# Patient Record
Sex: Female | Born: 1960 | Race: White | Hispanic: No | Marital: Single | Smoking: Former smoker
Health system: Southern US, Community
[De-identification: ages and names within clinical notes are randomized; demographics above are authoritative.]

## PROBLEM LIST (undated history)

## (undated) DIAGNOSIS — R51 Headache: Secondary | ICD-10-CM

## (undated) DIAGNOSIS — Z973 Presence of spectacles and contact lenses: Secondary | ICD-10-CM

## (undated) DIAGNOSIS — R519 Headache, unspecified: Secondary | ICD-10-CM

## (undated) DIAGNOSIS — K449 Diaphragmatic hernia without obstruction or gangrene: Secondary | ICD-10-CM

## (undated) DIAGNOSIS — F419 Anxiety disorder, unspecified: Secondary | ICD-10-CM

## (undated) DIAGNOSIS — C801 Malignant (primary) neoplasm, unspecified: Secondary | ICD-10-CM

## (undated) DIAGNOSIS — K219 Gastro-esophageal reflux disease without esophagitis: Secondary | ICD-10-CM

## (undated) DIAGNOSIS — D649 Anemia, unspecified: Secondary | ICD-10-CM

## (undated) DIAGNOSIS — K5790 Diverticulosis of intestine, part unspecified, without perforation or abscess without bleeding: Secondary | ICD-10-CM

## (undated) DIAGNOSIS — IMO0001 Reserved for inherently not codable concepts without codable children: Secondary | ICD-10-CM

## (undated) HISTORY — PX: OTHER SURGICAL HISTORY: SHX169

## (undated) HISTORY — PX: WISDOM TOOTH EXTRACTION: SHX21

## (undated) HISTORY — DX: Diverticulosis of intestine, part unspecified, without perforation or abscess without bleeding: K57.90

---

## 1999-01-11 ENCOUNTER — Encounter: Admission: RE | Admit: 1999-01-11 | Discharge: 1999-04-11 | Payer: Self-pay | Admitting: Anesthesiology

## 2002-10-13 ENCOUNTER — Other Ambulatory Visit: Admission: RE | Admit: 2002-10-13 | Discharge: 2002-10-13 | Payer: Self-pay | Admitting: Obstetrics and Gynecology

## 2002-10-27 ENCOUNTER — Ambulatory Visit (HOSPITAL_COMMUNITY): Admission: RE | Admit: 2002-10-27 | Discharge: 2002-10-27 | Payer: Self-pay | Admitting: Obstetrics and Gynecology

## 2002-10-27 ENCOUNTER — Encounter: Payer: Self-pay | Admitting: Obstetrics and Gynecology

## 2002-10-28 ENCOUNTER — Encounter: Payer: Self-pay | Admitting: Obstetrics and Gynecology

## 2002-10-28 ENCOUNTER — Encounter: Admission: RE | Admit: 2002-10-28 | Discharge: 2002-10-28 | Payer: Self-pay | Admitting: Obstetrics and Gynecology

## 2003-06-10 HISTORY — PX: VAGINAL HYSTERECTOMY: SUR661

## 2003-08-22 ENCOUNTER — Encounter (INDEPENDENT_AMBULATORY_CARE_PROVIDER_SITE_OTHER): Payer: Self-pay | Admitting: Specialist

## 2003-08-22 ENCOUNTER — Observation Stay (HOSPITAL_COMMUNITY): Admission: RE | Admit: 2003-08-22 | Discharge: 2003-08-23 | Payer: Self-pay | Admitting: Obstetrics and Gynecology

## 2003-09-08 ENCOUNTER — Inpatient Hospital Stay (HOSPITAL_COMMUNITY): Admission: EM | Admit: 2003-09-08 | Discharge: 2003-09-12 | Payer: Self-pay | Admitting: Obstetrics and Gynecology

## 2003-12-22 ENCOUNTER — Ambulatory Visit (HOSPITAL_COMMUNITY): Admission: RE | Admit: 2003-12-22 | Discharge: 2003-12-22 | Payer: Self-pay | Admitting: Family Medicine

## 2005-06-09 HISTORY — PX: CHOLECYSTECTOMY: SHX55

## 2006-10-23 ENCOUNTER — Ambulatory Visit (HOSPITAL_COMMUNITY): Admission: RE | Admit: 2006-10-23 | Discharge: 2006-10-23 | Payer: Self-pay | Admitting: Surgery

## 2006-10-23 ENCOUNTER — Encounter (INDEPENDENT_AMBULATORY_CARE_PROVIDER_SITE_OTHER): Payer: Self-pay | Admitting: Surgery

## 2010-10-22 NOTE — Op Note (Signed)
Brianna Vasquez, Brianna Vasquez                ACCOUNT NO.:  [id number]   MEDICAL RECORD NO.:  [id number]          PATIENT TYPE:  AMB   LOCATION:  DAY                          FACILITY:  [facility]   PHYSICIAN:  Ardeth Sportsman, MD     DATE OF BIRTH:  [birth year]   DATE OF PROCEDURE:  10/23/2006  DATE OF DISCHARGE:                               OPERATIVE REPORT   Primary care physician is Tammy R. Collins Scotland, M.D.   SURGEON:  Ardeth Sportsman, MD   ASSISTANT:  None.   PREOPERATIVE DIAGNOSIS:  Symptomatic cholecystolithiasis.   POSTOPERATIVE DIAGNOSES:  1. Symptomatic cholecystolithiasis.  2. Possible chronic cholecystitis.   PROCEDURE PERFORMED:  Laparoscopic cholecystectomy with intraoperative  cholangiogram.   ANESTHESIA:  1. General anesthesia.  2. Local anesthetic in a field block around all port sites.   SPECIMENS:  Gallbladder.   DRAINS:  None.   ESTIMATED BLOOD LOSS:  Less than 10 mL.   COMPLICATIONS:  There might have been a subtle serosal burn on the  duodenum.  This was controlled with a figure-of-eight 3-0 Vicryl  imbricating stitch.   INDICATIONS:  [name] is a 50 year old female who has had classic  story for biliary colic and known gallstones.  The anatomy and  physiology of hepatobiliary and pancreatic function was discussed.  The  pathophysiology of symptomatic cholecystolithiasis with its risks of  cholecystitis, gallstone pancreatitis, choledocholithiasis and other  elements were discussed.  Options were discussed and the recommendation  was made for laparoscopic cholecystectomy with intraoperative  cholangiogram.   Risks such as stroke, heart attack, deep venous thrombosis, pulmonary  embolism and death, were discussed.  Risks such as bleeding, need for  transfusion, wound infection, abscess, injury to other organs,  incisional hernia, were discussed.  Risk of bile duct injury with the  need of operative reconstruction, need for internal/external drainage  and other  risks were discussed.  Questions answered and she agreed to  proceed.   OPERATIVE FINDINGS:  She had some moderate omental adhesions and her  duodenum was closely involved with the gallbladder.  Her cholangiogram  was completely normal.  Her gallbladder wall was not massively  thickened.   DESCRIPTION OF PROCEDURE:  Informed consent was confirmed.  The patient  voided just prior to going to the operating room.  She had sequential  compression devices active during the entire case.  She underwent  general anesthesia without difficulty.  She was positioned supine with  both arms tucked.  Her abdomen was prepped and draped in a sterile  fashion.   The patient was positioned in steep reverse Trendelenburg, right side  up.  Abdominal entry was gained through optical entry using a 5 mm 30  degree scope.  Capnoperitoneum to 15 mmHg provided good abdominal  insufflation.  Camera inspection revealed no intra-abdominal injury.  Under direct visualization 5 mm ports were placed in the right flank and  through the umbilicus.  A 10.0 port was placed in the subxiphoid region,  tunneled through the falciform ligament.   The gallbladder fundus was identified, grabbed and laid cephalad toward  the  right shoulder.  Omental attachments were freed primarily with blunt  dissection but there were some dense adhesions that needed a little bit  of cautery as well.  At one point during cautery, the tissues shrunk and  the duodenum was brought up close to it.  There might have been a slight  blanching of the serosa wall.  It was subsequently difficult to tell if  there was definitely a burn injury but just to be sure, I went ahead and  did using laparoscopic intracorporeal suturing and knot-tying, did a 3-0  Vicryl figure-of-eight stitch to help patch the area and protect it by  imbricating the duodenum.  This seemed to help cover up the area well.  There was no leak of blood or bile.   The anteromedial and  posterolateral peritoneal coverage between the  gallbladder and liver were freed off.  A circumferential dissection was  done until only two structures were going from the gallbladder down to  the porta hepatis.  One had a wide branching on it and pulsatile  consistent with the cystic artery.  The other one tapered from the  infundibulum and went straight down into the common bile duct.  This was  the cystic duct.  One clip on the gallbladder side and two clips on the  proximal were made on the cystic artery, and this was transected.  Further skeletonization was done until only the cystic duct remained.  One clip was made on the infundibulum.  A partial cyst ductotomy was  performed.   A 5 French cholangiogram catheter was placed through a right subcostal  stab incision, flushed, and passed into the cystic duct more proximally  toward the common bile duct.  It flushed well.  A cholangiogram was done  using continuous fluoroscopy.  This was done using diluted radiopaque  contrast.  Contrast flowed easily in through a side branch into the  common bile duct up into the right and left intrahepatic chains and down  past the ampulla into the duodenum.  There was no filling defect or  other abnormalities.  This was a normal cholangiogram.  The catheter was  removed.  Three clips were planned on the cystic duct just slightly  proximal to the cyst ductotomy and the cyst ductotomy transection was  completed.   The gallbladder was freed from its many attachments on the liver bed and  removed out the subxiphoid port.  A careful inspection was done and a  nice hemostasis was ensured.  Clips were inspected and nice and intact.  The duodenum was reinspected and again the tissue looked all nice and  viable with no evidence of any significant ash or burn or leak.  Irrigation was done until there was a nice clear return.  The upper three abdominal ports were removed.  The subxiphoid fascial defect was   too small to allow my pinkie to pass, plus it was tunneled within the  falciform ligament, so I felt that it did not require a more aggressive  closure.  Capnoperitoneum was evacuated and the ports were removed.  The  skin was closed using 4-0 Monocryl stitch.   The patient was extubated and sent to the recovery room in stable  condition.  I explained the operative findings to the patient's husband  and family.  Questions were answered and they expressed understanding  and appreciation.  I will have her follow up with me in a couple of  weeks.      Viviann Spare  Dierdre Forth, MD  Electronically Signed     SCG/MEDQ  D:  10/23/2006  T:  10/23/2006  Job:  [id number]   cc:   Tammy R. Collins Scotland, M.D.  Fax: [phone]

## 2010-10-25 NOTE — Discharge Summary (Signed)
NAMECARLOTA, Brianna Vasquez                          ACCOUNT NO.:  [id number]   MEDICAL RECORD NO.:  [id number]                   PATIENT TYPE:  INP   LOCATION:  0481                                 FACILITY:  [facility]   PHYSICIAN:  Laqueta Linden, M.D.                 DATE OF BIRTH:  1960/09/18   DATE OF ADMISSION:  09/08/2003  DATE OF DISCHARGE:  09/12/2003                                 DISCHARGE SUMMARY   CONDITION ON DISCHARGE:  Improved.   DISCHARGE DIAGNOSIS:  Pelvic abscess, clinically responding to treatment.   SECONDARY DIAGNOSES:  1. Status post vaginal hysterectomy on August 22, 2003.  2. Known lumbar spine disk disease.  3. Hiatal hernia with reflux.   PROCEDURES:  1. Pelvic CT scan x2.  2. Barium swallow.   TRANSFUSIONS:  Two units of packed red blood cells.   CONSULTANTS:  1. Pharmacy re-anticoagulation for suspected pelvic thrombophlebitis.  2. Telephone consult with Dr. Charna Elizabeth, gastroenterology.   HISTORY OF PRESENT ILLNESS:  Brianna Vasquez is a 50 year old, gravida 2, para  2, who underwent an uncomplicated vaginal hysterectomy on August 22, 2003 as  an outpatient at [facility] for menorrhagia.  She had an  uncomplicated postoperative course until August 28, 2003, when she presented  with fever and lower abdominal discomfort.  She was noted to have a 3.5 cm  cuff cystic mass, consistent with a small abscess, and was placed on p.o.  Cipro.  She had marked improvement of her symptoms with normalization of her  temperatures and decrease in pain.  She subsequently had a flare of her  symptoms, and spiked fevers to 102 and 103 after stopping the Cipro after 10  days.  She was seen and evaluated by Dr. Kyra Manges, and was admitted for  further inpatient management.   HOSPITAL COURSE:  She was admitted and underwent a pelvic CT scan, which  confirmed the presence of an abscess that did not appear to be appreciably  larger than what it was on the prior  ultrasound.  She did have some edema  around the rectosigmoid, consistent with the location of the abscess.  There  were no other abnormalities identified.  She was put on IV Unasyn.  She was  also started on Lovenox for the possibility of septic pelvic  thrombophlebitis.  Her temperatures gradually defervesced over the next 48  hours.  On hospital day #3, she was feeling much better, and maximum  temperature elevations were 100.8.  Her belly was soft and nontender,  although pelvic examination was not repeated.  She did have a follow up CT  scan which showed an actual decrease in the size of the abscess, although  the inflammatory changes of the surrounding soft tissues had increased  somewhat.  Her CBC was monitored, and her white count, which had been 22 on  admission, normalized to a white count of  10.0 on the day of discharge.  She  maintained a thrombocytosis, which was felt to be related to acute  infection.  Her hemoglobin remained stable, around 8; however, she reported  that she felt very weak and dizzy when menstrual period to ambulate, and her  pulse remained in the 100 to 110 range.  After extensive discussion with the  patient about risks and benefits, she elected to proceed with transfusion,  and received 2 units of packed red blood cells without incident.  Follow up  hemoglobin revealed a hemoglobin of 10.0, hematocrit of 30.7.  Platelets  remained elevated at 893, and this was anticipated to revert to normal upon  further diminution in her pelvic inflammation.  On postoperative day #4, she  was actually doing very well, had a temperature less than 99.7 for greater  than 24 hours, her pulse remained less than 100, and she was able to  ambulate.  However, she was quite concerned about difficulty swallowing and  feeling like there was something stuck in her throat.  After a phone  conversation with Dr. Charna Elizabeth, the patient underwent a barium swallow  which showed no  evidence of obstruction, edema, spasm, or inflammation, but  did show a significant hiatal hernia with reflux.  Dr. Loreta Ave recommended that  the patient be started on Protonix with follow up as an outpatient.  In the  meantime, the patient has been switched to liquid Augmentin, as well as  liquid Lortab elixir for her pain, and was tolerating those, as well as  ambulating and tolerating a liquid and a soft diet.  She was therefore sent  home the afternoon of September 12, 2003 to continue a 10-day course of  Augmentin, to continue her Protonix, and with GI follow up to be arranged as  an outpatient.   CONDITION ON DISCHARGE:  She was discharged home in improved condition.   DISCHARGE INSTRUCTIONS:  She was given routine verbal and written discharge  instructions.   FOLLOW UP:  She is to follow up in the office in 3 days time.  She is to  follow up in the office sooner for worsening pain, fever, bleeding, or  inability to tolerate her oral liquid medications or any other concerns.   MEDICATIONS:  Prescriptions were called to her pharmacy including -  1. Augmentin suspension 900 mg q.12h. for 10 days.  2. Protonix 40 mg daily.  3. Lortab elixir, dispense 200 cc, 1-2 teaspoons q.4-6h. p.r.n. pain with 1     refill.                                               Laqueta Linden, M.D.    LKS/MEDQ  D:  09/27/2003  T:  09/27/2003  Job:  [id number]

## 2010-10-25 NOTE — Op Note (Signed)
NAME:  Brianna, Sadiyah Vasquez                          ACCOUNT NO.:  [id number]   MEDICAL RECORD NO.:  [id number]                   PATIENT TYPE:  OBV   LOCATION:  9308                                 FACILITY:  [facility]   PHYSICIAN:  Laqueta Linden, M.D.                 DATE OF BIRTH:  1960-07-31   DATE OF PROCEDURE:  08/22/2003  DATE OF DISCHARGE:                                 OPERATIVE REPORT   PREOPERATIVE DIAGNOSES:  1. Submucosal fibroid/endometrial polyp.  2. Menorrhagia.  3. Anemia.   POSTOPERATIVE DIAGNOSES:  1. Submucosal fibroid/endometrial polyp.  2. Menorrhagia.  3. Anemia.   PROCEDURE:  Transvaginal hysterectomy with coring, morcellation, myomectomy  (300 g).   SURGEON:  Laqueta Linden, M.D.   ASSISTANT:  Andres Ege, M.D.   ANESTHESIA:  General endotracheal.   ESTIMATED BLOOD LOSS:  Less than 100 mL.   FLUIDS:  1 liter of crystalloid.   URINE OUTPUT:  25 mL in-and-out catheterization at the conclusion of the  procedure.   COUNTS:  Correct x 2.   COMPLICATIONS:  None.   INDICATIONS:  Brianna Vasquez is a 50 year old, gravida 3, para 2, female with  profuse menorrhagia with anemia, noted to have a 4 cm endometrial polyp and  a large 6 cm fibroid, felt to be the source of her bleeding.  Attempted  medical management with combination pills, progesterone only pills, and high  dose Megace was unsuccessful.  The patient presents now for definitive  surgical management.  She has been extensively counseled and seen the  informed consent film regarding risks, benefits, alternatives, and  complications of the procedure including but not limited to anesthesia risk;  infection; bleeding possibly requiring transfusion; injury to bowel,  bladder, ureters, vessels, nerves; the possibility of fistula formation;  other postoperative complications including DVT; PE; pneumonia; other  unknown risks as well as permanent and irreversible sterilization; the  possibility for  removal of one or both ovaries depending on intraoperative  findings; and possible need for a laparotomy to complete the procedure due  to the large fibroid as well as postoperative expectations regarding return  to work, sexual functioning, and natural menopause if her ovaries are left  in place.  All questions were answered.  Full consent was given, and she  received Ancef 1 g IV 30 minutes preoperatively.   DESCRIPTION OF PROCEDURE:  The patient was taken to the operating room and  after proper identification and consents were ascertained, she was placed on  the operating room table in the supine position.  Prior to induction of  general endotracheal anesthesia, her legs were positioned in the Union  stirrups and were elevated for the surgery.  The patient remained  comfortable throughout.  This was done in this fashion, as the patient has a  history of herniated disk at L4-5.  Once the patient was positioned  appropriately and comfortably, she  was then placed under general  endotracheal anesthesia.  The perineum and vagina were prepped and draped in  a routine sterile fashion, and the bladder was emptied with a red rubber  catheter.  A weighted speculum was placed in the posterior vagina and the  cervix grasped with a single-tooth tenaculum.  The portio was then injected  with 16 mL of 1:100,000 epinephrine.  The cervix was then circumscribed with  a scalpel, and the vaginal mucosa advanced off of the cervix using a finger  to Ray-Tec sponge.  The posterior vaginal mucosa was then tented down and  the cul-de-sac entered sharply with curved Mayo scissors.  The uterosacral  ligaments were then clamped, cut, Heaney ligated and tagged with 0 Vicryl  suture.  Cardinal ligaments were similarly clamped, cut, and suture ligated  with 0 Vicryl.  The anterior peritoneal reflection was then identified and  entered sharply without obvious injury or entry into the bladder.  Uterine  vessels were  then clamped bilaterally, incorporating both the anterior and  posterior peritoneum with pedicles cut and suture ligated.  At this point,  it was felt that the large fundal fibroid was obstructions further trine  descent as the vessels had been already ligated.  A coring procedure was  then performed.  The edges of the lower uterine segment were grasped with  Kocher clamps as the cervix was cored out of the uterine body.  At this  point, a large fibroid protruded into the uterine fundus, virtually filling  the fundus.  Morcellation and myomectomy was then performed with marked  debulking of the uterine fundus.  At the conclusion of the evacuation of the  fibroid, curved Heaney clamps were then placed across the proximal uterine  ovarian ligament, fallopian tube, and round ligament with excision of the  specimen.  The specimen was immediately weighed in the operating room and  weighed 300 g.  It was sent to pathology for final sectioning.  The tubes  and ovaries appeared normal bilaterally.  The adnexal pedicles were doubly  ligated with a free tie and then a stitch of 0 Vicryl.  Hemostasis was noted  to be excellent.  There was some oozing from the posterior vaginal cuff.  The posterior vaginal cuff was then roofed to the posterior peritoneum in a  running lock fashion using a 0 Vicryl suture with an excellent hemostasis  noted.  A McCall suture was then placed for the uterosacral ligaments with  plication of the posterior cul-de-sac peritoneum and out through the  opposite uterosacral ligament.  This was tied at the very end of the case to  prevent enterocele formation.  The parietal peritoneum was then closed in a  pursestring fashion using 2-0 Vicryl suture.  The adnexal pedicles were  visualized and hemostatic, and the pedicles were then cut and released into  the peritoneal cavity.  There was a slight amount of oozing from both lateral aspects of the cuff after closure of the peritoneal  cavity.  Counts  were correct prior to closure of the peritoneum.  The vaginal cuff was then  closed from side-to-side using interrupted figure-of-eight sutures of 0  Vicryl.  The slight amount of bleeding noted stopped with placement of  appropriate sutures.  McCall suture was then placed.  Hemostasis was noted  to be excellent.  No packing was left in place. Bladder was then emptied of  25 mL of clear urine that had accumulated since the original catheterization  45 minutes previously.  The patient received 1 liter of crystalloid  throughout the procedure.  Estimated blood loss less than 100 mL.  Complications none.  Counts correct x 2.  She was stable and extubated on  transfer to the PACU.  She will be observed and transferred to the floor for  23 hour observation.  She received Toradol 30 mg IV, 30 mg IM prior to  awakening.                                               Laqueta Linden, M.D.    LKS/MEDQ  D:  08/22/2003  T:  08/22/2003  Job:  [id number]

## 2010-10-25 NOTE — H&P (Signed)
NAME:  Brianna Vasquez, Brianna Vasquez                          ACCOUNT NO.:  [id number]   MEDICAL RECORD NO.:  [id number]                   PATIENT TYPE:  INP   LOCATION:  0481                                 [facility]ACILITY[facility]   PHYSICIAN:  Katherine Roan, M.D.               DATE OF BIRTH:  07-21-1960   DATE OF ADMISSION:  09/08/2003  DATE OF DISCHARGE:                                HISTORY & PHYSICAL   CHIEF COMPLAINT:  Abdominal pain.   HISTORY OF PRESENT ILLNESS:  Brianna Vasquez is a 50 year old, gravida 2, para 2  female who underwent a vaginal hysterectomy at [facility] on March 15.  Surgery was done for menorrhagia persistent despite hormonal manipulation.  At the time of surgery, there was a large uterine fibroid and the uterus had  to be morcellated to be removed. This was done without incident.  She was  doing well until on August 28, 2003 and was seen in the office with fever and  lower abdominal discomfort, was placed on Cipro and did well until last  night when she started having chills and fever and low back pain that just  did not feel good.  She describes an aching in her lower pelvis. She denies  nausea, vomiting or diarrhea. She states her bowel movements are  uncomfortable since her surgery.  She does not complain of dysuria and her  appetites are not good.   PAST MEDICAL HISTORY:  She had two uncomplicated births, Pap smears have  been normal.   REVIEW OF SYMPTOMS:  HEENT:  No dizziness, headaches, no decreased vision or  auditory acuity.  PULMONARY:  No chronic cough, no asthma. CARDIOVASCULAR:  No hypertension, no chest pain, no history of mitral valve prolapse. GU:  She denies stress incontinence.  GI:  No bowel habit change, no melena.   FAMILY HISTORY:  Not remarkable for diabetes or malignant disease. Her  mother is living and well. Her mother was a Producer, television/film/video.   PHYSICAL EXAMINATION:  VITAL SIGNS:  Reveals a temperature os 100.6.  Pulse  is 120, blood pressure is  130/80.  GENERAL:  Pleasant, well-developed, well-nourished female who is alert and  oriented to time, place and recent vents.  HEENT:  Unremarkable.  Oropharynx is not injected.  NECK:  Supple. Carotid pulses are equal without bruits.  LUNGS:  Clear to auscultation and percussion.  HEART:  Normal sinus rhythm, no murmurs.  BREASTS:  No masses or tenderness.  ABDOMEN:  Soft.  Liver, spleen and kidneys are not palpated. There is no  tenderness or guarding, no rebound. Bowel sounds are appropriate.  EXTREMITIES:  Show good range of motion, equal pulses and reflexes.  PELVIC:  Reveals induration in the pelvis and tenderness. I cannot palpate  an adnexal mass.   IMPRESSION:  Probable pelvic cellulitis versus abscess.   PLAN:  IV antibiotics and CT of abdomen.  Katherine Roan, M.D.    SDM/MEDQ  D:  09/08/2003  T:  09/09/2003  Job:  [id number]   cc:   Laqueta Linden, M.D.  [address]  [place]  [place] [address]  Fax: [phone]

## 2011-01-17 ENCOUNTER — Emergency Department (HOSPITAL_COMMUNITY): Payer: Self-pay

## 2011-01-17 ENCOUNTER — Emergency Department (HOSPITAL_COMMUNITY)
Admission: EM | Admit: 2011-01-17 | Discharge: 2011-01-17 | Disposition: A | Discharge status: Home / Self Care | Payer: Self-pay | Attending: Emergency Medicine | Admitting: Emergency Medicine

## 2011-01-17 DIAGNOSIS — K219 Gastro-esophageal reflux disease without esophagitis: Secondary | ICD-10-CM | POA: Insufficient documentation

## 2011-01-17 DIAGNOSIS — R110 Nausea: Secondary | ICD-10-CM | POA: Insufficient documentation

## 2011-01-17 DIAGNOSIS — R0789 Other chest pain: Secondary | ICD-10-CM | POA: Insufficient documentation

## 2011-01-17 DIAGNOSIS — R0602 Shortness of breath: Secondary | ICD-10-CM | POA: Insufficient documentation

## 2011-01-17 LAB — COMPREHENSIVE METABOLIC PANEL
AST: 17 U/L (ref 0–37)
Albumin: 3.9 g/dL (ref 3.5–5.2)
Alkaline Phosphatase: 71 U/L (ref 39–117)
BUN: 10 mg/dL (ref 6–23)
Creatinine, Ser: 0.61 mg/dL (ref 0.50–1.10)
GFR calc non Af Amer: >60 mL/min (ref >60)
Total Bilirubin: 0.5 mg/dL (ref 0.3–1.2)

## 2011-01-17 LAB — CBC
Hemoglobin: 13.7 g/dL (ref 12.0–15.0)
MCH: 30.4 pg (ref 26.0–34.0)
MCHC: 33.9 g/dL (ref 30.0–36.0)
MCV: 89.6 fL (ref 78.0–100.0)
Platelets: 403 10*3/uL — ABNORMAL HIGH (ref 150–400)
RBC: 4.51 MIL/uL (ref 3.87–5.11)

## 2013-09-05 ENCOUNTER — Other Ambulatory Visit: Payer: Self-pay

## 2013-09-05 DIAGNOSIS — Z1231 Encounter for screening mammogram for malignant neoplasm of breast: Secondary | ICD-10-CM

## 2013-09-13 ENCOUNTER — Ambulatory Visit: Payer: Self-pay

## 2013-09-13 ENCOUNTER — Other Ambulatory Visit: Payer: Self-pay | Admitting: Family Medicine

## 2013-09-13 ENCOUNTER — Ambulatory Visit
Admission: RE | Admit: 2013-09-13 | Discharge: 2013-09-13 | Disposition: A | Discharge status: Home / Self Care | Payer: BC Managed Care – PPO | Source: Ambulatory Visit

## 2013-09-13 DIAGNOSIS — N6310 Unspecified lump in the right breast, unspecified quadrant: Secondary | ICD-10-CM

## 2013-09-13 DIAGNOSIS — Z1231 Encounter for screening mammogram for malignant neoplasm of breast: Secondary | ICD-10-CM

## 2013-09-20 ENCOUNTER — Other Ambulatory Visit: Payer: Self-pay | Admitting: Radiology

## 2013-10-07 ENCOUNTER — Ambulatory Visit (INDEPENDENT_AMBULATORY_CARE_PROVIDER_SITE_OTHER): Payer: BC Managed Care – PPO | Admitting: General Surgery

## 2013-10-07 ENCOUNTER — Encounter (INDEPENDENT_AMBULATORY_CARE_PROVIDER_SITE_OTHER): Payer: Self-pay | Admitting: General Surgery

## 2013-10-07 VITALS — BP 154/94 | HR 84 | Temp 97.8°F | Resp 12 | Ht 65.0 in | Wt 200.8 lb

## 2013-10-07 DIAGNOSIS — N63 Unspecified lump in unspecified breast: Secondary | ICD-10-CM

## 2013-10-07 HISTORY — PX: BREAST LUMPECTOMY: SHX2

## 2013-10-07 NOTE — Progress Notes (Signed)
Patient ID: Brianna Vasquez, female   DOB: [birth year], 53 y.o.   MRN: [id number]  Chief Complaint  Patient presents with  . New Evaluation    eval Rt Br papilloma    HPI Brianna Vasquez is a 53 y.o. female.  Referred by Dr Florina Ou HPI 75 yof who underwent screening mm that showed a 2 cm irregular mass with a macrolobulated margin in the right breast at 5 oclock. Breast density category B.  US shows a 2.1 cm lobulated partially solid mass.  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  She has family history of a grandmother, aunt and niece who all have had early breast cancer and the niece she believes was brca 1 positive. She comes in today without any complaints for evaluation.  History reviewed. No pertinent past medical history.  Past Surgical History  Procedure Laterality Date  . Abdominal hysterectomy  2004  . Cholecystectomy  2007    Family History  Problem Relation Age of Onset  . Cancer Father 75    pancreatic  . Cancer Paternal Aunt 51    breast   . Cancer Paternal Grandmother 35    breast/mastectomy    Social History History  Substance Use Topics  . Smoking status: Former Smoker    Quit date: 10/07/1985  . Smokeless tobacco: Not on file  . Alcohol Use: Yes     Comment: occ    No Known Allergies  Current Outpatient Prescriptions  Medication Sig Dispense Refill  . b complex vitamins tablet Take 1 tablet by mouth daily.       No current facility-administered medications for this visit.    Review of Systems Review of Systems  Constitutional: Negative for fever, chills and unexpected weight change.  HENT: Negative for congestion, hearing loss, sore throat, trouble swallowing and voice change.   Eyes: Negative for visual disturbance.  Respiratory: Negative for cough and wheezing.   Cardiovascular: Negative for chest pain, palpitations and leg swelling.  Gastrointestinal: Negative for nausea, vomiting, abdominal pain, diarrhea, constipation, blood in  stool, abdominal distention and anal bleeding.  Genitourinary: Negative for hematuria, vaginal bleeding and difficulty urinating.  Musculoskeletal: Negative for arthralgias.  Skin: Negative for rash and wound.  Neurological: Negative for seizures, syncope and headaches.  Hematological: Negative for adenopathy. Does not bruise/bleed easily.  Psychiatric/Behavioral: Negative for confusion.    Blood pressure 154/94, pulse 84, temperature 97.8 F (36.6 C), resp. rate 12, height _0  (1.651 m), weight 200 lb 12.8 oz (91.082 kg).  Physical Exam Physical Exam  Vitals reviewed. Constitutional: She appears well-developed and well-nourished.  Neck: Neck supple.  Cardiovascular: Normal rate, regular rhythm and normal heart sounds.   Pulmonary/Chest: Effort normal and breath sounds normal. She has no wheezes. She has no rales. Right breast exhibits no inverted nipple, no nipple discharge, no skin change and no tenderness. Left breast exhibits no inverted nipple, no mass, no nipple discharge, no skin change and no tenderness.    Lymphadenopathy:    She has no cervical adenopathy.    Data Reviewed [employer] us/mm and path reviewed  Assessment    Ductal papilloma and mass Family history with brca positivity     Plan    Right breast mass seed guided excisional biopsy  Due to her family history and the presence of this 2 cm right breast mass we discussed an excisional biopsy using radioactive seed guidance. We discussed the indications to rule out a carcinoma. She understands this. We discussed the  risks and benefits of this today.  We will also pending results of surgery plan to refer to discuss with a genetics counselor       Rolm Bookbinder 10/07/2013, 11:43 AM

## 2013-10-19 ENCOUNTER — Encounter (HOSPITAL_BASED_OUTPATIENT_CLINIC_OR_DEPARTMENT_OTHER): Payer: Self-pay | Admitting: *Deleted

## 2013-10-19 NOTE — Progress Notes (Signed)
To come in for [facility] labs  

## 2013-10-24 ENCOUNTER — Encounter (INDEPENDENT_AMBULATORY_CARE_PROVIDER_SITE_OTHER): Payer: Self-pay

## 2013-10-24 ENCOUNTER — Encounter (HOSPITAL_BASED_OUTPATIENT_CLINIC_OR_DEPARTMENT_OTHER)
Admission: RE | Admit: 2013-10-24 | Discharge: 2013-10-24 | Disposition: A | Discharge status: Home / Self Care | Payer: BC Managed Care – PPO | Source: Ambulatory Visit | Attending: General Surgery | Admitting: General Surgery

## 2013-10-24 LAB — CBC WITH DIFFERENTIAL/PLATELET
BASOS ABS: 0.1 10*3/uL (ref 0.0–0.1)
BASOS PCT: 1 % (ref 0–1)
Eosinophils Absolute: 0.2 10*3/uL (ref 0.0–0.7)
Eosinophils Relative: 3 % (ref 0–5)
HCT: 38.5 % (ref 36.0–46.0)
Hemoglobin: 12.9 g/dL (ref 12.0–15.0)
Lymphocytes Relative: 32 % (ref 12–46)
Lymphs Abs: 2.4 10*3/uL (ref 0.7–4.0)
MCH: 30.3 pg (ref 26.0–34.0)
MCHC: 33.5 g/dL (ref 30.0–36.0)
MCV: 90.4 fL (ref 78.0–100.0)
Monocytes Absolute: 0.6 10*3/uL (ref 0.1–1.0)
Monocytes Relative: 8 % (ref 3–12)
NEUTROS PCT: 56 % (ref 43–77)
Neutro Abs: 4.2 10*3/uL (ref 1.7–7.7)
PLATELETS: 336 10*3/uL (ref 150–400)
RBC: 4.26 MIL/uL (ref 3.87–5.11)
RDW: 13.7 % (ref 11.5–15.5)
WBC: 7.5 10*3/uL (ref 4.0–10.5)

## 2013-10-24 LAB — BASIC METABOLIC PANEL
BUN: 9 mg/dL (ref 6–23)
CALCIUM: 9.2 mg/dL (ref 8.4–10.5)
CO2: 25 mEq/L (ref 19–32)
CREATININE: 0.64 mg/dL (ref 0.50–1.10)
Chloride: 103 mEq/L (ref 96–112)
GFR calc non Af Amer: >90 mL/min (ref >90)
Glucose, Bld: 103 mg/dL — ABNORMAL HIGH (ref 70–99)
POTASSIUM: 3.8 meq/L (ref 3.7–5.3)
Sodium: 141 mEq/L (ref 137–147)

## 2013-10-25 ENCOUNTER — Encounter (HOSPITAL_BASED_OUTPATIENT_CLINIC_OR_DEPARTMENT_OTHER)
Admission: RE | Disposition: A | Discharge status: Home / Self Care | Payer: Self-pay | Source: Ambulatory Visit | Attending: General Surgery

## 2013-10-25 ENCOUNTER — Ambulatory Visit (HOSPITAL_BASED_OUTPATIENT_CLINIC_OR_DEPARTMENT_OTHER)
Admission: RE | Admit: 2013-10-25 | Discharge: 2013-10-25 | Disposition: A | Discharge status: Home / Self Care | Payer: BC Managed Care – PPO | Source: Ambulatory Visit | Attending: General Surgery | Admitting: General Surgery

## 2013-10-25 ENCOUNTER — Encounter (HOSPITAL_BASED_OUTPATIENT_CLINIC_OR_DEPARTMENT_OTHER): Payer: BC Managed Care – PPO | Admitting: Anesthesiology

## 2013-10-25 ENCOUNTER — Encounter (HOSPITAL_BASED_OUTPATIENT_CLINIC_OR_DEPARTMENT_OTHER): Payer: Self-pay

## 2013-10-25 ENCOUNTER — Ambulatory Visit (HOSPITAL_BASED_OUTPATIENT_CLINIC_OR_DEPARTMENT_OTHER): Payer: BC Managed Care – PPO | Admitting: Anesthesiology

## 2013-10-25 DIAGNOSIS — D249 Benign neoplasm of unspecified breast: Secondary | ICD-10-CM

## 2013-10-25 DIAGNOSIS — Z803 Family history of malignant neoplasm of breast: Secondary | ICD-10-CM | POA: Insufficient documentation

## 2013-10-25 DIAGNOSIS — N6089 Other benign mammary dysplasias of unspecified breast: Secondary | ICD-10-CM

## 2013-10-25 DIAGNOSIS — Z6833 Body mass index (BMI) 33.0-33.9, adult: Secondary | ICD-10-CM | POA: Insufficient documentation

## 2013-10-25 DIAGNOSIS — K219 Gastro-esophageal reflux disease without esophagitis: Secondary | ICD-10-CM | POA: Insufficient documentation

## 2013-10-25 DIAGNOSIS — Z87891 Personal history of nicotine dependence: Secondary | ICD-10-CM | POA: Insufficient documentation

## 2013-10-25 HISTORY — DX: Presence of spectacles and contact lenses: Z97.3

## 2013-10-25 SURGERY — RADIOACTIVE SEED GUIDED BREAST BIOPSY
Anesthesia: General | Site: Breast | Laterality: Right

## 2013-10-25 MED ORDER — PROMETHAZINE HCL 25 MG/ML IJ SOLN
INTRAMUSCULAR | Status: AC
  Filled 2013-10-25: qty 1

## 2013-10-25 MED ORDER — DEXAMETHASONE SODIUM PHOSPHATE 4 MG/ML IJ SOLN
INTRAMUSCULAR | Status: DC | PRN
  Administered 2013-10-25: 10 mg via INTRAVENOUS

## 2013-10-25 MED ORDER — FENTANYL CITRATE 0.05 MG/ML IJ SOLN
INTRAMUSCULAR | Status: AC
  Filled 2013-10-25: qty 6

## 2013-10-25 MED ORDER — MEPERIDINE HCL 25 MG/ML IJ SOLN: 6.2500 mg | INTRAMUSCULAR | Status: DC | PRN

## 2013-10-25 MED ORDER — BUPIVACAINE HCL (PF) 0.25 % IJ SOLN
INTRAMUSCULAR | Status: DC | PRN
  Administered 2013-10-25: 10 mL

## 2013-10-25 MED ORDER — MIDAZOLAM HCL 2 MG/2ML IJ SOLN
INTRAMUSCULAR | Status: AC
  Filled 2013-10-25: qty 2

## 2013-10-25 MED ORDER — FENTANYL CITRATE 0.05 MG/ML IJ SOLN: 50.0000 ug | INTRAMUSCULAR | Status: DC | PRN

## 2013-10-25 MED ORDER — PROPOFOL 10 MG/ML IV BOLUS
INTRAVENOUS | Status: DC | PRN
  Administered 2013-10-25: 200 mg via INTRAVENOUS

## 2013-10-25 MED ORDER — HYDROCODONE-ACETAMINOPHEN 10-325 MG PO TABS: 1.0000 | ORAL_TABLET | Freq: Four times a day (QID) | ORAL | Status: AC | PRN

## 2013-10-25 MED ORDER — MIDAZOLAM HCL 5 MG/5ML IJ SOLN
INTRAMUSCULAR | Status: DC | PRN
  Administered 2013-10-25: 2 mg via INTRAVENOUS

## 2013-10-25 MED ORDER — HYDROCODONE-ACETAMINOPHEN 5-325 MG PO TABS
1.0000 | ORAL_TABLET | Freq: Four times a day (QID) | ORAL | Status: DC | PRN
  Administered 2013-10-25: 1 via ORAL

## 2013-10-25 MED ORDER — PROMETHAZINE HCL 25 MG/ML IJ SOLN
6.2500 mg | INTRAMUSCULAR | Status: DC | PRN
  Administered 2013-10-25: 12.5 mg via INTRAVENOUS

## 2013-10-25 MED ORDER — HYDROMORPHONE HCL PF 1 MG/ML IJ SOLN
0.2500 mg | INTRAMUSCULAR | Status: DC | PRN
  Administered 2013-10-25 (×2): 0.25 mg via INTRAVENOUS

## 2013-10-25 MED ORDER — MIDAZOLAM HCL 2 MG/2ML IJ SOLN: 0.5000 mg | Freq: Once | INTRAMUSCULAR | Status: DC | PRN

## 2013-10-25 MED ORDER — MIDAZOLAM HCL 2 MG/2ML IJ SOLN: 1.0000 mg | INTRAMUSCULAR | Status: DC | PRN

## 2013-10-25 MED ORDER — CEFAZOLIN SODIUM-DEXTROSE 2-3 GM-% IV SOLR
2.0000 g | INTRAVENOUS | Status: AC
  Administered 2013-10-25: 2 g via INTRAVENOUS

## 2013-10-25 MED ORDER — CEFAZOLIN SODIUM-DEXTROSE 2-3 GM-% IV SOLR
INTRAVENOUS | Status: AC
  Filled 2013-10-25: qty 50

## 2013-10-25 MED ORDER — HYDROCODONE-ACETAMINOPHEN 5-325 MG PO TABS
1.0000 | ORAL_TABLET | Freq: Once | ORAL | Status: DC
Start: 2013-10-25 — End: 2013-10-25

## 2013-10-25 MED ORDER — HYDROCODONE-ACETAMINOPHEN 5-325 MG PO TABS
ORAL_TABLET | ORAL | Status: AC
  Filled 2013-10-25: qty 1

## 2013-10-25 MED ORDER — ONDANSETRON HCL 4 MG/2ML IJ SOLN
INTRAMUSCULAR | Status: DC | PRN
  Administered 2013-10-25: 4 mg via INTRAVENOUS

## 2013-10-25 MED ORDER — FENTANYL CITRATE 0.05 MG/ML IJ SOLN
INTRAMUSCULAR | Status: DC | PRN
  Administered 2013-10-25: 100 ug via INTRAVENOUS
  Administered 2013-10-25 (×2): 50 ug via INTRAVENOUS

## 2013-10-25 MED ORDER — HYDROMORPHONE HCL PF 1 MG/ML IJ SOLN
INTRAMUSCULAR | Status: AC
  Filled 2013-10-25: qty 1

## 2013-10-25 MED ORDER — LIDOCAINE HCL (CARDIAC) 20 MG/ML IV SOLN
INTRAVENOUS | Status: DC | PRN
  Administered 2013-10-25: 50 mg via INTRAVENOUS

## 2013-10-25 MED ORDER — LACTATED RINGERS IV SOLN
INTRAVENOUS | Status: DC
  Administered 2013-10-25: 10 mL/h via INTRAVENOUS
  Administered 2013-10-25: 14:00:00 via INTRAVENOUS

## 2013-10-25 SURGICAL SUPPLY — 60 items
APPLIER CLIP 9.375 MED OPEN (MISCELLANEOUS)
BENZOIN TINCTURE PRP APPL 2/3 (GAUZE/BANDAGES/DRESSINGS) IMPLANT
BINDER BREAST LRG (GAUZE/BANDAGES/DRESSINGS) IMPLANT
BINDER BREAST MEDIUM (GAUZE/BANDAGES/DRESSINGS) IMPLANT
BINDER BREAST XLRG (GAUZE/BANDAGES/DRESSINGS) ×3 IMPLANT
BINDER BREAST XXLRG (GAUZE/BANDAGES/DRESSINGS) IMPLANT
BLADE 15 SAFETY STRL DISP (BLADE) IMPLANT
BLADE SURG 15 STRL LF DISP TIS (BLADE) ×1 IMPLANT
BLADE SURG 15 STRL SS (BLADE) ×2
CANISTER SUC SOCK COL 7IN (MISCELLANEOUS) IMPLANT
CANISTER SUCT 1200ML W/VALVE (MISCELLANEOUS) IMPLANT
CHLORAPREP W/TINT 26ML (MISCELLANEOUS) ×3 IMPLANT
CLIP APPLIE 9.375 MED OPEN (MISCELLANEOUS) IMPLANT
CLOSURE WOUND 1/2 X4 (GAUZE/BANDAGES/DRESSINGS) ×1
COVER MAYO STAND STRL (DRAPES) ×3 IMPLANT
COVER PROBE W GEL 5X96 (DRAPES) ×3 IMPLANT
COVER TABLE BACK 60X90 (DRAPES) ×3 IMPLANT
DECANTER SPIKE VIAL GLASS SM (MISCELLANEOUS) IMPLANT
DERMABOND ADVANCED (GAUZE/BANDAGES/DRESSINGS) ×2
DERMABOND ADVANCED .7 DNX12 (GAUZE/BANDAGES/DRESSINGS) ×1 IMPLANT
DEVICE DUBIN W/COMP PLATE 8390 (MISCELLANEOUS) ×3 IMPLANT
DRAPE PED LAPAROTOMY (DRAPES) ×3 IMPLANT
DRSG TEGADERM 4X4.75 (GAUZE/BANDAGES/DRESSINGS) IMPLANT
ELECT COATED BLADE 2.86 ST (ELECTRODE) ×3 IMPLANT
ELECT REM PT RETURN 9FT ADLT (ELECTROSURGICAL) ×3
ELECTRODE REM PT RTRN 9FT ADLT (ELECTROSURGICAL) ×1 IMPLANT
GLOVE BIO SURGEON STRL SZ 6.5 (GLOVE) ×2 IMPLANT
GLOVE BIO SURGEON STRL SZ7 (GLOVE) ×6 IMPLANT
GLOVE BIO SURGEONS STRL SZ 6.5 (GLOVE) ×1
GLOVE BIOGEL PI IND STRL 7.0 (GLOVE) ×1 IMPLANT
GLOVE BIOGEL PI IND STRL 7.5 (GLOVE) ×1 IMPLANT
GLOVE BIOGEL PI INDICATOR 7.0 (GLOVE) ×2
GLOVE BIOGEL PI INDICATOR 7.5 (GLOVE) ×2
GOWN STRL REUS W/ TWL LRG LVL3 (GOWN DISPOSABLE) ×2 IMPLANT
GOWN STRL REUS W/TWL LRG LVL3 (GOWN DISPOSABLE) ×4
KIT MARKER MARGIN INK (KITS) ×3 IMPLANT
NEEDLE HYPO 25X1 1.5 SAFETY (NEEDLE) ×3 IMPLANT
NS IRRIG 1000ML POUR BTL (IV SOLUTION) IMPLANT
PACK BASIN DAY SURGERY FS (CUSTOM PROCEDURE TRAY) ×3 IMPLANT
PENCIL BUTTON HOLSTER BLD 10FT (ELECTRODE) ×3 IMPLANT
SHEET MEDIUM DRAPE 40X70 STRL (DRAPES) IMPLANT
SLEEVE SCD COMPRESS KNEE MED (MISCELLANEOUS) ×3 IMPLANT
SPONGE GAUZE 4X4 12PLY STER LF (GAUZE/BANDAGES/DRESSINGS) IMPLANT
SPONGE LAP 4X18 X RAY DECT (DISPOSABLE) ×3 IMPLANT
STAPLER VISISTAT 35W (STAPLE) ×3 IMPLANT
STRIP CLOSURE SKIN 1/2X4 (GAUZE/BANDAGES/DRESSINGS) ×2 IMPLANT
SUT MNCRL AB 4-0 PS2 18 (SUTURE) IMPLANT
SUT MON AB 5-0 PS2 18 (SUTURE) ×3 IMPLANT
SUT SILK 2 0 SH (SUTURE) IMPLANT
SUT VIC AB 2-0 SH 27 (SUTURE) ×2
SUT VIC AB 2-0 SH 27XBRD (SUTURE) ×1 IMPLANT
SUT VIC AB 3-0 SH 27 (SUTURE) ×2
SUT VIC AB 3-0 SH 27X BRD (SUTURE) ×1 IMPLANT
SUT VIC AB 5-0 PS2 18 (SUTURE) IMPLANT
SYR CONTROL 10ML LL (SYRINGE) ×3 IMPLANT
TOWEL OR 17X24 6PK STRL BLUE (TOWEL DISPOSABLE) ×3 IMPLANT
TOWEL OR NON WOVEN STRL DISP B (DISPOSABLE) IMPLANT
TUBE CONNECTING 20'X1/4 (TUBING)
TUBE CONNECTING 20X1/4 (TUBING) IMPLANT
YANKAUER SUCT BULB TIP NO VENT (SUCTIONS) IMPLANT

## 2013-10-25 NOTE — Discharge Instructions (Signed)
[facility] °Office Phone Number [phone] ° °BREAST BIOPSY/ PARTIAL MASTECTOMY: POST OP INSTRUCTIONS ° °Always review your discharge instruction sheet given to you by the facility where your surgery was performed. ° °IF YOU HAVE DISABILITY OR FAMILY LEAVE FORMS, YOU MUST BRING THEM TO THE OFFICE FOR PROCESSING.  DO NOT GIVE THEM TO YOUR DOCTOR. ° °1. A prescription for pain medication may be given to you upon discharge.  Take your pain medication as prescribed, if needed.  If narcotic pain medicine is not needed, then you may take acetaminophen (Tylenol), naprosyn (Alleve) or ibuprofen (Advil) as needed. °2. Take your usually prescribed medications unless otherwise directed °3. If you need a refill on your pain medication, please contact your pharmacy.  They will contact our office to request authorization.  Prescriptions will not be filled after 5pm or on week-ends. °4. You should eat very light the first 24 hours after surgery, such as soup, crackers, pudding, etc.  Resume your normal diet the day after surgery. °5. Most patients will experience some swelling and bruising in the breast.  Ice packs and a good support bra will help.  Wear the breast binder provided or a sports bra for 72 hours day and night.  After that wear a sports bra during the day until you return to the office. Swelling and bruising can take several days to resolve.  °6. It is common to experience some constipation if taking pain medication after surgery.  Increasing fluid intake and taking a stool softener will usually help or prevent this problem from occurring.  A mild laxative (Milk of Magnesia or Miralax) should be taken according to package directions if there are no bowel movements after 48 hours. °7. Unless discharge instructions indicate otherwise, you may remove your bandages 48 hours after surgery and you may shower at that time.  You may have steri-strips (small skin tapes) in place directly over the incision.   These strips should be left on the skin for 7-10 days and will come off on their own.  If your surgeon used skin glue on the incision, you may shower in 24 hours.  The glue will flake off over the next 2-3 weeks.  Any sutures or staples will be removed at the office during your follow-up visit. °8. ACTIVITIES:  You may resume regular daily activities (gradually increasing) beginning the next day.  Wearing a good support bra or sports bra minimizes pain and swelling.  You may have sexual intercourse when it is comfortable. °a. You may drive when you no longer are taking prescription pain medication, you can comfortably wear a seatbelt, and you can safely maneuver your car and apply brakes. °b. RETURN TO WORK:  ______________________________________________________________________________________ °9. You should see your doctor in the office for a follow-up appointment approximately two weeks after your surgery.  Your doctor’s nurse will typically make your follow-up appointment when she calls you with your pathology report.  Expect your pathology report 3-4 business days after your surgery.  You may call to check if you do not hear from us after three days. °10. OTHER INSTRUCTIONS: _______________________________________________________________________________________________ _____________________________________________________________________________________________________________________________________ °_____________________________________________________________________________________________________________________________________ °_____________________________________________________________________________________________________________________________________ ° °WHEN TO CALL DR WAKEFIELD: °1. Fever over 101.0 °2. Nausea and/or vomiting. °3. Extreme swelling or bruising. °4. Continued bleeding from incision. °5. Increased pain, redness, or drainage from the incision. ° °The clinic staff is available to  answer your questions during regular business hours.  Please don’t hesitate to call and ask to speak to one of the nurses for   clinical concerns.  If you have a medical emergency, go to the nearest emergency room or call 911.  A surgeon from [facility] is always on call at the hospital. ° °For further questions, please visit [email or web address] ° °Post Anesthesia Home Care Instructions ° °Activity: °Get plenty of rest for the remainder of the day. A responsible adult should stay with you for 24 hours following the procedure.  °For the next 24 hours, DO NOT: °-Drive a car °-Operate machinery °-Drink alcoholic beverages °-Take any medication unless instructed by your physician °-Make any legal decisions or sign important papers. ° °Meals: °Start with liquid foods such as gelatin or soup. Progress to regular foods as tolerated. Avoid greasy, spicy, heavy foods. If nausea and/or vomiting occur, drink only clear liquids until the nausea and/or vomiting subsides. Call your physician if vomiting continues. ° °Special Instructions/Symptoms: °Your throat may feel dry or sore from the anesthesia or the breathing tube placed in your throat during surgery. If this causes discomfort, gargle with warm salt water. The discomfort should disappear within 24 hours. ° °

## 2013-10-25 NOTE — Interval H&P Note (Signed)
History and Physical Interval Note:  10/25/2013 2:31 PM  Brianna Vasquez  has presented today for surgery, with the diagnosis of right breast mass  The various methods of treatment have been discussed with the patient and family. After consideration of risks, benefits and other options for treatment, the patient has consented to  Procedure(s): RADIOACTIVE SEED GUIDED EXCISIONAL BREAST BIOPSY (Right) as a surgical intervention .  The patient's history has been reviewed, patient examined, no change in status, stable for surgery.  I have reviewed the patient's chart and labs.  Questions were answered to the patient's satisfaction.     Rolm Bookbinder

## 2013-10-25 NOTE — H&P (View-Only) (Signed)
Patient ID: Brianna Vasquez, female   DOB: 11/07/1960, 53 y.o.   MRN: [id number]  Chief Complaint  Patient presents with  . New Evaluation    eval Rt Br papilloma    HPI Brianna Vasquez is a 53 y.o. female.  Referred by Dr Tammy Spear HPI 53 yof who underwent screening mm that showed a 2 cm irregular mass with a macrolobulated margin in the right breast at 5 oclock. Breast density category B.  US shows a 2.1 cm lobulated partially solid mass.  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  She has family history of a grandmother, aunt and niece who all have had early breast cancer and the niece she believes was brca 1 positive. She comes in today without any complaints for evaluation.  History reviewed. No pertinent past medical history.  Past Surgical History  Procedure Laterality Date  . Abdominal hysterectomy  2004  . Cholecystectomy  2007    Family History  Problem Relation Age of Onset  . Cancer Father 45    pancreatic  . Cancer Paternal Aunt 48    breast   . Cancer Paternal Grandmother 55    breast/mastectomy    Social History History  Substance Use Topics  . Smoking status: Former Smoker    Quit date: 10/07/1985  . Smokeless tobacco: Not on file  . Alcohol Use: Yes     Comment: occ    No Known Allergies  Current Outpatient Prescriptions  Medication Sig Dispense Refill  . b complex vitamins tablet Take 1 tablet by mouth daily.       No current facility-administered medications for this visit.    Review of Systems Review of Systems  Constitutional: Negative for fever, chills and unexpected weight change.  HENT: Negative for congestion, hearing loss, sore throat, trouble swallowing and voice change.   Eyes: Negative for visual disturbance.  Respiratory: Negative for cough and wheezing.   Cardiovascular: Negative for chest pain, palpitations and leg swelling.  Gastrointestinal: Negative for nausea, vomiting, abdominal pain, diarrhea, constipation, blood in  stool, abdominal distention and anal bleeding.  Genitourinary: Negative for hematuria, vaginal bleeding and difficulty urinating.  Musculoskeletal: Negative for arthralgias.  Skin: Negative for rash and wound.  Neurological: Negative for seizures, syncope and headaches.  Hematological: Negative for adenopathy. Does not bruise/bleed easily.  Psychiatric/Behavioral: Negative for confusion.    Blood pressure 154/94, pulse 84, temperature 97.8 F (36.6 C), resp. rate 12, height 5' 5" (1.651 m), weight 200 lb 12.8 oz (91.082 kg).  Physical Exam Physical Exam  Vitals reviewed. Constitutional: She appears well-developed and well-nourished.  Neck: Neck supple.  Cardiovascular: Normal rate, regular rhythm and normal heart sounds.   Pulmonary/Chest: Effort normal and breath sounds normal. She has no wheezes. She has no rales. Right breast exhibits no inverted nipple, no nipple discharge, no skin change and no tenderness. Left breast exhibits no inverted nipple, no mass, no nipple discharge, no skin change and no tenderness.    Lymphadenopathy:    She has no cervical adenopathy.    Data Reviewed [employer] us/mm and path reviewed  Assessment    Ductal papilloma and mass Family history with brca positivity     Plan    Right breast mass seed guided excisional biopsy  Due to her family history and the presence of this 2 cm right breast mass we discussed an excisional biopsy using radioactive seed guidance. We discussed the indications to rule out a carcinoma. She understands this. We discussed the   risks and benefits of this today.  We will also pending results of surgery plan to refer to discuss with a genetics counselor       Brianna Vasquez 10/07/2013, 11:43 AM    

## 2013-10-25 NOTE — Anesthesia Postprocedure Evaluation (Signed)
  Anesthesia Post-op Note  Patient: Brianna Vasquez  Procedure(s) Performed: Procedure(s): RADIOACTIVE SEED GUIDED EXCISIONAL BREAST BIOPSY (Right)  Patient Location: PACU  Anesthesia Type:General  Level of Consciousness: awake, alert , oriented and patient cooperative  Airway and Oxygen Therapy: Patient Spontanous Breathing  Post-op Pain: none  Post-op Assessment: Post-op Vital signs reviewed, Patient's Cardiovascular Status Stable, Respiratory Function Stable, Patent Airway, No signs of Nausea or vomiting and Pain level controlled  Post-op Vital Signs: Reviewed and stable  Last Vitals:  Filed Vitals:   10/25/13 1645  BP: 152/83  Pulse: 86  Temp: 36.4 C  Resp: 16    Complications: No apparent anesthesia complications

## 2013-10-25 NOTE — Anesthesia Procedure Notes (Signed)
Procedure Name: LMA Insertion Date/Time: 10/25/2013 2:53 PM Performed by: Melynda Ripple D Pre-anesthesia Checklist: Patient identified, Emergency Drugs available, Suction available and Patient being monitored Patient Re-evaluated:Patient Re-evaluated prior to inductionOxygen Delivery Method: Circle System Utilized Preoxygenation: Pre-oxygenation with 100% oxygen Intubation Type: IV induction Ventilation: Mask ventilation without difficulty LMA: LMA inserted LMA Size: 4.0 Number of attempts: 1 Airway Equipment and Method: bite block Placement Confirmation: positive ETCO2 Tube secured with: Tape Dental Injury: Teeth and Oropharynx as per pre-operative assessment

## 2013-10-25 NOTE — Anesthesia Preprocedure Evaluation (Addendum)
Anesthesia Evaluation  Patient identified by MRN, date of birth, ID band Patient awake    Reviewed: Allergy & Precautions, H&P , NPO status , Patient's Chart, lab work & pertinent test results  History of Anesthesia Complications Negative for: history of anesthetic complications  Airway Mallampati: II TM Distance: >3 FB Neck ROM: Full    Dental  (+) Missing, Dental Advisory Given   Pulmonary former smoker (quit '87),  breath sounds clear to auscultation  Pulmonary exam normal       Cardiovascular - anginanegative cardio ROS  Rate:Normal     Neuro/Psych negative neurological ROS     GI/Hepatic Neg liver ROS, GERD-  Medicated and Controlled,  Endo/Other  Morbid obesity  Renal/GU negative Renal ROS     Musculoskeletal   Abdominal (+) + obese,   Peds  Hematology negative hematology ROS (+)   Anesthesia Other Findings   Reproductive/Obstetrics                          Anesthesia Physical Anesthesia Plan  ASA: II  Anesthesia Plan: General   Post-op Pain Management:    Induction: Intravenous  Airway Management Planned: LMA  Additional Equipment:   Intra-op Plan:   Post-operative Plan:   Informed Consent: I have reviewed the patients History and Physical, chart, labs and discussed the procedure including the risks, benefits and alternatives for the proposed anesthesia with the patient or authorized representative who has indicated his/her understanding and acceptance.   Dental advisory given  Plan Discussed with: CRNA and Surgeon  Anesthesia Plan Comments: (Plan routine monitors, GA- LMA OK)        Anesthesia Quick Evaluation

## 2013-10-25 NOTE — Transfer of Care (Signed)
Immediate Anesthesia Transfer of Care Note  Patient: Brianna Vasquez  Procedure(s) Performed: Procedure(s): RADIOACTIVE SEED GUIDED EXCISIONAL BREAST BIOPSY (Right)  Patient Location: PACU  Anesthesia Type:General  Level of Consciousness: awake and alert   Airway & Oxygen Therapy: Patient Spontanous Breathing and Patient connected to face mask oxygen  Post-op Assessment: Report given to PACU RN and Post -op Vital signs reviewed and stable  Post vital signs: Reviewed and stable  Complications: No apparent anesthesia complications

## 2013-10-25 NOTE — Op Note (Signed)
Preoperative diagnosis: Right breast mass Postoperative diagnosis: Same as above Procedure: Right breast mass radioactive seed guided excisional biopsy Surgeon: Dr. Serita Grammes Anesthesia: Gen. Estimated blood loss: Minimal Drains: None Complications: None Specimens: Right breast mass marked with 8 Sponge needle count correct at completion Disposition to recovery stable  Indications: This is a 53 year old female who had a vague right breast mass on physical examination presented after undergoing mammography and a biopsy that showed a papillary lesion. We discussed excision of this 2 cm mass using radioactive seed guidance.  Procedure: She first had a radioactive seed placed. After informed consent was obtained she was then taken to the operating room. She was given cefazolin. Sequential compression devices were on her legs. She was then placed under general anesthesia without complication. Her right breast was prepped and draped in the standard sterile surgical fashion. A surgical timeout was then performed.  I located the seed with the neoprobe. I then made a periareolar incision. I then removed the seed and the surrounding mass. The seed removal was confirmed by the neoprobe. Faxitron mammogram was taken that confirmed removal of the seed and the clip also.This was confirmed by radiology. This was then sent to pathology. I then obtained hemostasis. The breast tissue was closed with a 2-0 Vicryl. The dermis was closed with 3-0 Vicryl and the skin with 5-0 Monocryl. Dermabond and Steri-Strips are placed. I then infiltrated the tissue with Marcaine. She tolerated this well was extubated and transferred to recovery stable.

## 2013-10-28 ENCOUNTER — Telehealth (INDEPENDENT_AMBULATORY_CARE_PROVIDER_SITE_OTHER): Payer: Self-pay

## 2013-10-28 NOTE — Telephone Encounter (Signed)
Called pt with pathology report showing intraductal papilloma no cancer per Dr Donne Hazel. The pt has a f/u appt with Dr Donne Hazel on 11/10/13.

## 2013-10-28 NOTE — Telephone Encounter (Signed)
Message copied by Illene Regulus on Fri Oct 28, 2013  2:22 PM ------      Message from: [place], [place]: Fri Oct 28, 2013 11:20 AM      Contact: [phone]       Please call pt about her test results ------

## 2013-11-03 ENCOUNTER — Encounter (INDEPENDENT_AMBULATORY_CARE_PROVIDER_SITE_OTHER): Payer: Self-pay

## 2013-11-10 ENCOUNTER — Ambulatory Visit (INDEPENDENT_AMBULATORY_CARE_PROVIDER_SITE_OTHER): Payer: BC Managed Care – PPO | Admitting: General Surgery

## 2013-11-10 ENCOUNTER — Encounter (INDEPENDENT_AMBULATORY_CARE_PROVIDER_SITE_OTHER): Payer: Self-pay | Admitting: General Surgery

## 2013-11-10 VITALS — BP 122/86 | HR 76 | Temp 98.4°F | Resp 16 | Wt 195.6 lb

## 2013-11-10 DIAGNOSIS — Z09 Encounter for follow-up examination after completed treatment for conditions other than malignant neoplasm: Secondary | ICD-10-CM

## 2013-11-10 NOTE — Progress Notes (Signed)
Subjective:     Patient ID: Brianna Vasquez, female   DOB: [birth year], 54 y.o.   MRN: [id number]  HPI This is a 53 year old female who I recently did a right breast excisional biopsy with radioactive seed guidance. She has a significant family history including possible BRCA positivity. 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 She appeared to have a reaction to Steri-Strips to the Dermabond which is now improving.  Review of Systems     Objective:   Physical Exam Healing right breast periareolar incision without infection    Assessment:     Status post right breast excisional biopsy Significant family history and personal history of atypical ductal hyperplasia     Plan:     I told her that I would let someone know that she did have a likely reaction either or both the Dermabond and Steri-Strips if she needs more surgery. This is improving. We discussed massaging incisoin with vitamin e oil  Also refer to the genetics counselor due to her family history. I will also refer her to medical oncology for evaluation for risk reduction I discussed a number of those measures with her today. I released her to full activity

## 2013-11-10 NOTE — Patient Instructions (Signed)

## 2013-11-14 ENCOUNTER — Telehealth: Payer: Self-pay | Admitting: *Deleted

## 2013-11-14 NOTE — Telephone Encounter (Signed)
Confirmed 11/22/13 High Risk appt w/ pt and 11/30/13 genetic appt w/ pt.  Emailed Lars Mage and Dr. Donne Hazel to make them aware.

## 2013-11-17 ENCOUNTER — Other Ambulatory Visit: Payer: Self-pay | Admitting: *Deleted

## 2013-11-17 DIAGNOSIS — N6091 Unspecified benign mammary dysplasia of right breast: Secondary | ICD-10-CM | POA: Insufficient documentation

## 2013-11-21 ENCOUNTER — Telehealth: Payer: Self-pay | Admitting: *Deleted

## 2013-11-21 NOTE — Telephone Encounter (Signed)
Pt called stating that she has some stuff going on at work and cannot make it for her appt tomorrow.  Rescheduled her for 12/06/13.

## 2013-11-22 ENCOUNTER — Encounter: Payer: BC Managed Care – PPO | Admitting: Oncology

## 2013-11-22 ENCOUNTER — Other Ambulatory Visit: Payer: BC Managed Care – PPO

## 2013-11-29 ENCOUNTER — Other Ambulatory Visit: Payer: Self-pay | Admitting: Physician Assistant

## 2013-11-30 ENCOUNTER — Other Ambulatory Visit: Payer: BC Managed Care – PPO

## 2013-12-01 ENCOUNTER — Encounter (INDEPENDENT_AMBULATORY_CARE_PROVIDER_SITE_OTHER): Payer: Self-pay

## 2013-12-01 ENCOUNTER — Telehealth: Payer: Self-pay | Admitting: *Deleted

## 2013-12-01 NOTE — Telephone Encounter (Signed)
Left message for pt to return my call so I can reschedule her appt.

## 2013-12-06 ENCOUNTER — Other Ambulatory Visit: Payer: BC Managed Care – PPO

## 2013-12-06 ENCOUNTER — Encounter: Payer: BC Managed Care – PPO | Admitting: Oncology

## 2013-12-06 ENCOUNTER — Telehealth: Payer: Self-pay | Admitting: *Deleted

## 2013-12-06 NOTE — Telephone Encounter (Signed)
Left message at home for pt to return my call and left a message at her work with Daleen Snook to let her know appt will be cancelled and not to come in and to call me to reschedule.

## 2013-12-07 ENCOUNTER — Telehealth: Payer: Self-pay | Admitting: *Deleted

## 2013-12-07 NOTE — Telephone Encounter (Signed)
Pt left me a voicemail stating that she received my message and would like to get rescheduled to Mid/End Aug.  Left pt a message stating that is fine I can do that and to call me back and I would get her rescheduled.

## 2013-12-15 ENCOUNTER — Telehealth: Payer: Self-pay | Admitting: *Deleted

## 2013-12-15 NOTE — Telephone Encounter (Signed)
Left message for pt to return my call so I can get her rescheduled for her genetics and High Risk appt.

## 2013-12-23 ENCOUNTER — Telehealth: Payer: Self-pay | Admitting: *Deleted

## 2013-12-23 NOTE — Telephone Encounter (Signed)
Left message for a return phone call to schedule patient for genetics and high risk appointment.  Awaiting patient response.

## 2014-01-06 ENCOUNTER — Telehealth: Payer: Self-pay | Admitting: *Deleted

## 2014-01-06 NOTE — Telephone Encounter (Signed)
Left vm for pt to return call to schedule genetics. Contact information given.

## 2014-06-09 HISTORY — PX: COLONOSCOPY: SHX174

## 2015-01-01 ENCOUNTER — Other Ambulatory Visit: Payer: Self-pay | Admitting: Family Medicine

## 2015-01-01 DIAGNOSIS — R1904 Left lower quadrant abdominal swelling, mass and lump: Secondary | ICD-10-CM

## 2015-01-03 ENCOUNTER — Ambulatory Visit
Admission: RE | Admit: 2015-01-03 | Discharge: 2015-01-03 | Disposition: A | Discharge status: Home / Self Care | Payer: BLUE CROSS/BLUE SHIELD | Source: Ambulatory Visit | Attending: Family Medicine | Admitting: Family Medicine

## 2015-01-03 DIAGNOSIS — R1904 Left lower quadrant abdominal swelling, mass and lump: Secondary | ICD-10-CM

## 2015-01-03 MED ORDER — IOPAMIDOL (ISOVUE-300) INJECTION 61%
100.0000 mL | Freq: Once | INTRAVENOUS | Status: AC | PRN
  Administered 2015-01-03: 100 mL via INTRAVENOUS

## 2015-01-05 ENCOUNTER — Encounter: Payer: Self-pay | Admitting: Gynecology

## 2015-01-05 ENCOUNTER — Ambulatory Visit: Payer: BLUE CROSS/BLUE SHIELD | Attending: Gynecology | Admitting: Gynecology

## 2015-01-05 VITALS — BP 163/86 | HR 95 | Temp 98.6°F | Resp 18 | Ht 65.0 in | Wt 179.2 lb

## 2015-01-05 DIAGNOSIS — N949 Unspecified condition associated with female genital organs and menstrual cycle: Secondary | ICD-10-CM

## 2015-01-05 DIAGNOSIS — R1900 Intra-abdominal and pelvic swelling, mass and lump, unspecified site: Secondary | ICD-10-CM | POA: Diagnosis not present

## 2015-01-05 DIAGNOSIS — N9489 Other specified conditions associated with female genital organs and menstrual cycle: Secondary | ICD-10-CM

## 2015-01-05 MED ORDER — ALPRAZOLAM 0.5 MG PO TABS: 0.5000 mg | ORAL_TABLET | Freq: Three times a day (TID) | ORAL | Status: DC | PRN

## 2015-01-05 MED ORDER — HYDROCODONE-IBUPROFEN 7.5-200 MG PO TABS: 1.0000 | ORAL_TABLET | Freq: Four times a day (QID) | ORAL | Status: DC | PRN

## 2015-01-05 NOTE — Progress Notes (Signed)
Consult Note: Gyn-Onc   Brianna Vasquez 54 y.o. female  Chief Complaint  Patient presents with  . 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 Left hydronephrosis secondary to mass.  Plan: I would recommend resection of the mass to relieve the hydronephrosis and to establish a histologic diagnosis. Preoperative chest x-ray tumor markers including CEA and CA-125.  I had a lengthy discussion the patient and her family members indicating that while resection of the mass think is important for palliation and diagnosis we've is still need to deal with the liver and lung metastases using chemotherapy. The patient is aware that surgery may require colonic resection and anastomosis. If the hydronephrosis is not relieved by removal mass then a ureteral stent would likely be necessary.  The risks of surgery including hemorrhage, infection, injury to adjacent viscera, and aesthetic risks, and thromboembolic complications were discussed. Their questions are answered. Surgery is scheduled for August 9. Dr. Ned Clines will be the primary surgeon. The patient is in agreement with this plan.      HPI: 54 year old white female seen in consultation at the request of Dr. Zadie Rhine regarding management of a newly diagnosed left pelvic mass, hydronephrosis, liver metastases, and lung metastases. The patient first noted the mass in the left lower quadrant. CT scan was obtained showing the above-noted findings as well as a enlarged periaortic lymph node. There is no evidence of ascites. Patient previously had a hysterectomy for menorrhagia and uterine fibroids. She has no other significant gynecologic history.  She has a family history of a paternal cousin with breast cancer who has a BRCA mutation. She has a paternal aunt with breast cancer and her father died of pancreatic cancer. The patient her self has not been tested for  BRCA.  The present time the patient has some pain left lower quadrant. She denies any flank pain. She has urinary frequency. She is noted that she has more constipation in recent months where previously she had more diarrhea consistent with irritable bowel syndrome.  The patient had colonoscopy in April and has had mammograms within the past year. Tumor markers are not available today.  Review of Systems:10 point review of systems is negative except as noted in interval history.   Vitals: Blood pressure 163/86, pulse 95, temperature 98.6 F (37 C), temperature source Oral, resp. rate 18, height $RemoveBe'5\' 5"'dVpluyVkj$  (1.651 m), weight 179 lb 3.2 oz (81.285 kg), SpO2 100 %.  Physical Exam: General : The patient is a healthy woman in no acute distress.  HEENT: normocephalic, extraoccular movements normal; neck is supple without thyromegally  Lynphnodes: Supraclavicular and inguinal nodes not enlarged  Abdomen: In general the abdomen is soft although there is a mass in the left mid abdomen. There is no evidence of ascites or hernias. Pelvic:  EGBUS: Normal female  Vagina: Normal, no lesions  Urethra and Bladder: Normal, non-tender  Cervix: Surgically absent  Uterus: Surgically absent  Bi-manual examination: Nontender. There is no mass in the pelvis although the mass is easily palpable in the left mid abdomen.  Rectal: normal sphincter tone, no masses, no blood  Lower extremities: No edema or varicosities. Normal range of motion      Allergies  Allergen Reactions  . Oxycodone Other (See Comments)    Hallucinations     Past Medical History  Diagnosis Date  . Medical history non-contributory   . Wears glasses   .  Diverticulosis     dx 09/2014    Past Surgical History  Procedure Laterality Date  . Wisdom tooth extraction    . Cholecystectomy  2007    lap choli  . Vaginal hysterectomy  2005  . Breast lumpectomy Right 10/2013    Current Outpatient Prescriptions  Medication Sig Dispense  Refill  . b complex vitamins tablet Take 1 tablet by mouth daily.    . Ginger, Zingiber officinalis, (GINGER ROOT PO) Take 1 capsule by mouth daily.    Nyoka Cowden Tea, Camillia sinensis, (GREEN TEA EXTRACT PO) Take 1 capsule by mouth daily.    . Naproxen Sodium (ALEVE PO) Take by mouth as needed.     Marland Kitchen omeprazole (PRILOSEC) 20 MG capsule Take 20 mg by mouth daily.     No current facility-administered medications for this visit.    History   Social History  . Marital Status: Single    Spouse Name: N/A  . Number of Children: N/A  . Years of Education: N/A   Occupational History  . Not on file.   Social History Main Topics  . Smoking status: Former Smoker    Quit date: 10/07/1985  . Smokeless tobacco: Not on file  . Alcohol Use: Yes     Comment: occ  . Drug Use: No  . Sexual Activity: Not on file   Other Topics Concern  . Not on file   Social History Narrative    Family History  Problem Relation Age of Onset  . Cancer Father 21    pancreatic  . Cancer Paternal Aunt 31    breast   . Cancer Paternal Grandmother 42    breast/mastectomy  . Colon cancer Maternal Grandfather   . Alzheimer's disease Paternal Grandfather       Alvino Chapel, MD 01/05/2015, 9:07 AM       Consult Note: Gyn-Onc   Brianna Vasquez 54 y.o. female  Chief Complaint  Patient presents with  . left adnexal mass    New consult    Assessment :  Plan:  Interval History:   HPI:  Review of Systems:10 point review of systems is negative except as noted in interval history.   Vitals: Blood pressure 163/86, pulse 95, temperature 98.6 F (37 C), temperature source Oral, resp. rate 18, height $RemoveBe'5\' 5"'vNUlZlhgA$  (1.651 m), weight 179 lb 3.2 oz (81.285 kg), SpO2 100 %.  Physical Exam: General : The patient is a healthy woman in no acute distress.  HEENT: normocephalic, extraoccular movements normal; neck is supple without thyromegally  Lynphnodes: Supraclavicular and inguinal nodes not  enlarged  Abdomen: Soft, non-tender, no ascites, no organomegally, no masses, no hernias  Pelvic:  EGBUS: Normal female  Vagina: Normal, no lesions  Urethra and Bladder: Normal, non-tender  Cervix: Surgically absent  Uterus: Surgically absent  Bi-manual examination: Non-tender; no adenxal masses or nodularity  Rectal: normal sphincter tone, no masses, no blood  Lower extremities: No edema or varicosities. Normal range of motion      Allergies  Allergen Reactions  . Oxycodone Other (See Comments)    Hallucinations     Past Medical History  Diagnosis Date  . Medical history non-contributory   . Wears glasses   . Diverticulosis     dx 09/2014    Past Surgical History  Procedure Laterality Date  . Wisdom tooth extraction    . Cholecystectomy  2007    lap choli  . Vaginal hysterectomy  2005  . Breast lumpectomy Right 10/2013  Current Outpatient Prescriptions  Medication Sig Dispense Refill  . b complex vitamins tablet Take 1 tablet by mouth daily.    . Ginger, Zingiber officinalis, (GINGER ROOT PO) Take 1 capsule by mouth daily.    Nyoka Cowden Tea, Camillia sinensis, (GREEN TEA EXTRACT PO) Take 1 capsule by mouth daily.    . Naproxen Sodium (ALEVE PO) Take by mouth as needed.     Marland Kitchen omeprazole (PRILOSEC) 20 MG capsule Take 20 mg by mouth daily.     No current facility-administered medications for this visit.    History   Social History  . Marital Status: Single    Spouse Name: N/A  . Number of Children: N/A  . Years of Education: N/A   Occupational History  . Not on file.   Social History Main Topics  . Smoking status: Former Smoker    Quit date: 10/07/1985  . Smokeless tobacco: Not on file  . Alcohol Use: Yes     Comment: occ  . Drug Use: No  . Sexual Activity: Not on file   Other Topics Concern  . Not on file   Social History Narrative    Family History  Problem Relation Age of Onset  . Cancer Father 3    pancreatic  . Cancer Paternal Aunt 52     breast   . Cancer Paternal Grandmother 41    breast/mastectomy  . Colon cancer Maternal Grandfather   . Alzheimer's disease Paternal Grandfather       Alvino Chapel, MD 01/05/2015, 9:07 AM

## 2015-01-05 NOTE — Addendum Note (Signed)
Addended by: Christa See on: 01/05/2015 01:23 PM   Modules accepted: Orders

## 2015-01-05 NOTE — Patient Instructions (Signed)
Preparing for your Surgery  Plan for surgery on August 9 with Dr. Alycia Rossetti.  Pre-operative Testing -You will receive a phone call from presurgical testing at [facility] to arrange for a pre-operative testing appointment before your surgery.  This appointment normally occurs one to two weeks before your scheduled surgery.   -Bring your insurance card, copy of an advanced directive if applicable, medication list  -At that visit, you will be asked to sign a consent for a possible blood transfusion in case a transfusion becomes necessary during surgery.  The need for a blood transfusion is rare but having consent is a necessary part of your care.     -You should not be taking blood thinners or aspirin at least ten days prior to surgery unless instructed by your surgeon.  Day Before Surgery at [facility] will be asked to take in only clear liquids the day before surgery.  Examples of clear liquids include broths, jello, and clear juices.  Avoid carbonated beverages.  You may also be advised to perform a Miralax bowel prep or fleets enema the night before your surgery based off of your provider's recommendations.  You will be advised to have nothing to eat or drink after midnight the evening before.    Your role in recovery Your role is to become active as soon as directed by your doctor, while still giving yourself time to heal.  Rest when you feel tired. You will be asked to do the following in order to speed your recovery:  - Cough and breathe deeply. This helps toclear and expand your lungs and can prevent pneumonia. You may be given a spirometer to practice deep breathing. A staff member will show you how to use the spirometer. - Do mild physical activity. Walking or moving your legs help your circulation and body functions return to normal. A staff member will help you when you try to walk and will provide you with simple exercises. Do not try to get up or walk alone the  first time. - Actively manage your pain. Managing your pain lets you move in comfort. We will ask you to rate your pain on a scale of zero to 10. It is your responsibility to tell your doctor or nurse where and how much you hurt so your pain can be treated.  Special Considerations -If you are diabetic, you may be placed on insulin after surgery to have closer control over your blood sugars to promote healing and recovery.  This does not mean that you will be discharged on insulin.  If applicable, your oral antidiabetics will be resumed when you are tolerating a solid diet.  -Your final pathology results from surgery should be available by the Friday after surgery and the results will be relayed to you when available.  Blood Transfusion Information WHAT IS A BLOOD TRANSFUSION? A transfusion is the replacement of blood or some of its parts. Blood is made up of multiple cells which provide different functions.  Red blood cells carry oxygen and are used for blood loss replacement.  White blood cells fight against infection.  Platelets control bleeding.  Plasma helps clot blood.  Other blood products are available for specialized needs, such as hemophilia or other clotting disorders. BEFORE THE TRANSFUSION  Who gives blood for transfusions?   You may be able to donate blood to be used at a later date on yourself (autologous donation).  Relatives can be asked to donate blood. This is generally not any  safer than if you have received blood from a stranger. The same precautions are taken to ensure safety when a relative's blood is donated.  Healthy volunteers who are fully evaluated to make sure their blood is safe. This is blood bank blood. Transfusion therapy is the safest it has ever been in the practice of medicine. Before blood is taken from a donor, a complete history is taken to make sure that person has no history of diseases nor engages in risky social behavior (examples are intravenous  drug use or sexual activity with multiple partners). The donor's travel history is screened to minimize risk of transmitting infections, such as malaria. The donated blood is tested for signs of infectious diseases, such as HIV and hepatitis. The blood is then tested to be sure it is compatible with you in order to minimize the chance of a transfusion reaction. If you or a relative donates blood, this is often done in anticipation of surgery and is not appropriate for emergency situations. It takes many days to process the donated blood. RISKS AND COMPLICATIONS Although transfusion therapy is very safe and saves many lives, the main dangers of transfusion include:   Getting an infectious disease.  Developing a transfusion reaction. This is an allergic reaction to something in the blood you were given. Every precaution is taken to prevent this. The decision to have a blood transfusion has been considered carefully by your caregiver before blood is given. Blood is not given unless the benefits outweigh the risks.

## 2015-01-05 NOTE — Addendum Note (Signed)
Addended by: Christa See on: 01/05/2015 11:08 AM   Modules accepted: Orders

## 2015-01-08 ENCOUNTER — Other Ambulatory Visit: Payer: Self-pay | Admitting: Gynecologic Oncology

## 2015-01-08 ENCOUNTER — Other Ambulatory Visit: Payer: Self-pay | Admitting: *Deleted

## 2015-01-08 DIAGNOSIS — R1084 Generalized abdominal pain: Secondary | ICD-10-CM

## 2015-01-08 MED ORDER — TRAMADOL HCL 50 MG PO TABS: ORAL_TABLET | ORAL | Status: DC

## 2015-01-08 NOTE — Progress Notes (Signed)
Received call from patient stating that the hydrocodone/ibuprofen prescribed by Dr. Fermin Schwab has made her dizzy when she took it twice over the weekend. Discussed with Joylene John, NP and prescription for tramadol called into patient's pharmacy. Called patient back and notified her of this. She states she has taken tramadol in the past and it worked well for pain for her. Patient agreeable to go and pick-up prescription today and will call our office back with any additional concerns.

## 2015-01-09 ENCOUNTER — Other Ambulatory Visit: Payer: Self-pay | Admitting: Gynecologic Oncology

## 2015-01-09 DIAGNOSIS — R1084 Generalized abdominal pain: Secondary | ICD-10-CM

## 2015-01-09 DIAGNOSIS — R1900 Intra-abdominal and pelvic swelling, mass and lump, unspecified site: Secondary | ICD-10-CM

## 2015-01-09 MED ORDER — OXYCODONE HCL 5 MG PO TABS: 5.0000 mg | ORAL_TABLET | ORAL | Status: DC | PRN

## 2015-01-09 NOTE — Patient Instructions (Addendum)
Brianna Vasquez  01/09/2015   Your procedure is scheduled on: Tuesday January 16, 2015   Report to [facility] Main  Entrance take [place]  elevators to 3rd floor to  [place] at 11:00 AM.  Call this number if you have problems the morning of surgery [phone]   Remember: ONLY 1 PERSON MAY GO WITH YOU TO SHORT STAY TO GET  READY MORNING OF [place].   BEGIN CLEAR LIQUID DIET PER MD INSTRUCTION BEGINNING MIDNIGHT / 12:00 AM ON 01/15/2015. MAY TAKE CLEAR LIQUIDS TILL 7:00 AM DAY OF SURGERY THEN NOTHING BY MOUTH.     Take these medicines the morning of surgery with A SIP OF WATER: Alprazolam (Xanax) if needed; Oxycodone if needed                                You may not have any metal on your body including hair pins and              piercings  Do not wear jewelry, make-up, lotions, powders or perfumes, deodorant             Do not wear nail polish.  Do not shave  48 hours prior to surgery.   Do not bring valuables to the hospital. [place].  Contacts, dentures or bridgework may not be worn into surgery.  Leave suitcase in the car. After surgery it may be brought to your room.                Please read over the following fact sheets you were given:INCENTIVE SPIROMETER; BLOOD TRANSFUSION INFORMATION SHEET  _____________________________________________________________________           [facility] - Preparing for Surgery Before surgery, you can play an important role.  Because skin is not sterile, your skin needs to be as free of germs as possible.  You can reduce the number of germs on your skin by washing with CHG (chlorahexidine gluconate) soap before surgery.  CHG is an antiseptic cleaner which kills germs and bonds with the skin to continue killing germs even after washing. Please DO NOT use if you have an allergy to CHG or antibacterial soaps.  If your skin becomes reddened/irritated stop using the CHG and  inform your nurse when you arrive at Short Stay. Do not shave (including legs and underarms) for at least 48 hours prior to the first CHG shower.  You may shave your face/neck. Please follow these instructions carefully:  1.  Shower with CHG Soap the night before surgery and the  morning of Surgery.  2.  If you choose to wash your hair, wash your hair first as usual with your  normal  shampoo.  3.  After you shampoo, rinse your hair and body thoroughly to remove the  shampoo.                            4.  Use CHG as you would any other liquid soap.  You can apply chg directly  to the skin and wash                       Gently with  a scrungie or clean washcloth.  5.  Apply the CHG Soap to your body ONLY FROM THE NECK DOWN.   Do not use on face/ open                           Wound or open sores. Avoid contact with eyes, ears mouth and genitals (private parts).                       Wash face,  Genitals (private parts) with your normal soap.             6.  Wash thoroughly, paying special attention to the area where your surgery  will be performed.  7.  Thoroughly rinse your body with warm water from the neck down.  8.  DO NOT shower/wash with your normal soap after using and rinsing off  the CHG Soap.                9.  Pat yourself dry with a clean towel.            10.  Wear clean pajamas.            11.  Place clean sheets on your bed the night of your first shower and do not  sleep with pets. Day of Surgery : Do not apply any lotions/deodorants the morning of surgery.  Please wear clean clothes to the hospital/surgery center.  FAILURE TO FOLLOW THESE INSTRUCTIONS MAY RESULT IN THE CANCELLATION OF YOUR SURGERY PATIENT SIGNATURE_________________________________  NURSE SIGNATURE__________________________________  ________________________________________________________________________    CLEAR LIQUID DIET   Foods Allowed                                                                      Foods Excluded  Coffee and tea, regular and decaf                             liquids that you cannot  Plain Jell-O in any flavor                                             see through such as: Fruit ices (not with fruit pulp)                                     milk, soups, orange juice  Iced Popsicles                                    All solid food Carbonated beverages, regular and diet                                    Cranberry, grape and apple juices Sports drinks like Gatorade Lightly seasoned clear broth or consume(fat free) Sugar, honey syrup  Sample Menu  Breakfast                                Lunch                                     Supper Cranberry juice                    Beef broth                            Chicken broth Jell-O                                     Grape juice                           Apple juice Coffee or tea                        Jell-O                                      Popsicle                                                Coffee or tea                        Coffee or tea  _____________________________________________________________________    Incentive Spirometer  An incentive spirometer is a tool that can help keep your lungs clear and active. This tool measures how well you are filling your lungs with each breath. Taking long deep breaths may help reverse or decrease the chance of developing breathing (pulmonary) problems (especially infection) following:  A long period of time when you are unable to move or be active. BEFORE THE PROCEDURE   If the spirometer includes an indicator to show your best effort, your nurse or respiratory therapist will set it to a desired goal.  If possible, sit up straight or lean slightly forward. Try not to slouch.  Hold the incentive spirometer in an upright position. INSTRUCTIONS FOR USE   Sit on the edge of your bed if possible, or sit up as far as you can in bed or on a chair.  Hold the  incentive spirometer in an upright position.  Breathe out normally.  Place the mouthpiece in your mouth and seal your lips tightly around it.  Breathe in slowly and as deeply as possible, raising the piston or the ball toward the top of the column.  Hold your breath for 3-5 seconds or for as long as possible. Allow the piston or ball to fall to the bottom of the column.  Remove the mouthpiece from your mouth and breathe out normally.  Rest for a few seconds and repeat Steps 1 through 7 at least 10 times every 1-2 hours when you are awake. Take your time and take a few normal breaths between deep breaths.  The spirometer may include an indicator to show your best effort. Use the indicator as a goal to work toward during each repetition.  After each set of 10 deep breaths, practice coughing to be sure your lungs are clear. If you have an incision (the cut made at the time of surgery), support your incision when coughing by placing a pillow or rolled up towels firmly against it. Once you are able to get out of bed, walk around indoors and cough well. You may stop using the incentive spirometer when instructed by your caregiver.  RISKS AND COMPLICATIONS  Take your time so you do not get dizzy or light-headed.  If you are in pain, you may need to take or ask for pain medication before doing incentive spirometry. It is harder to take a deep breath if you are having pain. AFTER USE  Rest and breathe slowly and easily.  It can be helpful to keep track of a log of your progress. Your caregiver can provide you with a simple table to help with this. If you are using the spirometer at home, follow these instructions: [place] IF:   You are having difficultly using the spirometer.  You have trouble using the spirometer as often as instructed.  Your pain medication is not giving enough relief while using the spirometer.  You develop fever of 100.5 F (38.1 C) or higher. SEEK  IMMEDIATE MEDICAL CARE IF:   You cough up bloody sputum that had not been present before.  You develop fever of 102 F (38.9 C) or greater.  You develop worsening pain at or near the incision site. MAKE SURE YOU:   Understand these instructions.  Will watch your condition.  Will get help right away if you are not doing well or get worse. Document Released: 10/06/2006 Document Revised: 08/18/2011 Document Reviewed: 12/07/2006 Exit[employer] Patient Information 2014 [employer], [place].   ________________________________________________________________________   WHAT IS A BLOOD TRANSFUSION? Blood Transfusion Information  A transfusion is the replacement of blood or some of its parts. Blood is made up of multiple cells which provide different functions.  Red blood cells carry oxygen and are used for blood loss replacement.  White blood cells fight against infection.  Platelets control bleeding.  Plasma helps clot blood.  Other blood products are available for specialized needs, such as hemophilia or other clotting disorders. BEFORE THE TRANSFUSION  Who gives blood for transfusions?   Healthy volunteers who are fully evaluated to make sure their blood is safe. This is blood bank blood. Transfusion therapy is the safest it has ever been in the practice of medicine. Before blood is taken from a donor, a complete history is taken to make sure that person has no history of diseases nor engages in risky social behavior (examples are intravenous drug use or sexual activity with multiple partners). The donor's travel history is screened to minimize risk of transmitting infections, such as malaria. The donated blood is tested for signs of infectious diseases, such as HIV and hepatitis. The blood is then tested to be sure it is compatible with you in order to minimize the chance of a transfusion reaction. If you or a relative donates blood, this is often done in anticipation of surgery and is not  appropriate for emergency situations. It takes many days to process the donated blood. RISKS AND COMPLICATIONS Although transfusion therapy is very safe and saves many lives, the main dangers of transfusion include:   Getting an infectious disease.  Developing  a transfusion reaction. This is an allergic reaction to something in the blood you were given. Every precaution is taken to prevent this. The decision to have a blood transfusion has been considered carefully by your caregiver before blood is given. Blood is not given unless the benefits outweigh the risks. AFTER THE TRANSFUSION  Right after receiving a blood transfusion, you will usually feel much better and more energetic. This is especially true if your red blood cells have gotten low (anemic). The transfusion raises the level of the red blood cells which carry oxygen, and this usually causes an energy increase.  The nurse administering the transfusion will monitor you carefully for complications. HOME CARE INSTRUCTIONS  No special instructions are needed after a transfusion. You may find your energy is better. Speak with your caregiver about any limitations on activity for underlying diseases you may have. SEEK MEDICAL CARE IF:   Your condition is not improving after your transfusion.  You develop redness or irritation at the intravenous (IV) site. SEEK IMMEDIATE MEDICAL CARE IF:  Any of the following symptoms occur over the next 12 hours:  Shaking chills.  You have a temperature by mouth above 102 F (38.9 C), not controlled by medicine.  Chest, back, or muscle pain.  People around you feel you are not acting correctly or are confused.  Shortness of breath or difficulty breathing.  Dizziness and fainting.  You get a rash or develop hives.  You have a decrease in urine output.  Your urine turns a dark color or changes to pink, red, or brown. Any of the following symptoms occur over the next 10 days:  You have a  temperature by mouth above 102 F (38.9 C), not controlled by medicine.  Shortness of breath.  Weakness after normal activity.  The white part of the eye turns yellow (jaundice).  You have a decrease in the amount of urine or are urinating less often.  Your urine turns a dark color or changes to pink, red, or brown. Document Released: 05/23/2000 Document Revised: 08/18/2011 Document Reviewed: 01/10/2008 [facility] Patient Information 2014 [place], [place].  _______________________________________________________________________

## 2015-01-11 ENCOUNTER — Encounter (HOSPITAL_COMMUNITY): Payer: Self-pay

## 2015-01-11 ENCOUNTER — Other Ambulatory Visit: Payer: Self-pay

## 2015-01-11 ENCOUNTER — Ambulatory Visit (HOSPITAL_COMMUNITY)
Admission: RE | Admit: 2015-01-11 | Discharge: 2015-01-11 | Disposition: A | Discharge status: Home / Self Care | Payer: BLUE CROSS/BLUE SHIELD | Source: Ambulatory Visit | Attending: Gynecologic Oncology | Admitting: Gynecologic Oncology

## 2015-01-11 ENCOUNTER — Encounter (HOSPITAL_COMMUNITY)
Admission: RE | Admit: 2015-01-11 | Discharge: 2015-01-11 | Disposition: A | Discharge status: Home / Self Care | Payer: BLUE CROSS/BLUE SHIELD | Source: Ambulatory Visit | Attending: Obstetrics & Gynecology | Admitting: Obstetrics & Gynecology

## 2015-01-11 ENCOUNTER — Telehealth: Payer: Self-pay | Admitting: *Deleted

## 2015-01-11 DIAGNOSIS — Z01812 Encounter for preprocedural laboratory examination: Secondary | ICD-10-CM | POA: Insufficient documentation

## 2015-01-11 DIAGNOSIS — Z01810 Encounter for preprocedural cardiovascular examination: Secondary | ICD-10-CM | POA: Diagnosis not present

## 2015-01-11 DIAGNOSIS — Z0183 Encounter for blood typing: Secondary | ICD-10-CM | POA: Insufficient documentation

## 2015-01-11 DIAGNOSIS — N838 Other noninflammatory disorders of ovary, fallopian tube and broad ligament: Secondary | ICD-10-CM

## 2015-01-11 DIAGNOSIS — Z01818 Encounter for other preprocedural examination: Secondary | ICD-10-CM | POA: Insufficient documentation

## 2015-01-11 DIAGNOSIS — N839 Noninflammatory disorder of ovary, fallopian tube and broad ligament, unspecified: Secondary | ICD-10-CM | POA: Insufficient documentation

## 2015-01-11 HISTORY — DX: Anxiety disorder, unspecified: F41.9

## 2015-01-11 HISTORY — DX: Headache: R51

## 2015-01-11 HISTORY — DX: Anemia, unspecified: D64.9

## 2015-01-11 HISTORY — DX: Headache, unspecified: R51.9

## 2015-01-11 HISTORY — DX: Diaphragmatic hernia without obstruction or gangrene: K44.9

## 2015-01-11 HISTORY — DX: Gastro-esophageal reflux disease without esophagitis: K21.9

## 2015-01-11 HISTORY — DX: Malignant (primary) neoplasm, unspecified: C80.1

## 2015-01-11 LAB — URINALYSIS, ROUTINE W REFLEX MICROSCOPIC
BILIRUBIN URINE: NEGATIVE
GLUCOSE, UA: NEGATIVE mg/dL
Hgb urine dipstick: NEGATIVE
Ketones, ur: NEGATIVE mg/dL
LEUKOCYTES UA: NEGATIVE
Nitrite: NEGATIVE
PH: 6.5 (ref 5.0–8.0)
Protein, ur: NEGATIVE mg/dL
Specific Gravity, Urine: 1.015 (ref 1.005–1.030)
UROBILINOGEN UA: 0.2 mg/dL (ref 0.0–1.0)

## 2015-01-11 LAB — COMPREHENSIVE METABOLIC PANEL
ALT: 17 U/L (ref 14–54)
AST: 23 U/L (ref 15–41)
Albumin: 3.9 g/dL (ref 3.5–5.0)
Alkaline Phosphatase: 80 U/L (ref 38–126)
Anion gap: 7 (ref 5–15)
BILIRUBIN TOTAL: 0.7 mg/dL (ref 0.3–1.2)
BUN: 12 mg/dL (ref 6–20)
CALCIUM: 9 mg/dL (ref 8.9–10.3)
CHLORIDE: 103 mmol/L (ref 101–111)
CO2: 29 mmol/L (ref 22–32)
CREATININE: 1.02 mg/dL — AB (ref 0.44–1.00)
GFR calc Af Amer: >60 mL/min (ref >60)
GFR calc non Af Amer: >60 mL/min (ref >60)
Glucose, Bld: 97 mg/dL (ref 65–99)
Potassium: 4.6 mmol/L (ref 3.5–5.1)
Sodium: 139 mmol/L (ref 135–145)
TOTAL PROTEIN: 7.4 g/dL (ref 6.5–8.1)

## 2015-01-11 LAB — CBC WITH DIFFERENTIAL/PLATELET
BASOS ABS: 0.1 10*3/uL (ref 0.0–0.1)
BASOS PCT: 1 % (ref 0–1)
Eosinophils Absolute: 0.5 10*3/uL (ref 0.0–0.7)
Eosinophils Relative: 4 % (ref 0–5)
HEMATOCRIT: 38.8 % (ref 36.0–46.0)
Hemoglobin: 12.4 g/dL (ref 12.0–15.0)
Lymphocytes Relative: 21 % (ref 12–46)
Lymphs Abs: 2.4 10*3/uL (ref 0.7–4.0)
MCH: 28.8 pg (ref 26.0–34.0)
MCHC: 32 g/dL (ref 30.0–36.0)
MCV: 90 fL (ref 78.0–100.0)
Monocytes Absolute: 0.8 10*3/uL (ref 0.1–1.0)
Monocytes Relative: 7 % (ref 3–12)
NEUTROS ABS: 7.6 10*3/uL (ref 1.7–7.7)
Neutrophils Relative %: 67 % (ref 43–77)
Platelets: 382 10*3/uL (ref 150–400)
RBC: 4.31 MIL/uL (ref 3.87–5.11)
RDW: 13.2 % (ref 11.5–15.5)
WBC: 11.3 10*3/uL — AB (ref 4.0–10.5)

## 2015-01-11 NOTE — Telephone Encounter (Signed)
Patient called stating the oxycodone is working well just making her sleepy.  No nausea or emesis reported.  "If I stay on top of it, I am good."  Advised to call for any questions or concerns.

## 2015-01-11 NOTE — Telephone Encounter (Signed)
Called patient to check on her status and to see if her pain is being managed on home medications. Left VM requesting return call.

## 2015-01-16 ENCOUNTER — Encounter (HOSPITAL_COMMUNITY): Payer: Self-pay | Admitting: *Deleted

## 2015-01-16 ENCOUNTER — Inpatient Hospital Stay (HOSPITAL_COMMUNITY): Payer: BLUE CROSS/BLUE SHIELD | Admitting: Anesthesiology

## 2015-01-16 ENCOUNTER — Inpatient Hospital Stay (HOSPITAL_COMMUNITY): Payer: BLUE CROSS/BLUE SHIELD

## 2015-01-16 ENCOUNTER — Inpatient Hospital Stay (HOSPITAL_COMMUNITY)
Admission: RE | Admit: 2015-01-16 | Discharge: 2015-01-19 | DRG: 737 | Disposition: A | Discharge status: Home / Self Care | Payer: BLUE CROSS/BLUE SHIELD | Source: Ambulatory Visit | Attending: Gynecologic Oncology | Admitting: Gynecologic Oncology

## 2015-01-16 ENCOUNTER — Encounter (HOSPITAL_COMMUNITY)
Admission: RE | Disposition: A | Discharge status: Home / Self Care | Payer: Self-pay | Source: Ambulatory Visit | Attending: Gynecologic Oncology

## 2015-01-16 DIAGNOSIS — Z419 Encounter for procedure for purposes other than remedying health state, unspecified: Secondary | ICD-10-CM

## 2015-01-16 DIAGNOSIS — N839 Noninflammatory disorder of ovary, fallopian tube and broad ligament, unspecified: Secondary | ICD-10-CM | POA: Diagnosis present

## 2015-01-16 DIAGNOSIS — Z885 Allergy status to narcotic agent status: Secondary | ICD-10-CM

## 2015-01-16 DIAGNOSIS — N131 Hydronephrosis with ureteral stricture, not elsewhere classified: Secondary | ICD-10-CM | POA: Diagnosis present

## 2015-01-16 DIAGNOSIS — Z90710 Acquired absence of both cervix and uterus: Secondary | ICD-10-CM | POA: Diagnosis not present

## 2015-01-16 DIAGNOSIS — F419 Anxiety disorder, unspecified: Secondary | ICD-10-CM | POA: Diagnosis present

## 2015-01-16 DIAGNOSIS — C7800 Secondary malignant neoplasm of unspecified lung: Secondary | ICD-10-CM | POA: Diagnosis present

## 2015-01-16 DIAGNOSIS — C787 Secondary malignant neoplasm of liver and intrahepatic bile duct: Secondary | ICD-10-CM | POA: Diagnosis present

## 2015-01-16 DIAGNOSIS — K589 Irritable bowel syndrome without diarrhea: Secondary | ICD-10-CM | POA: Diagnosis present

## 2015-01-16 DIAGNOSIS — Z820 Family history of epilepsy and other diseases of the nervous system: Secondary | ICD-10-CM

## 2015-01-16 DIAGNOSIS — R1900 Intra-abdominal and pelvic swelling, mass and lump, unspecified site: Secondary | ICD-10-CM | POA: Diagnosis present

## 2015-01-16 DIAGNOSIS — Z791 Long term (current) use of non-steroidal anti-inflammatories (NSAID): Secondary | ICD-10-CM

## 2015-01-16 DIAGNOSIS — D259 Leiomyoma of uterus, unspecified: Secondary | ICD-10-CM | POA: Diagnosis present

## 2015-01-16 DIAGNOSIS — K5900 Constipation, unspecified: Secondary | ICD-10-CM | POA: Diagnosis present

## 2015-01-16 DIAGNOSIS — C569 Malignant neoplasm of unspecified ovary: Secondary | ICD-10-CM | POA: Diagnosis not present

## 2015-01-16 DIAGNOSIS — Z800 Family history of malignant neoplasm of digestive organs: Secondary | ICD-10-CM

## 2015-01-16 DIAGNOSIS — N838 Other noninflammatory disorders of ovary, fallopian tube and broad ligament: Secondary | ICD-10-CM

## 2015-01-16 DIAGNOSIS — C562 Malignant neoplasm of left ovary: Secondary | ICD-10-CM | POA: Diagnosis present

## 2015-01-16 DIAGNOSIS — R188 Other ascites: Secondary | ICD-10-CM | POA: Diagnosis present

## 2015-01-16 DIAGNOSIS — Z803 Family history of malignant neoplasm of breast: Secondary | ICD-10-CM | POA: Diagnosis not present

## 2015-01-16 DIAGNOSIS — Z87891 Personal history of nicotine dependence: Secondary | ICD-10-CM

## 2015-01-16 DIAGNOSIS — N920 Excessive and frequent menstruation with regular cycle: Secondary | ICD-10-CM | POA: Diagnosis present

## 2015-01-16 DIAGNOSIS — Z79899 Other long term (current) drug therapy: Secondary | ICD-10-CM

## 2015-01-16 DIAGNOSIS — K5790 Diverticulosis of intestine, part unspecified, without perforation or abscess without bleeding: Secondary | ICD-10-CM | POA: Diagnosis present

## 2015-01-16 DIAGNOSIS — K219 Gastro-esophageal reflux disease without esophagitis: Secondary | ICD-10-CM | POA: Diagnosis present

## 2015-01-16 HISTORY — PX: LAPAROTOMY: SHX154

## 2015-01-16 HISTORY — PX: CYSTOSCOPY W/ URETERAL STENT PLACEMENT: SHX1429

## 2015-01-16 HISTORY — PX: OOPHORECTOMY: SHX6387

## 2015-01-16 LAB — TYPE AND SCREEN
ABO/RH(D): O NEG
Antibody Screen: POSITIVE
DAT, IGG: NEGATIVE

## 2015-01-16 SURGERY — LAPAROTOMY, EXPLORATORY
Anesthesia: General | Site: Abdomen

## 2015-01-16 MED ORDER — NEOSTIGMINE METHYLSULFATE 10 MG/10ML IV SOLN
INTRAVENOUS | Status: AC
  Filled 2015-01-16: qty 1

## 2015-01-16 MED ORDER — LACTATED RINGERS IV SOLN
INTRAVENOUS | Status: DC
  Administered 2015-01-16 (×2): via INTRAVENOUS
  Administered 2015-01-16: 1000 mL via INTRAVENOUS

## 2015-01-16 MED ORDER — OXYCODONE HCL 5 MG PO TABS: 5.0000 mg | ORAL_TABLET | Freq: Once | ORAL | Status: DC | PRN

## 2015-01-16 MED ORDER — ONDANSETRON HCL 4 MG/2ML IJ SOLN
INTRAMUSCULAR | Status: AC
  Filled 2015-01-16: qty 2

## 2015-01-16 MED ORDER — GLYCOPYRROLATE 0.2 MG/ML IJ SOLN
INTRAMUSCULAR | Status: DC | PRN
  Administered 2015-01-16: 0.6 mg via INTRAVENOUS

## 2015-01-16 MED ORDER — PROMETHAZINE HCL 25 MG/ML IJ SOLN
6.2500 mg | INTRAMUSCULAR | Status: AC | PRN
  Administered 2015-01-16 (×2): 6.25 mg via INTRAVENOUS

## 2015-01-16 MED ORDER — ONDANSETRON HCL 4 MG/2ML IJ SOLN
4.0000 mg | Freq: Four times a day (QID) | INTRAMUSCULAR | Status: DC | PRN
Start: 2015-01-16 — End: 2015-01-19

## 2015-01-16 MED ORDER — ONDANSETRON HCL 4 MG/2ML IJ SOLN
INTRAMUSCULAR | Status: DC | PRN
  Administered 2015-01-16: 4 mg via INTRAVENOUS

## 2015-01-16 MED ORDER — KETOROLAC TROMETHAMINE 30 MG/ML IJ SOLN
15.0000 mg | Freq: Four times a day (QID) | INTRAMUSCULAR | Status: AC
Start: 2015-01-16 — End: 2015-01-17
  Administered 2015-01-16 – 2015-01-17 (×4): 15 mg via INTRAVENOUS
  Filled 2015-01-16 (×4): qty 1

## 2015-01-16 MED ORDER — CEFAZOLIN SODIUM-DEXTROSE 2-3 GM-% IV SOLR
INTRAVENOUS | Status: AC
  Filled 2015-01-16: qty 50

## 2015-01-16 MED ORDER — FENTANYL CITRATE (PF) 250 MCG/5ML IJ SOLN
INTRAMUSCULAR | Status: AC
  Filled 2015-01-16: qty 25

## 2015-01-16 MED ORDER — GLYCOPYRROLATE 0.2 MG/ML IJ SOLN
INTRAMUSCULAR | Status: AC
  Filled 2015-01-16: qty 3

## 2015-01-16 MED ORDER — HYDROMORPHONE HCL 1 MG/ML IJ SOLN
0.5000 mg | INTRAMUSCULAR | Status: AC | PRN
  Administered 2015-01-16 (×2): 0.5 mg via INTRAVENOUS
  Filled 2015-01-16 (×2): qty 1

## 2015-01-16 MED ORDER — PROMETHAZINE HCL 25 MG/ML IJ SOLN
INTRAMUSCULAR | Status: AC
  Filled 2015-01-16: qty 1

## 2015-01-16 MED ORDER — LIDOCAINE HCL (CARDIAC) 20 MG/ML IV SOLN
INTRAVENOUS | Status: AC
  Filled 2015-01-16: qty 5

## 2015-01-16 MED ORDER — ALPRAZOLAM 0.5 MG PO TABS
0.5000 mg | ORAL_TABLET | Freq: Three times a day (TID) | ORAL | Status: DC | PRN
  Administered 2015-01-16 – 2015-01-17 (×2): 0.5 mg via ORAL
  Filled 2015-01-16 (×2): qty 1

## 2015-01-16 MED ORDER — HEPARIN SODIUM (PORCINE) 1000 UNIT/ML IJ SOLN
INTRAMUSCULAR | Status: AC
  Filled 2015-01-16: qty 1

## 2015-01-16 MED ORDER — KETOROLAC TROMETHAMINE 30 MG/ML IJ SOLN
INTRAMUSCULAR | Status: AC
  Filled 2015-01-16: qty 1

## 2015-01-16 MED ORDER — LABETALOL HCL 5 MG/ML IV SOLN
INTRAVENOUS | Status: DC | PRN
  Administered 2015-01-16: 5 mg via INTRAVENOUS
  Administered 2015-01-16 (×2): 2.5 mg via INTRAVENOUS

## 2015-01-16 MED ORDER — ROCURONIUM BROMIDE 100 MG/10ML IV SOLN
INTRAVENOUS | Status: AC
  Filled 2015-01-16: qty 1

## 2015-01-16 MED ORDER — HYDROMORPHONE HCL 2 MG/ML IJ SOLN
INTRAMUSCULAR | Status: AC
  Filled 2015-01-16: qty 1

## 2015-01-16 MED ORDER — SODIUM CHLORIDE 0.9 % IJ SOLN
INTRAMUSCULAR | Status: AC
  Filled 2015-01-16: qty 20

## 2015-01-16 MED ORDER — KETOROLAC TROMETHAMINE 30 MG/ML IJ SOLN: 30.0000 mg | Freq: Once | INTRAMUSCULAR | Status: DC

## 2015-01-16 MED ORDER — PROPOFOL 10 MG/ML IV BOLUS
INTRAVENOUS | Status: DC | PRN
  Administered 2015-01-16: 150 mg via INTRAVENOUS

## 2015-01-16 MED ORDER — ROCURONIUM BROMIDE 100 MG/10ML IV SOLN
INTRAVENOUS | Status: DC | PRN
  Administered 2015-01-16 (×2): 10 mg via INTRAVENOUS
  Administered 2015-01-16: 40 mg via INTRAVENOUS
  Administered 2015-01-16 (×3): 10 mg via INTRAVENOUS

## 2015-01-16 MED ORDER — ARTIFICIAL TEARS OP OINT
TOPICAL_OINTMENT | OPHTHALMIC | Status: AC
  Filled 2015-01-16: qty 3.5

## 2015-01-16 MED ORDER — DEXAMETHASONE SODIUM PHOSPHATE 10 MG/ML IJ SOLN
INTRAMUSCULAR | Status: AC
  Filled 2015-01-16: qty 1

## 2015-01-16 MED ORDER — ENSURE ENLIVE PO LIQD
237.0000 mL | Freq: Two times a day (BID) | ORAL | Status: DC
  Administered 2015-01-16 – 2015-01-18 (×5): 237 mL via ORAL

## 2015-01-16 MED ORDER — ENOXAPARIN SODIUM 40 MG/0.4ML ~~LOC~~ SOLN
40.0000 mg | SUBCUTANEOUS | Status: DC
  Administered 2015-01-17 – 2015-01-19 (×3): 40 mg via SUBCUTANEOUS
  Filled 2015-01-16 (×4): qty 0.4

## 2015-01-16 MED ORDER — ACETAMINOPHEN 500 MG PO TABS
1000.0000 mg | ORAL_TABLET | Freq: Four times a day (QID) | ORAL | Status: DC
  Administered 2015-01-16 – 2015-01-19 (×9): 1000 mg via ORAL
  Filled 2015-01-16 (×15): qty 2

## 2015-01-16 MED ORDER — DEXAMETHASONE SODIUM PHOSPHATE 10 MG/ML IJ SOLN
INTRAMUSCULAR | Status: DC | PRN
  Administered 2015-01-16: 10 mg via INTRAVENOUS

## 2015-01-16 MED ORDER — ONDANSETRON HCL 4 MG PO TABS
4.0000 mg | ORAL_TABLET | Freq: Four times a day (QID) | ORAL | Status: DC | PRN
Start: 2015-01-16 — End: 2015-01-19

## 2015-01-16 MED ORDER — SODIUM CHLORIDE 0.9 % IJ SOLN
INTRAMUSCULAR | Status: AC
  Filled 2015-01-16: qty 10

## 2015-01-16 MED ORDER — HYDROMORPHONE HCL 1 MG/ML IJ SOLN
0.2500 mg | INTRAMUSCULAR | Status: DC | PRN
  Administered 2015-01-16 (×2): 0.5 mg via INTRAVENOUS

## 2015-01-16 MED ORDER — LIDOCAINE HCL (CARDIAC) 20 MG/ML IV SOLN
INTRAVENOUS | Status: DC | PRN
  Administered 2015-01-16: 100 mg via INTRAVENOUS

## 2015-01-16 MED ORDER — KCL IN DEXTROSE-NACL 20-5-0.45 MEQ/L-%-% IV SOLN
INTRAVENOUS | Status: DC
Start: 2015-01-16 — End: 2015-01-17
  Administered 2015-01-16: 20:00:00 via INTRAVENOUS
  Filled 2015-01-16 (×2): qty 1000

## 2015-01-16 MED ORDER — SUCCINYLCHOLINE CHLORIDE 20 MG/ML IJ SOLN
INTRAMUSCULAR | Status: DC | PRN
  Administered 2015-01-16: 100 mg via INTRAVENOUS

## 2015-01-16 MED ORDER — IOHEXOL 300 MG/ML  SOLN
INTRAMUSCULAR | Status: DC | PRN
  Administered 2015-01-16: 14 mL

## 2015-01-16 MED ORDER — FENTANYL CITRATE (PF) 100 MCG/2ML IJ SOLN
INTRAMUSCULAR | Status: DC | PRN
  Administered 2015-01-16: 100 ug via INTRAVENOUS
  Administered 2015-01-16 (×3): 50 ug via INTRAVENOUS

## 2015-01-16 MED ORDER — HYDROMORPHONE HCL 1 MG/ML IJ SOLN
INTRAMUSCULAR | Status: DC | PRN
  Administered 2015-01-16 (×2): 1 mg via INTRAVENOUS
  Administered 2015-01-16 (×2): 0.5 mg via INTRAVENOUS
  Administered 2015-01-16: 1 mg via INTRAVENOUS

## 2015-01-16 MED ORDER — HYDROMORPHONE HCL 1 MG/ML IJ SOLN
INTRAMUSCULAR | Status: AC
  Filled 2015-01-16: qty 1

## 2015-01-16 MED ORDER — CEFAZOLIN SODIUM-DEXTROSE 2-3 GM-% IV SOLR
2.0000 g | INTRAVENOUS | Status: AC
  Administered 2015-01-16: 2 g via INTRAVENOUS

## 2015-01-16 MED ORDER — OXYCODONE HCL 5 MG/5ML PO SOLN
5.0000 mg | Freq: Once | ORAL | Status: DC | PRN
  Filled 2015-01-16: qty 5

## 2015-01-16 MED ORDER — IBUPROFEN 800 MG PO TABS
800.0000 mg | ORAL_TABLET | Freq: Four times a day (QID) | ORAL | Status: DC
  Administered 2015-01-17 – 2015-01-18 (×5): 800 mg via ORAL
  Filled 2015-01-16 (×12): qty 1

## 2015-01-16 MED ORDER — SODIUM CHLORIDE 0.9 % IR SOLN
Status: DC | PRN
  Administered 2015-01-16: 3000 mL via INTRAVESICAL

## 2015-01-16 MED ORDER — MIDAZOLAM HCL 2 MG/2ML IJ SOLN
INTRAMUSCULAR | Status: AC
  Filled 2015-01-16: qty 4

## 2015-01-16 MED ORDER — MIDAZOLAM HCL 5 MG/5ML IJ SOLN
INTRAMUSCULAR | Status: DC | PRN
  Administered 2015-01-16: 2 mg via INTRAVENOUS

## 2015-01-16 MED ORDER — BUPIVACAINE LIPOSOME 1.3 % IJ SUSP
20.0000 mL | Freq: Once | INTRAMUSCULAR | Status: AC
  Administered 2015-01-16: 20 mL
  Filled 2015-01-16: qty 20

## 2015-01-16 MED ORDER — PROPOFOL 10 MG/ML IV BOLUS
INTRAVENOUS | Status: AC
  Filled 2015-01-16: qty 20

## 2015-01-16 MED ORDER — OXYCODONE HCL 5 MG PO TABS
10.0000 mg | ORAL_TABLET | ORAL | Status: DC | PRN
  Administered 2015-01-16 – 2015-01-19 (×11): 10 mg via ORAL
  Filled 2015-01-16 (×12): qty 2

## 2015-01-16 MED ORDER — LABETALOL HCL 5 MG/ML IV SOLN
INTRAVENOUS | Status: AC
  Filled 2015-01-16: qty 4

## 2015-01-16 MED ORDER — NEOSTIGMINE METHYLSULFATE 10 MG/10ML IV SOLN
INTRAVENOUS | Status: DC | PRN
  Administered 2015-01-16: 5 mg via INTRAVENOUS

## 2015-01-16 MED ORDER — KETOROLAC TROMETHAMINE 30 MG/ML IJ SOLN
30.0000 mg | Freq: Four times a day (QID) | INTRAMUSCULAR | Status: AC
  Filled 2015-01-16: qty 1

## 2015-01-16 MED ORDER — ENOXAPARIN SODIUM 40 MG/0.4ML ~~LOC~~ SOLN
40.0000 mg | SUBCUTANEOUS | Status: AC
  Administered 2015-01-16: 40 mg via SUBCUTANEOUS
  Filled 2015-01-16: qty 0.4

## 2015-01-16 SURGICAL SUPPLY — 56 items
ATTRACTOMAT 16X20 MAGNETIC DRP (DRAPES) ×5 IMPLANT
BAG URINE DRAINAGE (UROLOGICAL SUPPLIES) ×5 IMPLANT
BLADE EXTENDED COATED 6.5IN (ELECTRODE) ×5 IMPLANT
CATH FOLEY 2WAY SLVR  5CC 16FR (CATHETERS) ×2
CATH FOLEY 2WAY SLVR 5CC 16FR (CATHETERS) ×3 IMPLANT
CATH INTERMIT  6FR 70CM (CATHETERS) ×5 IMPLANT
CLIP TI MEDIUM LARGE 6 (CLIP) ×5 IMPLANT
CONT SPEC 4OZ CLIKSEAL STRL BL (MISCELLANEOUS) ×5 IMPLANT
COVER SURGICAL LIGHT HANDLE (MISCELLANEOUS) ×10 IMPLANT
DRAPE C-ARM 42X120 X-RAY (DRAPES) ×5 IMPLANT
DRAPE TABLE BACK 44X90 PK DISP (DRAPES) ×10 IMPLANT
DRAPE UTILITY XL STRL (DRAPES) ×5 IMPLANT
DRAPE WARM FLUID 44X44 (DRAPE) ×5 IMPLANT
DRSG TELFA 4X10 ISLAND STR (GAUZE/BANDAGES/DRESSINGS) ×5 IMPLANT
ELECT PENCIL ROCKER SW 15FT (MISCELLANEOUS) ×5 IMPLANT
ELECT REM PT RETURN 9FT ADLT (ELECTROSURGICAL) ×5
ELECTRODE REM PT RTRN 9FT ADLT (ELECTROSURGICAL) ×3 IMPLANT
GAUZE SPONGE 4X4 12PLY STRL (GAUZE/BANDAGES/DRESSINGS) ×5 IMPLANT
GAUZE SPONGE 4X4 16PLY XRAY LF (GAUZE/BANDAGES/DRESSINGS) ×5 IMPLANT
GLOVE BIO SURGEON STRL SZ 6.5 (GLOVE) ×4 IMPLANT
GLOVE BIO SURGEON STRL SZ7.5 (GLOVE) ×10 IMPLANT
GLOVE BIO SURGEONS STRL SZ 6.5 (GLOVE) ×1
GLOVE BIOGEL PI IND STRL 7.0 (GLOVE) IMPLANT
GLOVE BIOGEL PI INDICATOR 7.0 (GLOVE)
GOWN STRL REUS W/ TWL LRG LVL3 (GOWN DISPOSABLE) ×9 IMPLANT
GOWN STRL REUS W/TWL LRG LVL3 (GOWN DISPOSABLE) ×6
GUIDEWIRE STR DUAL SENSOR (WIRE) ×5 IMPLANT
HOLDER FOLEY CATH W/STRAP (MISCELLANEOUS) IMPLANT
KIT BASIN OR (CUSTOM PROCEDURE TRAY) ×5 IMPLANT
LOOP VESSEL MAXI BLUE (MISCELLANEOUS) ×5 IMPLANT
MANIFOLD NEPTUNE II (INSTRUMENTS) ×5 IMPLANT
NEEDLE HYPO 22GX1.5 SAFETY (NEEDLE) IMPLANT
NEEDLE HYPO 25X1 1.5 SAFETY (NEEDLE) ×5 IMPLANT
NS IRRIG 1000ML POUR BTL (IV SOLUTION) IMPLANT
PACK GENERAL/GYN (CUSTOM PROCEDURE TRAY) ×5 IMPLANT
SET IRRIG Y TYPE TUR BLADDER L (SET/KITS/TRAYS/PACK) ×5 IMPLANT
SHEET LAVH (DRAPES) ×5 IMPLANT
SPONGE LAP 18X18 X RAY DECT (DISPOSABLE) ×15 IMPLANT
SPONGE SURGIFOAM ABS GEL 100 (HEMOSTASIS) ×5 IMPLANT
STENT URET 6FRX24 CONTOUR (STENTS) ×5 IMPLANT
SUT PDS AB 1 CTX 36 (SUTURE) ×5 IMPLANT
SUT PDS AB 1 CTXB1 36 (SUTURE) ×10 IMPLANT
SUT SILK 3 0 SH CR/8 (SUTURE) ×5 IMPLANT
SUT VIC AB 0 CT1 36 (SUTURE) IMPLANT
SUT VIC AB 2-0 CT2 27 (SUTURE) IMPLANT
SUT VIC AB 2-0 SH 27 (SUTURE) ×2
SUT VIC AB 2-0 SH 27X BRD (SUTURE) ×3 IMPLANT
SUT VIC AB 4-0 PS2 27 (SUTURE) ×10 IMPLANT
SUT VICRYL 2 0 18  UND BR (SUTURE) ×2
SUT VICRYL 2 0 18 UND BR (SUTURE) ×3 IMPLANT
SYR 20CC LL (SYRINGE) ×5 IMPLANT
SYR CONTROL 10ML LL (SYRINGE) ×10 IMPLANT
TOWEL OR 17X26 10 PK STRL BLUE (TOWEL DISPOSABLE) ×10 IMPLANT
TOWEL OR NON WOVEN STRL DISP B (DISPOSABLE) ×5 IMPLANT
TRAY FOLEY W/METER SILVER 14FR (SET/KITS/TRAYS/PACK) ×5 IMPLANT
TRAY FOLEY W/METER SILVER 16FR (SET/KITS/TRAYS/PACK) IMPLANT

## 2015-01-16 NOTE — Interval H&P Note (Signed)
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 After consideration of risks, benefits and other options for treatment, the patient has consented to  Procedure(s): EXPLORATORY LAPAROTOMY  (N/A) BILATERAL OOPHORECTOMY RESECTION OF PELVIC MASS  (Bilateral) OMENTECTOMY DEBULKING POSSIBLE BOWEL RESECTION POSSIBLE URETERAL STENT (Bilateral) as a surgical intervention .  The patient's history has been reviewed, patient examined, no change in status, stable for surgery.  I have reviewed the patient's chart and labs.  Questions were answered to the patient's satisfaction.     [name]

## 2015-01-16 NOTE — Op Note (Signed)
Pre-operative diagnosis :  Complete left ureteral obstruction secondary to external compression of the mid ureter  Postoperative diagnosis: Same    Operation:  Cystourethroscopy, left retrograde pyelogram interpretation, left double-J stent (6 [place] by 24 cm.  Surgeon:  Chauncey Cruel. Gaynelle Arabian, MD  First assistant: None     Anesthesia:  General endotracheal  Preparation: After appropriate anesthesia, the patient brought to the operating room, placed on the operating table in the dorsal supine position where general Gen. endotracheal anesthesia was introduced. She was replaced in dorsal lithotomy position. She underwent abdominal expiration per Dr. Lahoma Crocker, and Dr. Dominica Severin. The patient was found to have completely obstructed left mid ureter, and urology was consulted. A timeout was observed. The patient was prepared for retrograde pyelography. There are band was double checked.     Review history:  The patient is a 54 year old female with a left abdominal mass, originally thought to have metastatic ovarian cancer, but frozen section of the mass shows probable metastatic colon cancer. She was found to have completely obstructed left mid ureter, and urology was consulted for left double-J stent.   Statement of  Likelihood of Success: Excellent. TIME-OUT observed.:  Procedure: Cystourethroscopy was accomplished using the Wolf 30 cystoscope. The left ureteral orifice was identified, and resume pyelography reveals normal appearing lower ureter. However, the mid ureter was completely obstructed secondary to external compression. Apple core type lesion was noted. Contrast would not go through the lesion. No upper ureter or kidney could be defined.  A 0.038 guidewire was passed through the open-ended catheter, and was able to be passed through the tight obstructed ureter, and into the renal pelvis and coiled. The ureteral catheter was then slowly advanced through this tight opening, into the renal pelvis.  The guidewire was removed, and retrograde pyelogram was accomplished, revealing markedly hydronephrotic upper ureter, and dilated renal pelvis and calyces with blunting.  With the guidewire replaced, and into the renal pelvis, the open-ended catheter was removed, and a 6 [place] by 24 cm double-J stent passed over the guidewire, and slowly advanced into the renal pelvis. It was coiled in the renal pelvis, and coiled in the bladder. Photodocumentation was accomplished an x-ray confirmation was a copper's. The bladder was drained of fluid, the size 16 French Foley catheter was placed with 10 mL in balloon.  The patient then underwent abdominal closure.

## 2015-01-16 NOTE — Anesthesia Preprocedure Evaluation (Addendum)
Anesthesia Evaluation  Patient identified by MRN, date of birth, ID band Patient awake    Reviewed: Allergy & Precautions, NPO status , Patient's Chart, lab work & pertinent test results  Airway Mallampati: II  TM Distance: >3 FB Neck ROM: Full    Dental  (+) Teeth Intact, Dental Advisory Given   Pulmonary former smoker,  breath sounds clear to auscultation        Cardiovascular negative cardio ROS  Rhythm:Regular Rate:Normal     Neuro/Psych Anxiety negative neurological ROS     GI/Hepatic Neg liver ROS, hiatal hernia, GERD-  ,  Endo/Other  negative endocrine ROS  Renal/GU negative Renal ROS     Musculoskeletal   Abdominal   Peds  Hematology negative hematology ROS (+)   Anesthesia Other Findings   Reproductive/Obstetrics                            Anesthesia Physical Anesthesia Plan  ASA: II  Anesthesia Plan: General   Post-op Pain Management:    Induction: Intravenous  Airway Management Planned: Oral ETT  Additional Equipment:   Intra-op Plan:   Post-operative Plan: Extubation in OR  Informed Consent: I have reviewed the patients History and Physical, chart, labs and discussed the procedure including the risks, benefits and alternatives for the proposed anesthesia with the patient or authorized representative who has indicated his/her understanding and acceptance.   Dental advisory given  Plan Discussed with: CRNA  Anesthesia Plan Comments:         Anesthesia Quick Evaluation

## 2015-01-16 NOTE — Transfer of Care (Signed)
Immediate Anesthesia Transfer of Care Note  Patient: Brianna Vasquez  Procedure(s) Performed: Procedure(s): EXPLORATORY LAPAROTOMY  (N/A) BILATERAL OOPHORECTOMY RESECTION OF PELVIC MASS  (Bilateral) CYSTOSCOPY WITH RETROGRADE PYELOGRAM/URETERAL STENT PLACEMENT (Left)  Patient Location: PACU  Anesthesia Type:General  Level of Consciousness:  sedated, patient cooperative and responds to stimulation  Airway & Oxygen Therapy:Patient Spontanous Breathing and Patient connected to face mask oxgen  Post-op Assessment:  Report given to PACU RN and Post -op Vital signs reviewed and stable  Post vital signs:  Reviewed and stable  Last Vitals:  Filed Vitals:   01/16/15 1112  BP: 179/88  Pulse: 97  Temp: 36.8 C  Resp: 16    Complications: No apparent anesthesia complications

## 2015-01-16 NOTE — H&P (View-Only) (Signed)
Consult Note: Gyn-Onc   Brianna Vasquez 54 y.o. female  Chief Complaint  Patient presents with  . 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 Left hydronephrosis secondary to mass.  Plan: I would recommend resection of the mass to relieve the hydronephrosis and to establish a histologic diagnosis. Preoperative chest x-ray tumor markers including CEA and CA-125.  I had a lengthy discussion the patient and her family members indicating that while resection of the mass think is important for palliation and diagnosis we've is still need to deal with the liver and lung metastases using chemotherapy. The patient is aware that surgery may require colonic resection and anastomosis. If the hydronephrosis is not relieved by removal mass then a ureteral stent would likely be necessary.  The risks of surgery including hemorrhage, infection, injury to adjacent viscera, and aesthetic risks, and thromboembolic complications were discussed. Their questions are answered. Surgery is scheduled for August 9. Dr. Ned Clines will be the primary surgeon. The patient is in agreement with this plan.      HPI: 54 year old white female seen in consultation at the request of Dr. Zadie Rhine regarding management of a newly diagnosed left pelvic mass, hydronephrosis, liver metastases, and lung metastases. The patient first noted the mass in the left lower quadrant. CT scan was obtained showing the above-noted findings as well as a enlarged periaortic lymph node. There is no evidence of ascites. Patient previously had a hysterectomy for menorrhagia and uterine fibroids. She has no other significant gynecologic history.  She has a family history of a paternal cousin with breast cancer who has a BRCA mutation. She has a paternal aunt with breast cancer and her father died of pancreatic cancer. The patient her self has not been tested for  BRCA.  The present time the patient has some pain left lower quadrant. She denies any flank pain. She has urinary frequency. She is noted that she has more constipation in recent months where previously she had more diarrhea consistent with irritable bowel syndrome.  The patient had colonoscopy in April and has had mammograms within the past year. Tumor markers are not available today.  Review of Systems:10 point review of systems is negative except as noted in interval history.   Vitals: Blood pressure 163/86, pulse 95, temperature 98.6 F (37 C), temperature source Oral, resp. rate 18, height $RemoveBe'5\' 5"'tuRlLkbNS$  (1.651 m), weight 179 lb 3.2 oz (81.285 kg), SpO2 100 %.  Physical Exam: General : The patient is a healthy woman in no acute distress.  HEENT: normocephalic, extraoccular movements normal; neck is supple without thyromegally  Lynphnodes: Supraclavicular and inguinal nodes not enlarged  Abdomen: In general the abdomen is soft although there is a mass in the left mid abdomen. There is no evidence of ascites or hernias. Pelvic:  EGBUS: Normal female  Vagina: Normal, no lesions  Urethra and Bladder: Normal, non-tender  Cervix: Surgically absent  Uterus: Surgically absent  Bi-manual examination: Nontender. There is no mass in the pelvis although the mass is easily palpable in the left mid abdomen.  Rectal: normal sphincter tone, no masses, no blood  Lower extremities: No edema or varicosities. Normal range of motion      Allergies  Allergen Reactions  . Oxycodone Other (See Comments)    Hallucinations     Past Medical History  Diagnosis Date  . Medical history non-contributory   . Wears glasses   .  Diverticulosis     dx 09/2014    Past Surgical History  Procedure Laterality Date  . Wisdom tooth extraction    . Cholecystectomy  2007    lap choli  . Vaginal hysterectomy  2005  . Breast lumpectomy Right 10/2013    Current Outpatient Prescriptions  Medication Sig Dispense  Refill  . b complex vitamins tablet Take 1 tablet by mouth daily.    . Ginger, Zingiber officinalis, (GINGER ROOT PO) Take 1 capsule by mouth daily.    Nyoka Cowden Tea, Camillia sinensis, (GREEN TEA EXTRACT PO) Take 1 capsule by mouth daily.    . Naproxen Sodium (ALEVE PO) Take by mouth as needed.     Marland Kitchen omeprazole (PRILOSEC) 20 MG capsule Take 20 mg by mouth daily.     No current facility-administered medications for this visit.    History   Social History  . Marital Status: Single    Spouse Name: N/A  . Number of Children: N/A  . Years of Education: N/A   Occupational History  . Not on file.   Social History Main Topics  . Smoking status: Former Smoker    Quit date: 10/07/1985  . Smokeless tobacco: Not on file  . Alcohol Use: Yes     Comment: occ  . Drug Use: No  . Sexual Activity: Not on file   Other Topics Concern  . Not on file   Social History Narrative    Family History  Problem Relation Age of Onset  . Cancer Father 36    pancreatic  . Cancer Paternal Aunt 91    breast   . Cancer Paternal Grandmother 12    breast/mastectomy  . Colon cancer Maternal Grandfather   . Alzheimer's disease Paternal Grandfather       Alvino Chapel, MD 01/05/2015, 9:07 AM       Consult Note: Gyn-Onc   Brianna Vasquez 54 y.o. female  Chief Complaint  Patient presents with  . left adnexal mass    New consult    Assessment :  Plan:  Interval History:   HPI:  Review of Systems:10 point review of systems is negative except as noted in interval history.   Vitals: Blood pressure 163/86, pulse 95, temperature 98.6 F (37 C), temperature source Oral, resp. rate 18, height $RemoveBe'5\' 5"'ODFWEJdVa$  (1.651 m), weight 179 lb 3.2 oz (81.285 kg), SpO2 100 %.  Physical Exam: General : The patient is a healthy woman in no acute distress.  HEENT: normocephalic, extraoccular movements normal; neck is supple without thyromegally  Lynphnodes: Supraclavicular and inguinal nodes not  enlarged  Abdomen: Soft, non-tender, no ascites, no organomegally, no masses, no hernias  Pelvic:  EGBUS: Normal female  Vagina: Normal, no lesions  Urethra and Bladder: Normal, non-tender  Cervix: Surgically absent  Uterus: Surgically absent  Bi-manual examination: Non-tender; no adenxal masses or nodularity  Rectal: normal sphincter tone, no masses, no blood  Lower extremities: No edema or varicosities. Normal range of motion      Allergies  Allergen Reactions  . Oxycodone Other (See Comments)    Hallucinations     Past Medical History  Diagnosis Date  . Medical history non-contributory   . Wears glasses   . Diverticulosis     dx 09/2014    Past Surgical History  Procedure Laterality Date  . Wisdom tooth extraction    . Cholecystectomy  2007    lap choli  . Vaginal hysterectomy  2005  . Breast lumpectomy Right 10/2013  Current Outpatient Prescriptions  Medication Sig Dispense Refill  . b complex vitamins tablet Take 1 tablet by mouth daily.    . Ginger, Zingiber officinalis, (GINGER ROOT PO) Take 1 capsule by mouth daily.    Nyoka Cowden Tea, Camillia sinensis, (GREEN TEA EXTRACT PO) Take 1 capsule by mouth daily.    . Naproxen Sodium (ALEVE PO) Take by mouth as needed.     Marland Kitchen omeprazole (PRILOSEC) 20 MG capsule Take 20 mg by mouth daily.     No current facility-administered medications for this visit.    History   Social History  . Marital Status: Single    Spouse Name: N/A  . Number of Children: N/A  . Years of Education: N/A   Occupational History  . Not on file.   Social History Main Topics  . Smoking status: Former Smoker    Quit date: 10/07/1985  . Smokeless tobacco: Not on file  . Alcohol Use: Yes     Comment: occ  . Drug Use: No  . Sexual Activity: Not on file   Other Topics Concern  . Not on file   Social History Narrative    Family History  Problem Relation Age of Onset  . Cancer Father 43    pancreatic  . Cancer Paternal Aunt 29     breast   . Cancer Paternal Grandmother 65    breast/mastectomy  . Colon cancer Maternal Grandfather   . Alzheimer's disease Paternal Grandfather       Alvino Chapel, MD 01/05/2015, 9:07 AM

## 2015-01-16 NOTE — Consult Note (Signed)
Urology Consult  Referring physician:  Nancy Marus, MD Reason for referral:  Right ureteral obstruction  Chief Complaint:  Abdominal pain, abdominal mass  History of Present Illness:  HPI: 54 year old white female seen in for intraoperative consultation  regarding management of a newly diagnosed left pelvic mass, hydronephrosis, liver metastases, with  Associated  lung metastases. In the OR, She was found to have a L ureter which was completely obstructed in the mid-ureter, from external compression, presumably from tumor.    The patient first noted the mass in the left lower quadrant. CT scan was obtained showing the above-noted findings as well as a enlarged periaortic lymph node. There is no evidence of ascites. Patient previously had a hysterectomy for menorrhagia and uterine fibroids. She has no other significant gynecologic history.  She has a family history of a paternal cousin with breast cancer who has a BRCA mutation. She has a paternal aunt with breast cancer and her father died of pancreatic cancer. The patient her self has not been tested for BRCA.  The present time the patient has some pain left lower quadrant. She denies any flank pain. She has urinary frequency. She is noted that she has more constipation in recent months where previously she had more diarrhea consistent with irritable bowel syndrome.  The patient had colonoscopy in April and has had mammograms within the past year. Tumor markers are not available today.   Past Medical History  Diagnosis Date  . Wears glasses   . Diverticulosis     dx 09/2014  . Esophagus hernia     hx of  . GERD (gastroesophageal reflux disease)     once in a while  . Anemia     hx of, none since hysterectomy  . Headache     severe, for last few mornings  . Cancer     "ovary tumor-on colon, kidney, spots on liver and lungs"  . Anxiety     recent, situational   Past Surgical History  Procedure Laterality Date  . Wisdom tooth  extraction  ~2001  . Cholecystectomy  2007    lap choli  . Breast lumpectomy Right 10/2013  . Vaginal hysterectomy  2005  . Colonoscopy  2016    Medications: I have reviewed the patient's current medications. Allergies:  Allergies  Allergen Reactions  . Darvon [Propoxyphene]     Hallucinations   . Other Swelling    Adhesive Glue-Caused rash and burning.     Family History  Problem Relation Age of Onset  . Cancer Father 53    pancreatic  . Cancer Paternal Aunt 70    breast   . Cancer Paternal Grandmother 59    breast/mastectomy  . Colon cancer Maternal Grandfather   . Alzheimer's disease Paternal Grandfather    Social History:  reports that she quit smoking about 29 years ago. Her smoking use included Cigarettes. She has never used smokeless tobacco. She reports that she drinks alcohol. She reports that she does not use illicit drugs.  ROS: All systems are reviewed and negative except as noted.   Physical Exam:  Vital signs in last 24 hours: Temp:  [98.2 F (36.8 C)] 98.2 F (36.8 C) (08/09 1112) Pulse Rate:  [97] 97 (08/09 1112) Resp:  [16] 16 (08/09 1112) BP: (179)/(88) 179/88 mmHg (08/09 1112) SpO2:  [99 %] 99 % (08/09 1112) Weight:  [82.101 kg (181 lb)] 82.101 kg (181 lb) (08/09 1142)  Cardiovascular: Skin warm; not flushed Respiratory: Breaths quiet; no shortness  of breath Abdomen: No masses Neurological: Normal sensation to touch Musculoskeletal: Normal motor function arms and legs Lymphatics: No inguinal adenopathy Skin: No rashes Genitourinary: Normal BUS  Laboratory Data:  Results for orders placed or performed during the hospital encounter of 01/16/15 (from the past 72 hour(s))  Type and screen     Status: None (Preliminary result)   Collection Time: 01/16/15 11:55 AM  Result Value Ref Range   ABO/RH(D) O NEG    Antibody Screen POS    Sample Expiration 01/19/2015    Antibody Identification ANTI D    DAT, IgG NEG    Unit Number [id number]     Blood Component Type RED CELLS,LR    Unit division 00    Status of Unit ALLOCATED    Transfusion Status OK TO TRANSFUSE    Crossmatch Result COMPATIBLE    Unit Number [id number]    Blood Component Type RED CELLS,LR    Unit division 00    Status of Unit ALLOCATED    Transfusion Status OK TO TRANSFUSE    Crossmatch Result COMPATIBLE    No results found for this or any previous visit (from the past 240 hour(s)). Creatinine:  Recent Labs  01/11/15 1100  CREATININE 1.02*    Xrays:  CLINICAL DATA: Chronic left lower quadrant abdominal pain.  EXAM: CT ABDOMEN AND PELVIS WITH CONTRAST  TECHNIQUE: Multidetector CT imaging of the abdomen and pelvis was performed using the standard protocol following bolus administration of intravenous contrast.  CONTRAST: 165m ISOVUE-300 IOPAMIDOL (ISOVUE-300) INJECTION 61%  COMPARISON: None.  FINDINGS: Multiple small nodules are noted in both lung bases concerning for metastatic disease. No significant osseous abnormality is noted.  Status post cholecystectomy. Multiple rounded low densities are noted in the hepatic parenchyma consistent with metastatic disease. The spleen and pancreas are unremarkable. Adrenal glands appear normal. Right kidney and ureter appear normal. Moderate left hydronephrosis is noted with dilatation of the proximal left ureter due to 6.8 x 5.3 x 3.8 cm mass in the left pelvis most consistent with ovarian malignancy. Simple cyst is noted in right ovary. Patient is status post hysterectomy. Small amount of free fluid is noted in the dependent portion of the pelvis. Sigmoid diverticulosis is noted without inflammation. The appendix appears normal. Left periaortic adenopathy is noted with the largest lymph node measuring 17 x 15 mm. This is consistent with metastatic disease.  IMPRESSION: Large left adnexal mass is noted most consistent with ovarian malignancy which results in moderate left  hydroureteronephrosis. Metastatic lesions are noted in the visualized lung bases and hepatic parenchyma. Left periaortic adenopathy is also noted concerning for malignancy.  These results will be called to the ordering clinician or representative by the Radiologist Assistant, and communication documented in the PACS or zVision Dashboard.   Electronically Signed  By: JMarijo Conception M.D.  On: 01/03/2015 09:39     Impression/Assessment:   Left ureteral obstruction originally [facility] to be [facility], but appears intraoperatively to be 2ndary to metastatic colon cancer ( frozen section). The Left ureter is completely obstructed, and needs JJ stenting for relief of obstruction prior to chemotherapy.   Plan:    Cysto and JJ stenting.   Travontae Freiberger I Judea Fennimore 01/16/2015, 3:11 PM

## 2015-01-16 NOTE — Op Note (Signed)
PATIENT: TORRIN FREIN DATE OF BIRTH:03-03-1961 ENCOUNTER DATE: 01/16/2015   Preop Diagnosis: Left adnexal mass suspicious for ovarian cancer. 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 + metastasis in the liver, left hydroureter with hydronephrosis  Surgery: Bilateral salpingo-oophorectomy, tumor debulking on the left. Under separate operative note, cystoscopy with left ureteral stent placement by Dr. Gaynelle Arabian.  Surgeons:  Lucita Lora. Alycia Rossetti, MD; Lahoma Crocker, MD   Anesthesia: General   Estimated blood loss: 250 ml  IVF: 2500 ml   Urine output: 536 ml   Complications: None   Pathology: Bilateral tubes and ovaries, ascites  Operative findings: 8 cm complex left adnexal mass densely adherent to the left pelvic sidewall and rectosigmoid colon. Left retroperitoneal fibrosis with dilated ureter to the pelvic brim. Surgically absent uterus. Normal right adnexa. Palpable intraparenchymal liver metastasis as well as tumor on the surface of the liver. No carcinomatosis, small volume ascites. Frozen section with a mucinous adenocarcinoma that phenotypically did not appear classic for an ovarian cancer. Would need to defer to permanent sections.  At the completion of the procedure, within the pelvis there was fibrosis of the left pelvic sidewall and retroperitoneum and thickening of the rectosigmoid colon with no obstruction. Difficult to ascertain if there as an intraluminal lesion due to the dense fibrosis of the colon. + liver metastasis. No other intrabdominal disease.  Procedure: The patient was identified in the preoperative holding area. Informed consent was signed on the chart. Patient was seen history was reviewed and exam was performed.   The patient was then taken to the operating room and placed in the supine position with SCD hose on. General anesthesia was then  induced without difficulty. She was then placed in the dorsolithotomy position. The abdomen was prepped with chlor prep sponges per protocol. Perineum was prepped with Betadine. The vagina was prepped with Betadine a Foley catheter was inserted into the bladder under sterile conditions.  The patient was then draped after the prep was dried. Timeout was performed the patient, procedure, antibiotic, allergy, and length of procedure. A vertical midline infraumbilical incision was and carried down to the underlying fascia using Bovie cautery. The fascia was scored in the fascial incision was extended superiorly and inferiorly using Bovie cautery. The rectus bellies were dissected off the overlying fascia. The peritoneum was tented and entered. The peritoneal incision was extended superiorly and inferiorly with visualization of the underlying peritoneal cavity. The Buchwalter self-retaining retractor was then placed. At the initial placement as well as at several points during the case the lateral blades were checked to ensure no significant pressure on the psoas bellies.  The small and large bowel were packed out of the way of the surgical field with moist laparotomy sponges and malleable retractors were attached to the [place]. Abdominal ascites as collected for cytology. The round ligament on the patient's left side was transected with monopolar cautery the posterior leaf of the broad ligament was opened. We encountered adhesions of the mass to the mesentery of the rectosigmoid colon and this was dissected away with care to not injury the blood supply. Once the medial attachments were released, we were able to deviate the mass from the sidewall and we identified what was felt to be the ureter and a vessel loop was placed. The IP was clamped, transected and ligated. The ovary was sent for frozen section. On the right side a similar procedure was performed, though the  ureter was easily visible as the anatomy was  normal.  Frozen returned as above. Secondary to the fibrosis and the hydroureter, urology was called in for consultation to evaluate for cystscopy and sent placement. Please refer to their separate operative note.   At the completion of the urologic procedure, we irrigated the pelvis. What we felt to be the ureter was the IP as the ureter was lower and more medial. The IP was then clipped. The pedicles were noted to be hemostatic. The abdomen pelvis were copiously irrigated. The retractor and laparotomy sponges were removed. The fascia was closed using running mass closure of #1 PDS. The subcutaneous tissues were irrigated and made hemostatic. 20 mL of Exparel within 20 mL of normal saline was injected for postoperative pain control. The subcutaneous tissues were reapproximated with 2.0 vicryl. The skin was closed with a subcuticular suture of 4.0 vicryl. Steri strips and a sterile dressing was applied.   All instrument, suture, laparotomy, Ray-Tec, and needle counts were correct x2. The patient tolerated the procedure well and was taken recovery room in stable condition. This is Nancy Marus dictating an operative note on KIMETHA TRULSON.

## 2015-01-16 NOTE — Anesthesia Procedure Notes (Signed)
Procedure Name: Intubation Date/Time: 01/16/2015 12:51 PM Performed by: Maxwell Caul Pre-anesthesia Checklist: Patient identified, Emergency Drugs available, Suction available and Patient being monitored Patient Re-evaluated:Patient Re-evaluated prior to inductionOxygen Delivery Method: Circle System Utilized Preoxygenation: Pre-oxygenation with 100% oxygen Intubation Type: IV induction Ventilation: Mask ventilation without difficulty Laryngoscope Size: Mac and 4 Grade View: Grade I Tube type: Oral Tube size: 7.5 mm Number of attempts: 1 Airway Equipment and Method: Stylet Placement Confirmation: ETT inserted through vocal cords under direct vision,  positive ETCO2 and breath sounds checked- equal and bilateral Secured at: 21 cm Tube secured with: Tape Dental Injury: Teeth and Oropharynx as per pre-operative assessment

## 2015-01-16 NOTE — Anesthesia Postprocedure Evaluation (Signed)
  Anesthesia Post-op Note  Patient: Brianna Vasquez  Procedure(s) Performed: Procedure(s) (LRB): EXPLORATORY LAPAROTOMY  (N/A) BILATERAL OOPHORECTOMY RESECTION OF PELVIC MASS  (Bilateral) CYSTOSCOPY WITH RETROGRADE PYELOGRAM/URETERAL STENT PLACEMENT (Left)  Patient Location: PACU  Anesthesia Type: General  Level of Consciousness: awake and alert   Airway and Oxygen Therapy: Patient Spontanous Breathing  Post-op Pain: mild  Post-op Assessment: Post-op Vital signs reviewed, Patient's Cardiovascular Status Stable, Respiratory Function Stable, Patent Airway and No signs of Nausea or vomiting  Last Vitals:  Filed Vitals:   01/16/15 1755  BP: 163/90  Pulse: 97  Temp: 36.3 C  Resp: 18    Post-op Vital Signs: stable   Complications: No apparent anesthesia complications

## 2015-01-17 ENCOUNTER — Encounter (HOSPITAL_COMMUNITY): Payer: Self-pay | Admitting: Gynecologic Oncology

## 2015-01-17 LAB — CBC
HCT: 31.7 % — ABNORMAL LOW (ref 36.0–46.0)
Hemoglobin: 10.2 g/dL — ABNORMAL LOW (ref 12.0–15.0)
MCH: 28.6 pg (ref 26.0–34.0)
MCHC: 32.2 g/dL (ref 30.0–36.0)
MCV: 88.8 fL (ref 78.0–100.0)
PLATELETS: 405 10*3/uL — AB (ref 150–400)
RBC: 3.57 MIL/uL — ABNORMAL LOW (ref 3.87–5.11)
RDW: 12.9 % (ref 11.5–15.5)
WBC: 16.1 10*3/uL — ABNORMAL HIGH (ref 4.0–10.5)

## 2015-01-17 LAB — BASIC METABOLIC PANEL
Anion gap: 7 (ref 5–15)
BUN: 12 mg/dL (ref 6–20)
CALCIUM: 8.5 mg/dL — AB (ref 8.9–10.3)
CHLORIDE: 102 mmol/L (ref 101–111)
CO2: 28 mmol/L (ref 22–32)
CREATININE: 0.92 mg/dL (ref 0.44–1.00)
GFR calc Af Amer: >60 mL/min (ref >60)
GFR calc non Af Amer: >60 mL/min (ref >60)
GLUCOSE: 158 mg/dL — AB (ref 65–99)
Potassium: 4.3 mmol/L (ref 3.5–5.1)
SODIUM: 137 mmol/L (ref 135–145)

## 2015-01-17 LAB — CEA: CEA: 30 ng/mL — ABNORMAL HIGH (ref 0.0–4.7)

## 2015-01-17 LAB — CA 125: CA 125: 32.8 U/mL (ref 0.0–38.1)

## 2015-01-17 MED ORDER — GABAPENTIN 300 MG PO CAPS
600.0000 mg | ORAL_CAPSULE | Freq: Three times a day (TID) | ORAL | Status: DC
  Administered 2015-01-17 – 2015-01-19 (×7): 600 mg via ORAL
  Filled 2015-01-17 (×9): qty 2

## 2015-01-17 MED ORDER — ALPRAZOLAM 0.25 MG PO TABS
0.2500 mg | ORAL_TABLET | ORAL | Status: DC | PRN
  Administered 2015-01-18 – 2015-01-19 (×6): 0.25 mg via ORAL
  Filled 2015-01-17 (×6): qty 1

## 2015-01-17 MED ORDER — DOCUSATE SODIUM 100 MG PO CAPS
100.0000 mg | ORAL_CAPSULE | Freq: Every day | ORAL | Status: DC
  Administered 2015-01-17 – 2015-01-19 (×3): 100 mg via ORAL
  Filled 2015-01-17 (×3): qty 1

## 2015-01-17 MED ORDER — ENOXAPARIN (LOVENOX) PATIENT EDUCATION KIT
PACK | Freq: Once | Status: AC
  Administered 2015-01-17: 16:00:00
  Filled 2015-01-17: qty 1

## 2015-01-17 NOTE — Progress Notes (Signed)
Attempted to get patient out of bed. Patient stated she is hurting too much to get out of bed at the moment. Patient did sat up on the side of the bed briefly but was limited due to pain and return back to bed. Will try again in the morning.

## 2015-01-17 NOTE — Progress Notes (Signed)
1 Day Post-Op Procedure(s) (LRB): EXPLORATORY LAPAROTOMY  (N/A) BILATERAL OOPHORECTOMY RESECTION OF PELVIC MASS  (Bilateral) CYSTOSCOPY WITH RETROGRADE PYELOGRAM/URETERAL STENT PLACEMENT (Left)  Subjective: Patient reports some improvement in moderate RUQ abdominal discomfort post-operatively.  Tolerating diet with no nausea or emesis.  Decreased appetite.  Adequate fluid intake reported.  Pain relieved with oxycodone.  Up to bedside commode with assist.  Requesting medication for her bowels since she had difficulty having bowel movements pre-op.  Asking about surgical findings.  Denies chest pain, dyspnea, passing flatus, or having a bowel movement.  Objective: Vital signs in last 24 hours: Temp:  [97.4 F (36.3 C)-98.6 F (37 C)] 97.8 F (36.6 C) (08/10 1002) Pulse Rate:  [91-103] 101 (08/10 1002) Resp:  [12-18] 18 (08/10 1002) BP: (133-173)/(61-90) 133/75 mmHg (08/10 1002) SpO2:  [96 %-100 %] 96 % (08/10 1002) Last BM Date:  (pta)  Intake/Output from previous day: 08/09 0701 - 08/10 0700 In: 3220 [P.O.:120; I.V.:3100] Out: 1120 [Urine:870; Blood:250]  Physical Examination: General: alert, cooperative and no distress Resp: clear to auscultation bilaterally Cardio: regular rate and rhythm, S1, S2 normal, no murmur, click, rub or gallop GI: incision: stainmarked dressing removed from the midline abdomen, steri strips with sanguinous drainage, incision cleansed and new 1/2 inch steri strips applied, no evidence of active bleeding or drainage and abdomen soft, tender, hypoactive bowel sounds, non-tympanic Extremities: extremities normal, atraumatic, no cyanosis or edema  Labs: WBC/Hgb/Hct/Plts:  16.1/10.2/31.7/405 (08/10 0536) BUN/Cr/glu/ALT/AST/amyl/lip:  12/0.92/--/--/--/--/-- (08/10 0536)  Assessment: 54 y.o. s/p Procedure(s): EXPLORATORY LAPAROTOMY  BILATERAL OOPHORECTOMY RESECTION OF PELVIC MASS  CYSTOSCOPY WITH RETROGRADE PYELOGRAM/URETERAL STENT PLACEMENT: stable Pain:   Pain is well-controlled on PRN medications.  Heme: Hgb 10.2 and Hct 31.7 this am.  Stable post-operatively.  CV: BP and HR stable post-operatively.  Continue to monitor with ordered vital signs.  GI:  Tolerating po: Yes     GU: Voided twice after foley removal.  Adequate output recorded.      FEN: Stable post-operatively.  Prophylaxis: pharmacologic prophylaxis (with any of the following: enoxaparin (Lovenox) 40mg  SQ 2 hours prior to surgery then every day) and intermittent pneumatic compression boots.  Plan: Diet as tolerated Saline lock IV Kpad Lovenox teaching kit for home use Encourage ambulation, IS use, deep breathing, and coughing Continue post-operative plan of care per Dr. Delsa Sale Pt will be discharged home when ready   LOS: 1 day    Brianna Vasquez 01/17/2015, 11:50 AM

## 2015-01-17 NOTE — Progress Notes (Signed)
Utilization review completed.  

## 2015-01-17 NOTE — Care Management Note (Signed)
Case Management Note  Patient Details  Name: Brianna Vasquez MRN: [id number] Date of Birth: [birth year]  Subjective/Objective:                  EXPLORATORY LAPAROTOMY (N/A) BILATERAL OOPHORECTOMY RESECTION OF PELVIC MASS (Bilateral) CYSTOSCOPY WITH RETROGRADE PYELOGRAM/URETERAL STENT PLACEMENT (Left)  Action/Plan: Discharge planning  Expected Discharge Date:                  Expected Discharge Plan:  Home/Self Care  In-House Referral:     Discharge planning Services  CM Consult  Post Acute Care Choice:    Choice offered to:     DME Arranged:    DME Agency:     HH Arranged:    [place] Agency:     Status of Service:  Completed, signed off  Medicare Important Message Given:    Date Medicare IM Given:    Medicare IM give by:    Date Additional Medicare IM Given:    Additional Medicare Important Message give by:     If discussed at [place] of Stay Meetings, dates discussed:    Additional Comments: CM notes pt is indep with all ADLs and no HH services have been recc or ordered.  No other CM needs were communicated. Dellie Catholic, RN 01/17/2015, 12:19 PM

## 2015-01-18 MED ORDER — MAGNESIUM HYDROXIDE 400 MG/5ML PO SUSP
30.0000 mL | Freq: Every day | ORAL | Status: DC
  Administered 2015-01-18: 30 mL via ORAL
  Filled 2015-01-18: qty 30

## 2015-01-18 NOTE — Progress Notes (Signed)
2 Days Post-Op Procedure(s) (LRB): EXPLORATORY LAPAROTOMY  (N/A) BILATERAL OOPHORECTOMY RESECTION OF PELVIC MASS  (Bilateral) CYSTOSCOPY WITH RETROGRADE PYELOGRAM/URETERAL STENT PLACEMENT (Left)  Subjective: Patient reports improvement in moderate RUQ abdominal discomfort post-operatively.  Tolerating diet with no nausea or emesis.  Appetite improving.  Adequate fluid intake reported.  Pain relieved with oxycodone and kpad use.  Ambulating to the bathroom with assist.  Walked a few steps in the hallway yesterday.  States she is limited due to abdominal pain.  Passed some flatus.  Denies chest pain, dyspnea, or having a bowel movement.  No concerns voiced.  Objective: Vital signs in last 24 hours: Temp:  [97.9 F (36.6 C)-98.5 F (36.9 C)] 97.9 F (36.6 C) (08/11 0537) Pulse Rate:  [88-99] 88 (08/11 0537) Resp:  [18] 18 (08/11 0537) BP: (120-151)/(54-85) 138/64 mmHg (08/11 0537) SpO2:  [96 %-98 %] 98 % (08/11 0537) Last BM Date: 01/15/15  Intake/Output from previous day: 08/10 0701 - 08/11 0700 In: -  Out: 350 [Urine:350]  Physical Examination: General: alert, cooperative and no distress Resp: clear to auscultation bilaterally Cardio: regular rate and rhythm, S1, S2 normal, no murmur, click, rub or gallop GI: incision: midline abdomen with steri strips, no evidence of active bleeding or drainage and abdomen soft, non-tender, active bowel sounds, non-tympanic Extremities: extremities normal, atraumatic, no cyanosis or edema  Assessment: 54 y.o. s/p Procedure(s): EXPLORATORY LAPAROTOMY  BILATERAL OOPHORECTOMY RESECTION OF PELVIC MASS  CYSTOSCOPY WITH RETROGRADE PYELOGRAM/URETERAL STENT PLACEMENT: stable Pain:  Pain is well-controlled on PRN medications.  Heme: Hgb 10.2 and Hct 31.7 01/17/15 am.  Stable post-operatively.  CV: BP and HR stable post-operatively.  Continue to monitor with ordered vital signs.  GI:  Tolerating po: Yes.     GU: Adequate output recorded.      FEN:  Stable post-operatively.  Prophylaxis: pharmacologic prophylaxis (with any of the following: enoxaparin (Lovenox) 40mg  SQ 2 hours prior to surgery then every day) and intermittent pneumatic compression boots.  Plan: Diet as tolerated Encourage ambulation Milk of magnesia ordered Lovenox teaching kit for home use Encourage ambulation, IS use, deep breathing, and coughing Continue post-operative plan of care per Dr. Delsa Sale Pt will be discharged home when ready   LOS: 2 days    Brianna Vasquez DEAL 01/18/2015, 10:51 AM

## 2015-01-19 ENCOUNTER — Other Ambulatory Visit: Payer: Self-pay | Admitting: *Deleted

## 2015-01-19 MED ORDER — GABAPENTIN 300 MG PO CAPS: 600.0000 mg | ORAL_CAPSULE | Freq: Three times a day (TID) | ORAL | Status: DC

## 2015-01-19 MED ORDER — OXYCODONE HCL 5 MG PO TABS: 5.0000 mg | ORAL_TABLET | ORAL | Status: DC | PRN

## 2015-01-19 MED ORDER — ENOXAPARIN SODIUM 40 MG/0.4ML ~~LOC~~ SOLN: 40.0000 mg | SUBCUTANEOUS | Status: DC

## 2015-01-19 MED ORDER — ALPRAZOLAM 0.5 MG PO TABS
0.5000 mg | ORAL_TABLET | Freq: Three times a day (TID) | ORAL | Status: DC | PRN
Start: 2015-01-19 — End: 2015-01-29

## 2015-01-19 NOTE — Discharge Summary (Signed)
Physician Discharge Summary  Patient ID: Brianna Vasquez MRN: [id number] DOB/AGE: Oct 14, 1960 54 y.o.  Admit date: 01/16/2015 Discharge date: 01/19/2015  Admission Diagnoses: Ovarian mass  Discharge Diagnoses:  Principal Problem:   Ovarian mass Active Problems:   Pelvic mass in female   Discharged Condition:  The patient is in good condition and stable for discharge.    Hospital Course: On 01/16/2015, the patient underwent the following: Procedure(s): EXPLORATORY LAPAROTOMY, BILATERAL OOPHORECTOMY RESECTION OF PELVIC MASS , CYSTOSCOPY WITH RETROGRADE PYELOGRAM/URETERAL STENT PLACEMENT.  The postoperative course was uneventful.  She was discharged to home on postoperative day 3 tolerating a regular diet, having bowel movements, passing flatus, pain controlled with PO pain medications, and voiding without difficulty.  She will be discharged home with lovenox for a total of 28 days post-op.  She has appointments to meet with Dr. Evlyn Clines as well for initiation of adjuvant chemotherapy.  Consults: None  Significant Diagnostic Studies: None  Treatments: surgery: see above  Discharge Exam: Blood pressure 134/48, pulse 96, temperature 98.6 F (37 C), temperature source Oral, resp. rate 18, height 5\' 5"  (1.651 m), weight 181 lb (82.101 kg), SpO2 98 %. General appearance: alert, cooperative and no distress Resp: clear to auscultation bilaterally Cardio: regular rate and rhythm, S1, S2 normal, no murmur, click, rub or gallop GI: soft, non-tender; bowel sounds normal; no masses,  no organomegaly Extremities: extremities normal, atraumatic, no cyanosis or edema Incision/Wound: Midline incision with steri strips intact with no erythema or drainage  Disposition: 01-Home or Self Care  Discharge Instructions    Call MD for:  difficulty breathing, headache or visual disturbances    Complete by:  As directed      Call MD for:  extreme fatigue    Complete by:  As directed      Call MD for:   hives    Complete by:  As directed      Call MD for:  persistant dizziness or light-headedness    Complete by:  As directed      Call MD for:  persistant nausea and vomiting    Complete by:  As directed      Call MD for:  redness, tenderness, or signs of infection (pain, swelling, redness, odor or green/yellow discharge around incision site)    Complete by:  As directed      Call MD for:  severe uncontrolled pain    Complete by:  As directed      Call MD for:  temperature >100.4    Complete by:  As directed      Diet - low sodium heart healthy    Complete by:  As directed      Driving Restrictions    Complete by:  As directed   No driving for 2 weeks.  Do not take narcotics and drive.     Increase activity slowly    Complete by:  As directed      Lifting restrictions    Complete by:  As directed   No lifting greater than 10 lbs.     Sexual Activity Restrictions    Complete by:  As directed   No sexual activity, nothing in the vagina, for 6 weeks.            Medication List    STOP taking these medications        HYDROcodone-ibuprofen 7.5-200 MG per tablet  Commonly known as:  VICOPROFEN      TAKE these medications  ALPRAZolam 0.5 MG tablet  Commonly known as:  XANAX  Take 1 tablet (0.5 mg total) by mouth every 8 (eight) hours as needed for anxiety.     b complex vitamins tablet  Take 1 tablet by mouth daily.     calcium carbonate 500 MG chewable tablet  Commonly known as:  TUMS - dosed in mg elemental calcium  Chew 1-2 tablets by mouth 3 (three) times daily as needed for indigestion or heartburn.     enoxaparin 40 MG/0.4ML injection  Commonly known as:  LOVENOX  Inject 0.4 mLs (40 mg total) into the skin daily.     gabapentin 300 MG capsule  Commonly known as:  NEURONTIN  Take 2 capsules (600 mg total) by mouth 3 (three) times daily.     GINGER ROOT PO  Take 1 capsule by mouth daily.     GREEN TEA EXTRACT PO  Take 1 capsule by mouth daily.      oxyCODONE 5 MG immediate release tablet  Commonly known as:  Oxy IR/ROXICODONE  Take 1-3 tablets (5-15 mg total) by mouth every 4 (four) hours as needed for severe pain.     traMADol 50 MG tablet  Commonly known as:  ULTRAM  Take 1-2 tablets by mouth every 6 hours as needed for pain.           Follow-up Information    Follow up with Donaciano Eva, MD On 01/22/2015.   Specialty:  Obstetrics and Gynecology   Why:  at 1:15pm at the [facility] information:   [place] [address] [phone]       Follow up with Gordy Levan, MD On 01/29/2015.   Specialty:  Oncology   Why:  at 11:30am at the [facility].  Please review additional appointments for financial, chemo education, and lab   Contact information:   [place] [id number] [phone]       Greater than thirty minutes were spend for face to face discharge instructions and discharge orders/summary in EPIC.   Signed: Lilymae Swiech DEAL 01/19/2015, 9:25 AM

## 2015-01-19 NOTE — Progress Notes (Addendum)
Patient had some bleeding from bladder.  Resent surgery of Cystoscopy with retrograde pyelogram/ureteral stent placement.  Will  Place a new hat in commode to assess and measure  amount of bleeding.  Nurse will continue to monitor patient for any new/ further change in condition  Roland Rack, RN 01/19/15 0043.  Marland Kitchen

## 2015-01-19 NOTE — Progress Notes (Signed)
Pt's vitals and incision site WNL. Patient tolerating diet and pain is controlled. Discussed discharge instructions with both patient and patient's family members. Neither had any questions nor complaints. Discharged to home.

## 2015-01-19 NOTE — Telephone Encounter (Signed)
Received call from patient requesting refill on xanax tablets. She states she forgot to tell Joylene John, NP this morning when she was rounding in the hospital. Per Joylene John, NP refill called into patient's pharmacy. Left VM for patient that this has been done and instructed her to not take the xanax and oxycodone at the same time and to call our office back with any additional questions or concerns.

## 2015-01-19 NOTE — Discharge Instructions (Signed)
01/19/2015  Return to work: 4-6 weeks if applicable  Activity: 1. Be up and out of the bed during the day.  Take a nap if needed.  You may walk up steps but be careful and use the hand rail.  Stair climbing will tire you more than you think, you may need to stop part way and rest.   2. No lifting or straining for 6 weeks.  3. No driving for 2 week(s).  Do not drive if you are taking narcotic pain medicine.  4. Shower daily.  Use soap and water on your incision and pat dry; don't rub.  No tub baths until cleared by your surgeon.   5. No sexual activity and nothing in the vagina for 6 weeks.  Diet: 1. Low sodium Heart Healthy Diet is recommended.  2. It is safe to use a laxative, such as Miralax or Colace, if you have difficulty moving your bowels.   Wound Care: 1. Keep clean and dry.  Shower daily.  Reasons to call the Doctor:  Fever - Oral temperature greater than 100.4 degrees Fahrenheit  Foul-smelling vaginal discharge  Difficulty urinating  Nausea and vomiting  Increased pain at the site of the incision that is unrelieved with pain medicine.  Difficulty breathing with or without chest pain  New calf pain especially if only on one side  Sudden, continuing increased vaginal bleeding with or without clots.   Contacts: For questions or concerns you should contact:  Dr. Alycia Rossetti at [facility]  Dr. Everitt Amber at [phone]  Joylene John, NP at [phone]  After Hours: call [phone] and ask for the GYN Oncologist on call  Gabapentin capsules or tablets What is this medicine? GABAPENTIN (GA ba pen tin) is used to control partial seizures in adults with epilepsy. It is also used to treat certain types of nerve pain. This medicine may be used for other purposes; ask your health care provider or pharmacist if you have questions. COMMON BRAND NAME(S): Orpha Bur, Neurontin What should I tell my health care provider before I take this medicine? They need  to know if you have any of these conditions: -kidney disease -suicidal thoughts, plans, or attempt; a previous suicide attempt by you or a family member -an unusual or allergic reaction to gabapentin, other medicines, foods, dyes, or preservatives -pregnant or trying to get pregnant -breast-feeding How should I use this medicine? Take this medicine by mouth with a glass of water. Follow the directions on the prescription label. You can take it with or without food. If it upsets your stomach, take it with food.Take your medicine at regular intervals. Do not take it more often than directed. Do not stop taking except on your doctor's advice. If you are directed to break the 600 or 800 mg tablets in half as part of your dose, the extra half tablet should be used for the next dose. If you have not used the extra half tablet within 28 days, it should be thrown away. A special MedGuide will be given to you by the pharmacist with each prescription and refill. Be sure to read this information carefully each time. Talk to your pediatrician regarding the use of this medicine in children. Special care may be needed. Overdosage: If you think you have taken too much of this medicine contact a poison control center or emergency room at once. NOTE: This medicine is only for you. Do not share this medicine with others. What if I miss a dose? If you miss  a dose, take it as soon as you can. If it is almost time for your next dose, take only that dose. Do not take double or extra doses. What may interact with this medicine? Do not take this medicine with any of the following medications: -other gabapentin products This medicine may also interact with the following medications: -alcohol -antacids -antihistamines for allergy, cough and cold -certain medicines for anxiety or sleep -certain medicines for depression or psychotic disturbances -homatropine; hydrocodone -naproxen -narcotic medicines (opiates) for  pain -phenothiazines like chlorpromazine, mesoridazine, prochlorperazine, thioridazine This list may not describe all possible interactions. Give your health care provider a list of all the medicines, herbs, non-prescription drugs, or dietary supplements you use. Also tell them if you smoke, drink alcohol, or use illegal drugs. Some items may interact with your medicine. What should I watch for while using this medicine? Visit your doctor or health care professional for regular checks on your progress. You may want to keep a record at home of how you feel your condition is responding to treatment. You may want to share this information with your doctor or health care professional at each visit. You should contact your doctor or health care professional if your seizures get worse or if you have any new types of seizures. Do not stop taking this medicine or any of your seizure medicines unless instructed by your doctor or health care professional. Stopping your medicine suddenly can increase your seizures or their severity. Wear a medical identification bracelet or chain if you are taking this medicine for seizures, and carry a card that lists all your medications. You may get drowsy, dizzy, or have blurred vision. Do not drive, use machinery, or do anything that needs mental alertness until you know how this medicine affects you. To reduce dizzy or fainting spells, do not sit or stand up quickly, especially if you are an older patient. Alcohol can increase drowsiness and dizziness. Avoid alcoholic drinks. Your mouth may get dry. Chewing sugarless gum or sucking hard candy, and drinking plenty of water will help. The use of this medicine may increase the chance of suicidal thoughts or actions. Pay special attention to how you are responding while on this medicine. Any worsening of mood, or thoughts of suicide or dying should be reported to your health care professional right away. Women who become pregnant  while using this medicine may enroll in the [place] Pregnancy Registry by calling [phone]. This registry collects information about the safety of antiepileptic drug use during pregnancy. What side effects may I notice from receiving this medicine? Side effects that you should report to your doctor or health care professional as soon as possible: -allergic reactions like skin rash, itching or hives, swelling of the face, lips, or tongue -worsening of mood, thoughts or actions of suicide or dying Side effects that usually do not require medical attention (report to your doctor or health care professional if they continue or are bothersome): -constipation -difficulty walking or controlling muscle movements -dizziness -nausea -slurred speech -tiredness -tremors -weight gain This list may not describe all possible side effects. Call your doctor for medical advice about side effects. You may report side effects to FDA at 1-800-FDA-1088. Where should I keep my medicine? Keep out of reach of children. Store at room temperature between 15 and 30 degrees C (59 and 86 degrees F). Throw away any unused medicine after the expiration date. NOTE: This sheet is a summary. It may not cover all possible  information. If you have questions about this medicine, talk to your doctor, pharmacist, or health care provider.  2015, Elsevier/Gold Standard. (2013-01-27 09:12:48)  Oxycodone tablets or capsules What is this medicine? OXYCODONE (ox i KOE done) is a pain reliever. It is used to treat moderate to severe pain. This medicine may be used for other purposes; ask your health care provider or pharmacist if you have questions. COMMON BRAND NAME(S): Dazidox, Endocodone, OXECTA, OxyIR, Percolone, Roxicodone What should I tell my health care provider before I take this medicine? They need to know if you have any of these conditions: -Addison's disease -brain tumor -drug abuse or  addiction -head injury -heart disease -if you frequently drink alcohol containing drinks -kidney disease or problems going to the bathroom -liver disease -lung disease, asthma, or breathing problems -mental problems -an unusual or allergic reaction to oxycodone, codeine, hydrocodone, morphine, other medicines, foods, dyes, or preservatives -pregnant or trying to get pregnant -breast-feeding How should I use this medicine? Take this medicine by mouth with a glass of water. Follow the directions on the prescription label. You can take it with or without food. If it upsets your stomach, take it with food. Take your medicine at regular intervals. Do not take it more often than directed. Do not stop taking except on your doctor's advice. Some brands of this medicine, like Oxecta, have special instructions. Ask your doctor or pharmacist if these directions are for you: Do not cut, crush or chew this medicine. Swallow only one tablet at a time. Do not wet, soak, or lick the tablet before you take it. Talk to your pediatrician regarding the use of this medicine in children. Special care may be needed. Overdosage: If you think you have taken too much of this medicine contact a poison control center or emergency room at once. NOTE: This medicine is only for you. Do not share this medicine with others. What if I miss a dose? If you miss a dose, take it as soon as you can. If it is almost time for your next dose, take only that dose. Do not take double or extra doses. What may interact with this medicine? -alcohol -antihistamines -certain medicines used for nausea like chlorpromazine, droperidol -erythromycin -ketoconazole -medicines for depression, anxiety, or psychotic disturbances -medicines for sleep -muscle relaxants -naloxone -naltrexone -narcotic medicines (opiates) for pain -nilotinib -phenobarbital -phenytoin -rifampin -ritonavir -voriconazole This list may not describe all possible  interactions. Give your health care provider a list of all the medicines, herbs, non-prescription drugs, or dietary supplements you use. Also tell them if you smoke, drink alcohol, or use illegal drugs. Some items may interact with your medicine. What should I watch for while using this medicine? Tell your doctor or health care professional if your pain does not go away, if it gets worse, or if you have new or a different type of pain. You may develop tolerance to the medicine. Tolerance means that you will need a higher dose of the medicine for pain relief. Tolerance is normal and is expected if you take this medicine for a long time. Do not suddenly stop taking your medicine because you may develop a severe reaction. Your body becomes used to the medicine. This does NOT mean you are addicted. Addiction is a behavior related to getting and using a drug for a non-medical reason. If you have pain, you have a medical reason to take pain medicine. Your doctor will tell you how much medicine to take. If your doctor wants  you to stop the medicine, the dose will be slowly lowered over time to avoid any side effects. You may get drowsy or dizzy when you first start taking this medicine or change doses. Do not drive, use machinery, or do anything that may be dangerous until you know how the medicine affects you. Stand or sit up slowly. There are different types of narcotic medicines (opiates) for pain. If you take more than one type at the same time, you may have more side effects. Give your health care provider a list of all medicines you use. Your doctor will tell you how much medicine to take. Do not take more medicine than directed. Call emergency for help if you have problems breathing. This medicine will cause constipation. Try to have a bowel movement at least every 2 to 3 days. If you do not have a bowel movement for 3 days, call your doctor or health care professional. Your mouth may get dry. Drinking water,  chewing sugarless gum, or sucking on hard candy may help. See your dentist every 6 months. What side effects may I notice from receiving this medicine? Side effects that you should report to your doctor or health care professional as soon as possible: -allergic reactions like skin rash, itching or hives, swelling of the face, lips, or tongue -breathing problems -confusion -feeling faint or lightheaded, falls -trouble passing urine or change in the amount of urine -unusually weak or tired Side effects that usually do not require medical attention (report to your doctor or health care professional if they continue or are bothersome): -constipation -dry mouth -itching -nausea, vomiting -upset stomach This list may not describe all possible side effects. Call your doctor for medical advice about side effects. You may report side effects to FDA at 1-800-FDA-1088. Where should I keep my medicine? Keep out of the reach of children. This medicine can be abused. Keep your medicine in a safe place to protect it from theft. Do not share this medicine with anyone. Selling or giving away this medicine is dangerous and against the law. Store at room temperature between 15 and 30 degrees C (59 and 86 degrees F). Protect from light. Keep container tightly closed. This medicine may cause accidental overdose and death if it is taken by other adults, children, or pets. Flush any unused medicine down the toilet to reduce the chance of harm. Do not use the medicine after the expiration date. NOTE: This sheet is a summary. It may not cover all possible information. If you have questions about this medicine, talk to your doctor, pharmacist, or health care provider.  2015, Elsevier/Gold Standard. (2013-02-03 13:43:33)

## 2015-01-20 LAB — TYPE AND SCREEN
ABO/RH(D): O NEG
Antibody Screen: POSITIVE
DAT, IGG: NEGATIVE
UNIT DIVISION: 0
Unit division: 0

## 2015-01-22 ENCOUNTER — Encounter: Payer: Self-pay | Admitting: Gynecologic Oncology

## 2015-01-22 ENCOUNTER — Ambulatory Visit: Payer: BLUE CROSS/BLUE SHIELD | Attending: Gynecologic Oncology | Admitting: Gynecologic Oncology

## 2015-01-22 VITALS — BP 158/60 | HR 103 | Temp 98.6°F | Resp 18 | Ht 65.0 in | Wt 182.0 lb

## 2015-01-22 DIAGNOSIS — C801 Malignant (primary) neoplasm, unspecified: Secondary | ICD-10-CM | POA: Insufficient documentation

## 2015-01-22 DIAGNOSIS — C569 Malignant neoplasm of unspecified ovary: Secondary | ICD-10-CM | POA: Diagnosis not present

## 2015-01-22 DIAGNOSIS — G8918 Other acute postprocedural pain: Secondary | ICD-10-CM | POA: Diagnosis not present

## 2015-01-22 DIAGNOSIS — C787 Secondary malignant neoplasm of liver and intrahepatic bile duct: Secondary | ICD-10-CM | POA: Diagnosis not present

## 2015-01-22 MED ORDER — OXYCODONE HCL 5 MG PO TABS: 5.0000 mg | ORAL_TABLET | ORAL | Status: DC | PRN

## 2015-01-22 NOTE — Progress Notes (Signed)
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   S/p ex lap and BSO on 01/09/15.   Plan: 1) Pathology reports reviewed today 2) Treatment counseling - 45 minutes of direct face to face counseling was spent with the patient which included review of her pathology records and review of her imaging results (including reviewing the CT images with the patient to view). She was given the opportunity to ask questions, which were answered to her satisfaction.  I explained to the patient and her family that she apparently has a clinical stage IV GI mucinous adenocarcinoma. Based on the findings at surgery with substantial left lower quadrant peritoneal and retroperitoneal involvement is almost certainly a colonic primary (left colonic). Additional factors which support this include her history of bright red rectal bleeding which she reports to me as occurring in the last 2 months. Further supporting evidence is review of her screening colonoscopy in April 2016 which revealed a somewhat limited and abnormal exam" "the colonoscopy was technically difficult and complex due to abnormal anatomy, multiple diverticula in the colon and restricted mobility of the colon..... The quality of the bowel preparation was good. There was associated thickening and fixation of the colon as well as angulation possibly related to adhesions from her prior hysterectomy, making advancement very difficult."  However I believe it is important to pursue upper GI endoscopy to rule out an upper GI source for her apparent metastatic disease. This has been scheduled.  I discussed with the patient and her family that she does not have a primary ovarian cancer, and we reviewed the pathology reports with the supporting immunohistochemistry stains. I discussed that we have a GI oncologic expert in Dr. Julieanne Manson at [facility] and we will facilitate referral.  She  is cleared to start chemotherapy from a surgical recovery standpoint.  3)  Return to clinic on a prn basis for acute postop needs.  HPI:  Brianna Vasquez is a 54 y.o. year female seen on 01/05/15 in consultation at the request of Dr. Zadie Rhine regarding management of a newly diagnosed left pelvic mass, hydronephrosis, liver metastases, and lung metastases. The patient first noted the mass in the left lower quadrant. CT scan was obtained showing the above-noted findings as well as a enlarged periaortic lymph node and multiple lung base nodules and multiple liver parenchymal metastases. There is no evidence of ascites. Patient previously had a hysterectomy for menorrhagia and uterine fibroids. She has no other significant gynecologic history.  She has a family history of a paternal cousin with breast cancer who has a BRCA mutation. She has a paternal aunt with breast cancer and her father died of pancreatic cancer. The patient her self has not been tested for BRCA.  The present time the patient has some pain left lower quadrant. She denies any flank pain. She has urinary frequency. She is noted that she has more constipation in recent months where previously she had more diarrhea consistent with irritable bowel syndrome. She reported episodes of bright red rectal bleeding in late July.  The patient had colonoscopy in April 2016 which was limited due to anatomic distortion of her sigmoid colon (felt to be secondary to postop adhesions and diverticular disease).  Preoperative tumor markers revealed a CA 125 of 32.8 And a CEA of 30.  She was taken to the OR by Dr Nancy Marus on 01/09/15 and an exploratory laparotomy and BSO was performed. Intraoperative  findings included an 8 cm complex left adnexal mass densely adherent to the left pelvic sidewall and rectosigmoid colon. Left retroperitoneal fibrosis with dilated ureters to the pelvic brim. Surgically absent uterus. Normal right adnexa. Palpable  intraparenchymal liver metastases as well as tumor on the surface of the liver. No carcinomatosis or ascites. Frozen section consistent with metastatic mucinous adenocarcinoma. A ureteral stent was placed on the left during the surgery.  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 Immunostains showed positivity to CD X2, CEA, and cytokeratin 7. Immunostains negative for ER, PR, P5 04S, WT 1 and cytokeratin 20.  She is seen today for a postoperative check and to discuss her pathology results and ongoing plan.  Since discharge from the hospital, she is feeling well. She has improving appetite, normal bowel and bladder function, and pain controlled with minimal PO medication. She has no other complaints today.  Current Outpatient Prescriptions on File Prior to Visit  Medication Sig Dispense Refill  . ALPRAZolam (XANAX) 0.5 MG tablet Take 1 tablet (0.5 mg total) by mouth every 8 (eight) hours as needed for anxiety. 30 tablet 0  . enoxaparin (LOVENOX) 40 MG/0.4ML injection Inject 0.4 mLs (40 mg total) into the skin daily. 25 Syringe 0  . gabapentin (NEURONTIN) 300 MG capsule Take 2 capsules (600 mg total) by mouth 3 (three) times daily. 90 capsule 3  . b complex vitamins tablet Take 1 tablet by mouth daily.    . calcium carbonate (TUMS - DOSED IN MG ELEMENTAL CALCIUM) 500 MG chewable tablet Chew 1-2 tablets by mouth 3 (three) times daily as needed for indigestion or heartburn.    . Ginger, Zingiber officinalis, (GINGER ROOT PO) Take 1 capsule by mouth daily.    Nyoka Cowden Tea, Camillia sinensis, (GREEN TEA EXTRACT PO) Take 1 capsule by mouth daily.     No current facility-administered medications on file prior to visit.   Allergies  Allergen Reactions  . Darvon [Propoxyphene]     Hallucinations   . Other Swelling    Adhesive Glue-Caused rash and burning.    Past Medical History  Diagnosis Date  . Wears glasses   .  Diverticulosis     dx 09/2014  . Esophagus hernia     hx of  . GERD (gastroesophageal reflux disease)     once in a while  . Anemia     hx of, none since hysterectomy  . Headache     severe, for last few mornings  . Cancer     "ovary tumor-on colon, kidney, spots on liver and lungs"  . Anxiety     recent, situational   Past Surgical History  Procedure Laterality Date  . Wisdom tooth extraction  ~2001  . Cholecystectomy  2007    lap choli  . Breast lumpectomy Right 10/2013  . Vaginal hysterectomy  2005  . Colonoscopy  2016  . Laparotomy N/A 01/16/2015    Procedure: EXPLORATORY LAPAROTOMY ;  Surgeon: Nancy Marus, MD;  Location: [facility];  Service: Gynecology;  Laterality: N/A;  . Oophorectomy Bilateral 01/16/2015    Procedure: BILATERAL OOPHORECTOMY RESECTION OF PELVIC MASS ;  Surgeon: Nancy Marus, MD;  Location: [facility];  Service: Gynecology;  Laterality: Bilateral;  . Cystoscopy w/ ureteral stent placement Left 01/16/2015    Procedure: CYSTOSCOPY WITH RETROGRADE PYELOGRAM/URETERAL STENT PLACEMENT;  Surgeon: Carolan Clines, MD;  Location: [facility];  Service: Urology;  Laterality: Left;   Family History  Problem Relation Age of Onset  . Cancer Father 68    pancreatic  . Cancer Paternal Aunt 23    breast   . Cancer Paternal Grandmother 56    breast/mastectomy  . Colon cancer Maternal Grandfather   . Alzheimer's disease Paternal Grandfather    Social History   Social History  . Marital Status: Single    Spouse Name: N/A  . Number of Children: N/A  . Years of Education: N/A   Occupational History  . Not on file.   Social History Main Topics  . Smoking status: Former Smoker    Types: Cigarettes    Quit date: 10/07/1985  . Smokeless tobacco: Never Used  . Alcohol Use: Yes     Comment: glass nightly  . Drug Use: No  . Sexual Activity: Not on file   Other Topics Concern  . Not on file   Social History Narrative    Review of systems: Constitutional:  She has no  weight gain or weight loss. She has no fever or chills. Eyes: No blurred vision Ears, Nose, Mouth, Throat: No dizziness, headaches or changes in hearing. No mouth sores. Cardiovascular: No chest pain, palpitations or edema. Respiratory:  No shortness of breath, wheezing or cough Gastrointestinal: She has normal bowel movements without diarrhea or constipation. She denies any nausea or vomiting. She denies blood in her stool or heart burn. Genitourinary:  See HPI. + hematuria since stent placement Musculoskeletal: Denies muscle weakness or joint pains.  Skin:  She has no skin changes, rashes or itching Neurological:  Denies dizziness or headaches. No neuropathy, no numbness or tingling. Psychiatric:  She denies depression or anxiety. Hematologic/Lymphatic:   No easy bruising or bleeding   Physical Exam: Blood pressure 158/60, pulse 103, temperature 98.6 F (37 C), temperature source Oral, resp. rate 18, height 5' 5" (1.651 m), weight 182 lb (82.555 kg), SpO2 100 %. General: Well dressed, well nourished in no apparent distress.   HEENT:  Normocephalic and atraumatic, no lesions.  Extraocular muscles intact. Sclerae anicteric. Pupils equal, round, reactive. No mouth sores or ulcers. Thyroid is normal size, not nodular, midline. Skin:  No lesions or rashes. Breasts:  deferred Lungs:  deferred Cardiovascular:  deferred Abdomen:  Soft, nontender, nondistended.  No palpable masses.  No hepatosplenomegaly.  No ascites. Normal bowel sounds.  No hernias.  Incisions is clean ,dry and intact with steristrips and no signs of infection or drainage. Genitourinary: deferred Extremities: deferred Psychiatric: Mood and affect are appropriate. Neurological: Awake, alert and oriented x 3. Sensation is intact, no neuropathy.  Musculoskeletal: deferred  Donaciano Eva, MD

## 2015-01-22 NOTE — Patient Instructions (Signed)
You will receive a phone call from the [facility] with an appointment to with Dr. Julieanne Manson, Medical Oncologist.  Plan to see Dr. Delma Officer in the office on August 19th at 2:30pm to discuss plans for upper endoscopy.  You can look at the [place] Diet recommended by Dr. Denman George and we also have a nutritionist at the [facility] as well.  Please call for any questions or concerns.

## 2015-01-23 ENCOUNTER — Telehealth: Payer: Self-pay | Admitting: Oncology

## 2015-01-23 NOTE — Telephone Encounter (Signed)
New patient appt-s/w patient and gave np appt for 08/22 @ 1:30 w/Dr. Benay Spice. Referring Joylene John Dx- Mucinous Adenocarcinoma

## 2015-01-26 ENCOUNTER — Ambulatory Visit: Payer: BLUE CROSS/BLUE SHIELD

## 2015-01-26 ENCOUNTER — Encounter: Payer: Self-pay | Admitting: *Deleted

## 2015-01-26 ENCOUNTER — Other Ambulatory Visit: Payer: BLUE CROSS/BLUE SHIELD

## 2015-01-26 NOTE — Progress Notes (Signed)
[place] Work  Clinical Social Work was referred by Therapist, sports for assessment of psychosocial needs.  Clinical Social Worker contacted via phone to check in with patient,  to offer support and assess for needs.  CSW introduced self and briefly explained role of CSW team. Pt out to lunch and not able to talk via phone. CSW made pt aware CSW team would be checking in at upcoming appt on 01/29/15. She appreciated call.   Clinical Social Work interventions: Resource education Loren Racer, [place] Worker [place]  [place] Phone: [phone] Fax: [phone]

## 2015-01-29 ENCOUNTER — Ambulatory Visit (HOSPITAL_BASED_OUTPATIENT_CLINIC_OR_DEPARTMENT_OTHER): Payer: BLUE CROSS/BLUE SHIELD | Admitting: Oncology

## 2015-01-29 ENCOUNTER — Ambulatory Visit: Payer: BLUE CROSS/BLUE SHIELD

## 2015-01-29 ENCOUNTER — Telehealth: Payer: Self-pay | Admitting: Oncology

## 2015-01-29 ENCOUNTER — Other Ambulatory Visit: Payer: BLUE CROSS/BLUE SHIELD

## 2015-01-29 ENCOUNTER — Encounter: Payer: Self-pay | Admitting: Oncology

## 2015-01-29 ENCOUNTER — Other Ambulatory Visit: Payer: Self-pay | Admitting: Gastroenterology

## 2015-01-29 ENCOUNTER — Telehealth: Payer: Self-pay | Admitting: *Deleted

## 2015-01-29 ENCOUNTER — Encounter: Payer: Self-pay | Admitting: *Deleted

## 2015-01-29 ENCOUNTER — Ambulatory Visit: Payer: BLUE CROSS/BLUE SHIELD | Admitting: Oncology

## 2015-01-29 VITALS — BP 138/67 | HR 91 | Temp 98.6°F | Resp 18 | Ht 65.0 in | Wt 178.9 lb

## 2015-01-29 DIAGNOSIS — C801 Malignant (primary) neoplasm, unspecified: Secondary | ICD-10-CM

## 2015-01-29 DIAGNOSIS — C787 Secondary malignant neoplasm of liver and intrahepatic bile duct: Secondary | ICD-10-CM | POA: Diagnosis not present

## 2015-01-29 DIAGNOSIS — R600 Localized edema: Secondary | ICD-10-CM

## 2015-01-29 DIAGNOSIS — G8918 Other acute postprocedural pain: Secondary | ICD-10-CM

## 2015-01-29 DIAGNOSIS — C7962 Secondary malignant neoplasm of left ovary: Secondary | ICD-10-CM

## 2015-01-29 DIAGNOSIS — C7982 Secondary malignant neoplasm of genital organs: Secondary | ICD-10-CM | POA: Diagnosis not present

## 2015-01-29 DIAGNOSIS — C7800 Secondary malignant neoplasm of unspecified lung: Secondary | ICD-10-CM

## 2015-01-29 DIAGNOSIS — Z809 Family history of malignant neoplasm, unspecified: Secondary | ICD-10-CM

## 2015-01-29 MED ORDER — ALPRAZOLAM 0.5 MG PO TABS: 0.5000 mg | ORAL_TABLET | Freq: Three times a day (TID) | ORAL | Status: DC | PRN

## 2015-01-29 MED ORDER — OXYCODONE HCL 5 MG PO TABS: 5.0000 mg | ORAL_TABLET | ORAL | Status: DC | PRN

## 2015-01-29 NOTE — Telephone Encounter (Signed)
Tiffany from IR called re: "Okay to hold Lovenox x24 hours prior to port insertion?"  Per Dr. Benay Spice; Faythe Ghee to hold lovenox x24 hours prior to port placement and pt to start back the day after placement.

## 2015-01-29 NOTE — Progress Notes (Signed)
Oncology Nurse Navigator Documentation  Oncology Nurse Navigator Flowsheets 01/29/2015  Referral date to RadOnc/MedOnc 01/22/2015  Navigator Encounter Type Initial MedOnc  Patient Visit Type Medonc  Treatment Phase Treatment planning  Barriers/Navigation Needs Education  Education Understanding Cancer/ Treatment Options;Newly Diagnosed Cancer Education  Interventions Referrals;Education Method  Referrals Nutrition/dietician  Education Method Verbal;Written;Teach-back  Support Groups/Services GI  Time Spent with Patient 37  Met with patient, daughter, sister and significant other during new patient visit. Explained the role of the GI Nurse Navigator and provided New Patient Packet with information on: 1.  Mucinous adenocarcinoma 2. Support groups 3. Advanced Directives 4. Fall Safety Plan Answered questions, reviewed current treatment plan using TEACH back and provided emotional support. Provided copy of current treatment plan. Provided verbal/printed information on PAC, PET scan, CEA marker, FOLFOX regimen and briefly reviewed main side effects. Made her aware that hair thinning is probable, but she will not lose all her hair on this regimen. Informed her to be proactive on her disability extension after surgical disability expires. Dietician referral requested for 1st chemo visit.  Merceda Elks, RN, BSN GI Oncology [place]

## 2015-01-29 NOTE — Telephone Encounter (Signed)
Gave and printed appt sched and avs fo rpt for Aug and Sept °

## 2015-01-29 NOTE — Progress Notes (Signed)
[facility] New Patient Consult   Referring MD: Katalia Choma 54 y.o.  [birth year]    Reason for Referral: Metastatic adenocarcinoma   HPI: Ms. Eakins reports first noting lower abdominal discomfort approximately 6 months ago. She had constipation. She saw Dr. Cristina Gong and underwent a colonoscopy in April (we do not have this report available today) and this revealed diverticulosis.  In July she palpated a mass in the left lower abdomen and saw Dr. Zadie Rhine. A CT of the abdomen and pelvis on 01/03/2015 revealed lung nodules concerning for metastatic disease. Multiple low-density lesions were noted in the liver consistent with metastatic disease. The spleen and pancreas appeared unremarkable. Moderate left pattern process was noted with dilatation of the proximal left ureter secondary to a mass in the left pelvis most consistent with an ovarian mass. Sigmoid diverticulosis was noted. The appendix appeared normal. Left periaortic adenopathy with the largest node measuring 17 x 15 mm consistent with metastatic disease.  She was referred to Dr. Aldean Ast. The CEA returned elevated at 30 and the CEA at 32.8.  She was taken to the operating room by Dr. Alycia Rossetti on 01/16/2015. She underwent a bilateral salpingo-oophorectomy, tumor debulking on the left, and placement of a left ureter stent. An 8 cm left adnexal mass was adherent to the left pelvic sidewall and rectosigmoid colon. Palpable intraparenchymal liver metastases were noted. No carcinomatosis. Small volume of ascites. There was fibrosis of the left pelvic sidewall and retroperitoneum and thickening of the rectosigmoid colon with no obstruction.  The pathology ([id number]) confirmed adenocarcinoma with mucinous features measuring 7 cm involving the left ovary. The left fallopian tube was involved by adenocarcinoma. The right ovary had a benign serous cystadenoma and no evidence of malignancy. The left ovarian tumor  appeared as a high-grade adenocarcinoma with a cystic component. Immunohistochemical stains were positive for CDX2, CEA, and cytokeratin 7. The tumor is negative for estrogen receptor, progesterone receptor,p504s, WT-1, and cytokeratin 20. The possibility of a primary ovarian adenocarcinoma was considered, but the CDX2 positivity suggested a gastrointestinal primary.  She saw Dr. Denman George in follow-up. She feels the tumor likely represents a GI primary. She was referred to Dr. Penelope Coop and underwent an upper endoscopy today. This confirmed erythema in the lower esophagus, a few small erosions in the gastric antrum, a thickened gastric fold at the greater curvature with a small ulceration. Biopsies were taken.    Past Medical History  Diagnosis Date  . Wears glasses   . Diverticulosis     dx 09/2014  . Esophagus hernia     hx of  . GERD (gastroesophageal reflux disease)     once in a while  . Anemia     hx of, none since hysterectomy  .  G3, P2, 1 abortion        .        Marland Kitchen Anxiety     recent, situational    Past Surgical History  Procedure Laterality Date  . Wisdom tooth extraction  ~2001  . Cholecystectomy  2007    lap choli  . Breast lumpectomy Right 10/2013  . Vaginal hysterectomy  2005  . Colonoscopy  2016  . Laparotomy N/A 01/16/2015    Procedure: EXPLORATORY LAPAROTOMY ;  Surgeon: Nancy Marus, MD;  Location: [facility];  Service: Gynecology;  Laterality: N/A;  . Oophorectomy Bilateral 01/16/2015    Procedure: BILATERAL OOPHORECTOMY RESECTION OF PELVIC MASS ;  Surgeon: Nancy Marus, MD;  Location: [facility];  Service: Gynecology;  Laterality: Bilateral;  . Cystoscopy w/ ureteral stent placement Left 01/16/2015    Procedure: CYSTOSCOPY WITH RETROGRADE PYELOGRAM/URETERAL STENT PLACEMENT;  Surgeon: Carolan Clines, MD;  Location: [facility];  Service: Urology;  Laterality: Left;    Medications: Reviewed  Allergies:  Allergies  Allergen Reactions  . Darvon [Propoxyphene]      Hallucinations   . Other Swelling    Adhesive Glue-Caused rash and burning.     Family history: Her father had pancreas cancer. A paternal aunt had breast cancer. A paternal first cousin had breast cancer. Her maternal grandfather had colon cancer. Her paternal grandmother had breast cancer. Her paternal cousin is BRCA2 positive  Social History:   She works in Geophysicist/field seismologist. She lives in [place]. She quit smoking cigarettes 28 years ago. She reports social alcohol use. She received red cell transfusions at the time of a hysterectomy procedure. No risk factor for HIV or hepatitis.  History  Alcohol Use  . Yes    Comment: glass nightly    History  Smoking status  . Former Smoker  . Types: Cigarettes  . Quit date: 10/07/1985  Smokeless tobacco  . Never Used      ROS:   Positives include: Anorexia, mild nausea, intermittent constipation and diarrhea, bleeding with firm bowel movements, difficulty going from a sitting to standing position since surgery, left leg discomfort and swelling beginning 01/27/2015  A complete ROS was otherwise negative.  Physical Exam:  Blood pressure 138/67, pulse 91, temperature 98.6 F (37 C), temperature source Oral, resp. rate 18, height $RemoveBe'5\' 5"'WVejhYPLm$  (1.651 m), weight 178 lb 14.4 oz (81.149 kg), SpO2 100 %.  HEENT: Oropharynx without visible mass, neck without mass Lungs: Clear bilaterally Cardiac: Regular rate and rhythm Abdomen: No hepatosplenomegaly, no apparent ascites, mild tenderness in the left abdomen, no mass . Healed midline incision. Vascular: Trace edema at the left foot and ankle, no upper or leg edema, erythema, or palpable cord noted Lymph nodes: No cervical, supraclavicular, axillary, or inguinal nodes Neurologic: Alert and oriented, the motor exam appears intact in the upper and lower extremities Skin: No rash Musculoskeletal: No spine tenderness   LAB:  CBC  Lab Results  Component Value Date   WBC 16.1* 01/17/2015   HGB  10.2* 01/17/2015   HCT 31.7* 01/17/2015   MCV 88.8 01/17/2015   PLT 405* 01/17/2015   NEUTROABS 7.6 01/11/2015     CMP      Component Value Date/Time   NA 137 01/17/2015 0536   K 4.3 01/17/2015 0536   CL 102 01/17/2015 0536   CO2 28 01/17/2015 0536   GLUCOSE 158* 01/17/2015 0536   BUN 12 01/17/2015 0536   CREATININE 0.92 01/17/2015 0536   CALCIUM 8.5* 01/17/2015 0536   PROT 7.4 01/11/2015 1100   ALBUMIN 3.9 01/11/2015 1100   AST 23 01/11/2015 1100   ALT 17 01/11/2015 1100   ALKPHOS 80 01/11/2015 1100   BILITOT 0.7 01/11/2015 1100   GFRNONAA >60 01/17/2015 0536   GFRAA >60 01/17/2015 0536    Lab Results  Component Value Date   CEA 30.0* 01/16/2015    Imaging:  As per history of present illness, CT from 01/03/2015 reviewed   Assessment/Plan:   1. 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 Metastatic left periaortic adenopathy.  2. Placement of a left ureter stent 01/16/2015  3.   Family history of multiple cancers, report of a paternal cousin-BRCA2 positive  4.   Upper endoscopy 01/29/2015 with a localized thick gastric fold with a small ulcer  5.   Mild swelling of the left foot and ankle 01/27/2015   Disposition:   Ms. Boettger has been diagnosed with metastatic mucinous adenocarcinoma. She underwent a bilateral salpingo-oophorectomy. The pattern of tumor spread is most consistent with a gastrointestinal (colorectal) primary. However she underwent a colonoscopy in April 2016 with no tumor found.  I discussed the differential diagnosis and treatment options with Ms. Villegas and her family. They understand she has metastatic disease. No  therapy will be curative. We will recommend systemic chemotherapy. She will most likely receive FOLFOX if a colon primary is confirmed or no primary tumor site is found.  Ms. Harms will be referred for placement of a Port-A-Cath and a staging PET scan this week. She will return for an office visit 02/07/2015.  I have a low clinical suspicion for a left lower extremity deep vein thrombosis. She will contact us for increased swelling, erythema, or pain.  Approximately 50 minutes were spent with the patient today. The majority of the time was used for counseling and coordination of care.   [place], [place] 01/29/2015, 2:27 PM

## 2015-01-29 NOTE — Progress Notes (Signed)
Checked in new pt for ca concern. Pt has my card for any financial assistance she may need. Pt made pymt of $50 copay via [employer]. Receipt given.

## 2015-01-30 ENCOUNTER — Encounter: Payer: Self-pay | Admitting: *Deleted

## 2015-01-30 NOTE — Progress Notes (Signed)
[place] Work  Holiday representative was referred by Designer, jewellery.  CSW met with patient and family in exam room to offer support and assess for needs.  Patient had previously been tearful and presented with emotional distress.  Patient stated she was doing "better" after meeting with the treatment team and getting information on her treatment plan.  CSW informed patient of the support team and support services at [facility].   CSW also informed patient of the GI support group and encouraged patient to call with any questions or concerns.      Johnnye Lana, MSW, LCSW, OSW-C Clinical Social Worker [facility] [phone]

## 2015-02-01 ENCOUNTER — Telehealth: Payer: Self-pay | Admitting: Nurse Practitioner

## 2015-02-01 NOTE — Telephone Encounter (Signed)
Patient calling to check status of FMLA papers. She requests papers reflect last day at work 7/28 as she did not go to work on 7/29 due to her apt with Dr. Fermin Schwab. Papers faxed with update.

## 2015-02-02 ENCOUNTER — Other Ambulatory Visit: Payer: Self-pay | Admitting: Radiology

## 2015-02-05 ENCOUNTER — Ambulatory Visit (HOSPITAL_COMMUNITY)
Admission: RE | Admit: 2015-02-05 | Discharge: 2015-02-05 | Disposition: A | Discharge status: Home / Self Care | Payer: BLUE CROSS/BLUE SHIELD | Source: Ambulatory Visit | Attending: Oncology | Admitting: Oncology

## 2015-02-05 ENCOUNTER — Encounter (HOSPITAL_COMMUNITY): Payer: Self-pay

## 2015-02-05 ENCOUNTER — Other Ambulatory Visit: Payer: Self-pay | Admitting: *Deleted

## 2015-02-05 ENCOUNTER — Other Ambulatory Visit: Payer: Self-pay | Admitting: Oncology

## 2015-02-05 DIAGNOSIS — D649 Anemia, unspecified: Secondary | ICD-10-CM | POA: Diagnosis not present

## 2015-02-05 DIAGNOSIS — C569 Malignant neoplasm of unspecified ovary: Secondary | ICD-10-CM | POA: Diagnosis present

## 2015-02-05 DIAGNOSIS — Z87891 Personal history of nicotine dependence: Secondary | ICD-10-CM | POA: Diagnosis not present

## 2015-02-05 DIAGNOSIS — K219 Gastro-esophageal reflux disease without esophagitis: Secondary | ICD-10-CM | POA: Diagnosis not present

## 2015-02-05 DIAGNOSIS — Z7902 Long term (current) use of antithrombotics/antiplatelets: Secondary | ICD-10-CM | POA: Diagnosis not present

## 2015-02-05 DIAGNOSIS — N135 Crossing vessel and stricture of ureter without hydronephrosis: Secondary | ICD-10-CM | POA: Insufficient documentation

## 2015-02-05 DIAGNOSIS — F419 Anxiety disorder, unspecified: Secondary | ICD-10-CM | POA: Insufficient documentation

## 2015-02-05 DIAGNOSIS — C801 Malignant (primary) neoplasm, unspecified: Secondary | ICD-10-CM

## 2015-02-05 LAB — CBC WITH DIFFERENTIAL/PLATELET
BASOS ABS: 0.1 10*3/uL (ref 0.0–0.1)
Basophils Relative: 1 % (ref 0–1)
Eosinophils Absolute: 1.1 10*3/uL — ABNORMAL HIGH (ref 0.0–0.7)
Eosinophils Relative: 10 % — ABNORMAL HIGH (ref 0–5)
HEMATOCRIT: 31.5 % — AB (ref 36.0–46.0)
Hemoglobin: 9.9 g/dL — ABNORMAL LOW (ref 12.0–15.0)
LYMPHS PCT: 24 % (ref 12–46)
Lymphs Abs: 2.6 10*3/uL (ref 0.7–4.0)
MCH: 28.6 pg (ref 26.0–34.0)
MCHC: 31.4 g/dL (ref 30.0–36.0)
MCV: 91 fL (ref 78.0–100.0)
Monocytes Absolute: 0.8 10*3/uL (ref 0.1–1.0)
Monocytes Relative: 7 % (ref 3–12)
NEUTROS ABS: 6.1 10*3/uL (ref 1.7–7.7)
Neutrophils Relative %: 58 % (ref 43–77)
PLATELETS: 521 10*3/uL — AB (ref 150–400)
RBC: 3.46 MIL/uL — ABNORMAL LOW (ref 3.87–5.11)
RDW: 13.5 % (ref 11.5–15.5)
WBC: 10.7 10*3/uL — ABNORMAL HIGH (ref 4.0–10.5)

## 2015-02-05 LAB — PROTIME-INR
INR: 0.97 (ref 0.00–1.49)
Prothrombin Time: 13.1 seconds (ref 11.6–15.2)

## 2015-02-05 MED ORDER — FENTANYL CITRATE (PF) 100 MCG/2ML IJ SOLN
INTRAMUSCULAR | Status: AC
  Filled 2015-02-05: qty 2

## 2015-02-05 MED ORDER — MIDAZOLAM HCL 2 MG/2ML IJ SOLN
INTRAMUSCULAR | Status: AC
  Filled 2015-02-05: qty 4

## 2015-02-05 MED ORDER — SODIUM CHLORIDE 0.9 % IV SOLN: INTRAVENOUS | Status: DC

## 2015-02-05 MED ORDER — FENTANYL CITRATE (PF) 100 MCG/2ML IJ SOLN
INTRAMUSCULAR | Status: AC | PRN
  Administered 2015-02-05: 50 ug via INTRAVENOUS
  Administered 2015-02-05: 25 ug via INTRAVENOUS

## 2015-02-05 MED ORDER — HEPARIN SOD (PORK) LOCK FLUSH 100 UNIT/ML IV SOLN
INTRAVENOUS | Status: AC
  Filled 2015-02-05: qty 5

## 2015-02-05 MED ORDER — CEFAZOLIN SODIUM-DEXTROSE 2-3 GM-% IV SOLR
2.0000 g | Freq: Once | INTRAVENOUS | Status: AC
  Administered 2015-02-05: 2 g via INTRAVENOUS

## 2015-02-05 MED ORDER — HEPARIN SOD (PORK) LOCK FLUSH 100 UNIT/ML IV SOLN
INTRAVENOUS | Status: AC | PRN
  Administered 2015-02-05: 500 [IU]

## 2015-02-05 MED ORDER — LIDOCAINE HCL 1 % IJ SOLN
INTRAMUSCULAR | Status: AC
  Filled 2015-02-05: qty 20

## 2015-02-05 MED ORDER — CEFAZOLIN SODIUM-DEXTROSE 2-3 GM-% IV SOLR
INTRAVENOUS | Status: AC
  Filled 2015-02-05: qty 50

## 2015-02-05 MED ORDER — MIDAZOLAM HCL 2 MG/2ML IJ SOLN
INTRAMUSCULAR | Status: AC | PRN
  Administered 2015-02-05: 1 mg via INTRAVENOUS
  Administered 2015-02-05 (×3): 0.5 mg via INTRAVENOUS

## 2015-02-05 NOTE — Procedures (Signed)
Interventional Radiology Procedure Note  Procedure: Placement of a right IJ approach single lumen PowerPort.  Tip is positioned at the superior cavoatrial junction and catheter is ready for immediate use.  Complications: No immediate Recommendations:  - Do not submerge for 7 days - Routine line care   Brianna Vasquez, M.D Pager:  [phone]

## 2015-02-05 NOTE — Progress Notes (Signed)
MD has ordered genetics referral due to a report in chart of cousin with BRCA-2 +. POF sent.

## 2015-02-05 NOTE — H&P (Signed)
Chief Complaint: Patient was seen in consultation today for port a cath placement   Referring Physician(s): Sherrill,Gary B  History of Present Illness: Brianna Vasquez is a 54 y.o. female with history of recently diagnosed metastatic mucinous adenocarcinoma who presents today for port a cath placement for chemotherapy.   Past Medical History  Diagnosis Date  . Wears glasses   . Diverticulosis     dx 09/2014  . Esophagus hernia     hx of  . GERD (gastroesophageal reflux disease)     once in a while  . Anemia     hx of, none since hysterectomy  . Headache     severe, for last few mornings  . Cancer     "ovary tumor-on colon, kidney, spots on liver and lungs"  . Anxiety     recent, situational    Past Surgical History  Procedure Laterality Date  . Wisdom tooth extraction  ~2001  . Cholecystectomy  2007    lap choli  . Breast lumpectomy Right 10/2013  . Vaginal hysterectomy  2005  . Colonoscopy  2016  . Laparotomy N/A 01/16/2015    Procedure: EXPLORATORY LAPAROTOMY ;  Surgeon: Nancy Marus, MD;  Location: [facility];  Service: Gynecology;  Laterality: N/A;  . Oophorectomy Bilateral 01/16/2015    Procedure: BILATERAL OOPHORECTOMY RESECTION OF PELVIC MASS ;  Surgeon: Nancy Marus, MD;  Location: [facility];  Service: Gynecology;  Laterality: Bilateral;  . Cystoscopy w/ ureteral stent placement Left 01/16/2015    Procedure: CYSTOSCOPY WITH RETROGRADE PYELOGRAM/URETERAL STENT PLACEMENT;  Surgeon: Carolan Clines, MD;  Location: [facility];  Service: Urology;  Laterality: Left;    Allergies: Darvon and Other  Medications: Prior to Admission medications   Medication Sig Start Date End Date Taking? Authorizing Provider  ALPRAZolam Duanne Moron) 0.5 MG tablet Take 1 tablet (0.5 mg total) by mouth every 8 (eight) hours as needed for anxiety. 01/29/15  Yes Ladell Pier, MD  calcium carbonate (TUMS - DOSED IN MG ELEMENTAL CALCIUM) 500 MG chewable tablet Chew 1-2 tablets by mouth 3 (three)  times daily as needed for indigestion or heartburn.   Yes Historical Provider, MD  enoxaparin (LOVENOX) 40 MG/0.4ML injection Inject 0.4 mLs (40 mg total) into the skin daily. 01/19/15  Yes Melissa D Cross, NP  gabapentin (NEURONTIN) 300 MG capsule Take 2 capsules (600 mg total) by mouth 3 (three) times daily. 01/19/15  Yes Melissa D Cross, NP  naproxen sodium (ANAPROX) 220 MG tablet Take 220 mg by mouth 2 (two) times daily with a meal.   Yes Historical Provider, MD  oxyCODONE (OXY IR/ROXICODONE) 5 MG immediate release tablet Take 1-2 tablets (5-10 mg total) by mouth every 4 (four) hours as needed for severe pain. Patient taking differently: Take 5-15 mg by mouth every 4 (four) hours as needed for severe pain.  01/29/15  Yes Ladell Pier, MD  b complex vitamins tablet Take 1 tablet by mouth daily.    Historical Provider, MD  Ginger, Zingiber officinalis, (GINGER ROOT PO) Take 1 capsule by mouth daily.    Historical Provider, MD  Nyoka Cowden Tea, Camillia sinensis, (GREEN TEA EXTRACT PO) Take 1 capsule by mouth daily.    Historical Provider, MD     Family History  Problem Relation Age of Onset  . Cancer Father 73    pancreatic  . Cancer Paternal Aunt 39    breast   . Cancer Paternal Grandmother 32    breast/mastectomy  . Colon cancer  Maternal Grandfather   . Alzheimer's disease Paternal Grandfather     Social History   Social History  . Marital Status: Single    Spouse Name: N/A  . Number of Children: N/A  . Years of Education: N/A   Social History Main Topics  . Smoking status: Former Smoker    Types: Cigarettes    Quit date: 10/07/1985  . Smokeless tobacco: Never Used  . Alcohol Use: Yes     Comment: glass nightly  . Drug Use: No  . Sexual Activity: Not Asked   Other Topics Concern  . 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  Respiratory: Negative for cough and shortness of breath.   Cardiovascular: Negative for chest pain.  Gastrointestinal: Positive for abdominal pain. Negative for nausea, vomiting and blood in stool.  Genitourinary: Negative for dysuria and hematuria.  Musculoskeletal: Negative for back pain.  Neurological: Negative for headaches.    Vital Signs: BP 136/70 mmHg  Pulse 73  Temp(Src) 98.3 F (36.8 C) (Oral)  Resp 16  SpO2 100%  Physical Exam  Constitutional: She is oriented to person, place, and time. She appears well-developed and well-nourished.  Cardiovascular: Normal rate and regular rhythm.   Pulmonary/Chest: Effort normal and breath sounds normal.  Abdominal: Soft. Bowel sounds are normal. There is tenderness.  Musculoskeletal: Normal range of motion. She exhibits no edema.  Neurological: She is alert and oriented to person, place, and time.    Mallampati Score:     Imaging: Dg Chest 2 View  01/11/2015   CLINICAL DATA:  Surgery.  EXAM: CHEST  2 VIEW  COMPARISON:  01/17/2011.  FINDINGS: The heart size and mediastinal contours are within normal limits. Both lungs are clear. The visualized skeletal structures are unremarkable.  IMPRESSION: No active cardiopulmonary disease.   Electronically Signed   By: Marcello Moores  Register   On: 01/11/2015 13:51   Dg Retrograde Pyelogram  01/16/2015   CLINICAL DATA:  54 year old female undergoing left retrograde ureteropyelogram with stent placement  EXAM: RETROGRADE PYELOGRAM  COMPARISON:  Prior CT abdomen/ pelvis 01/03/2015  FINDINGS: A total of 7 intraoperative spot radiographs demonstrate cannulation of the left ureteral orifice and retrograde ureterogram. There is complete occlusion of the distal ureter. The obstruction is then crossed with a wire. Contrast injection demonstrates moderate hydronephrosis. The final images demonstrate placement of a double-J ureteral stent. The proximal loop  is partially reconstituted and overlies the superior infundibulum. The distal loop is partially reconstituted and overlies the expected location of the urinary bladder.  IMPRESSION: 1. Complete obstruction of the distal left ureter with resultant proximal hydroureteronephrosis. 2. Placement of a double-J ureteral stent. The proximal loop is partially reconstituted in overlies the upper pole infundibulum.   Electronically Signed   By: Jacqulynn Cadet M.D.   On: 01/16/2015 16:08    Labs:  CBC:  Recent Labs  01/11/15 1100 01/17/15 0536 02/05/15 1215  WBC 11.3* 16.1* 10.7*  HGB 12.4 10.2* 9.9*  HCT 38.8 31.7* 31.5*  PLT 382 405* 521*    COAGS:  Recent Labs  02/05/15 1215  INR 0.97    BMP:  Recent Labs  01/11/15 1100 01/17/15 0536  NA 139 137  K 4.6 4.3  CL 103 102  CO2 29  28  GLUCOSE 97 158*  BUN 12 12  CALCIUM 9.0 8.5*  CREATININE 1.02* 0.92  GFRNONAA >60 >60  GFRAA >60 >60    LIVER FUNCTION TESTS:  Recent Labs  01/11/15 1100  BILITOT 0.7  AST 23  ALT 17  ALKPHOS 80  PROT 7.4  ALBUMIN 3.9    TUMOR MARKERS:  Recent Labs  01/16/15 1155  CEA 30.0*    Assessment and Plan: Brianna Vasquez is a 54 y.o. female with history of recently diagnosed metastatic mucinous adenocarcinoma who presents today for port a cath placement for chemotherapy. Risks and benefits discussed with the patient/husband including, but not limited to bleeding, infection, pneumothorax, or fibrin sheath development and need for additional procedures.All of the patient's questions were answered, patient is agreeable to proceed.Consent signed and in chart.     Thank you for this interesting consult.  I greatly enjoyed meeting Brianna Vasquez and look forward to participating in their care.  A copy of this report was sent to the requesting provider on this date.  Signed: D. Rowe Robert 02/05/2015, 2:24 PM   I spent a total of 15 minutes in face to face in clinical consultation,  greater than 50% of which was counseling/coordinating care for port a cath placement

## 2015-02-05 NOTE — Discharge Instructions (Signed)

## 2015-02-06 ENCOUNTER — Encounter (HOSPITAL_COMMUNITY)
Admission: RE | Admit: 2015-02-06 | Discharge: 2015-02-06 | Disposition: A | Discharge status: Home / Self Care | Payer: BLUE CROSS/BLUE SHIELD | Source: Ambulatory Visit | Attending: Oncology | Admitting: Oncology

## 2015-02-06 ENCOUNTER — Telehealth: Payer: Self-pay | Admitting: Oncology

## 2015-02-06 DIAGNOSIS — C801 Malignant (primary) neoplasm, unspecified: Secondary | ICD-10-CM | POA: Diagnosis present

## 2015-02-06 MED ORDER — FLUDEOXYGLUCOSE F - 18 (FDG) INJECTION
8.7600 | Freq: Once | INTRAVENOUS | Status: DC | PRN
  Administered 2015-02-06: 8.76 via INTRAVENOUS
  Filled 2015-02-06: qty 8.76

## 2015-02-06 NOTE — Telephone Encounter (Signed)
Spoke with patient and she is aware of her genetics appointment °

## 2015-02-07 ENCOUNTER — Ambulatory Visit (HOSPITAL_BASED_OUTPATIENT_CLINIC_OR_DEPARTMENT_OTHER): Payer: BLUE CROSS/BLUE SHIELD | Admitting: Oncology

## 2015-02-07 ENCOUNTER — Other Ambulatory Visit: Payer: BLUE CROSS/BLUE SHIELD

## 2015-02-07 ENCOUNTER — Encounter: Payer: Self-pay | Admitting: *Deleted

## 2015-02-07 ENCOUNTER — Other Ambulatory Visit: Payer: Self-pay | Admitting: *Deleted

## 2015-02-07 ENCOUNTER — Ambulatory Visit (HOSPITAL_BASED_OUTPATIENT_CLINIC_OR_DEPARTMENT_OTHER): Payer: BLUE CROSS/BLUE SHIELD

## 2015-02-07 ENCOUNTER — Telehealth: Payer: Self-pay | Admitting: Oncology

## 2015-02-07 VITALS — BP 168/86 | HR 93 | Temp 98.3°F | Resp 20 | Ht 65.0 in | Wt 177.9 lb

## 2015-02-07 DIAGNOSIS — M7989 Other specified soft tissue disorders: Secondary | ICD-10-CM

## 2015-02-07 DIAGNOSIS — G893 Neoplasm related pain (acute) (chronic): Secondary | ICD-10-CM

## 2015-02-07 DIAGNOSIS — R3915 Urgency of urination: Secondary | ICD-10-CM

## 2015-02-07 DIAGNOSIS — Z809 Family history of malignant neoplasm, unspecified: Secondary | ICD-10-CM

## 2015-02-07 DIAGNOSIS — C5702 Malignant neoplasm of left fallopian tube: Secondary | ICD-10-CM | POA: Diagnosis not present

## 2015-02-07 DIAGNOSIS — C7800 Secondary malignant neoplasm of unspecified lung: Secondary | ICD-10-CM

## 2015-02-07 DIAGNOSIS — C801 Malignant (primary) neoplasm, unspecified: Secondary | ICD-10-CM

## 2015-02-07 DIAGNOSIS — R300 Dysuria: Secondary | ICD-10-CM

## 2015-02-07 DIAGNOSIS — C562 Malignant neoplasm of left ovary: Secondary | ICD-10-CM | POA: Insufficient documentation

## 2015-02-07 DIAGNOSIS — C787 Secondary malignant neoplasm of liver and intrahepatic bile duct: Secondary | ICD-10-CM | POA: Diagnosis not present

## 2015-02-07 DIAGNOSIS — C7951 Secondary malignant neoplasm of bone: Secondary | ICD-10-CM

## 2015-02-07 DIAGNOSIS — G8918 Other acute postprocedural pain: Secondary | ICD-10-CM

## 2015-02-07 DIAGNOSIS — R8299 Other abnormal findings in urine: Secondary | ICD-10-CM

## 2015-02-07 LAB — URINALYSIS, MICROSCOPIC - CHCC
Bilirubin (Urine): NEGATIVE
GLUCOSE UR CHCC: NEGATIVE mg/dL
Ketones: NEGATIVE mg/dL
NITRITE: NEGATIVE
PH: 6 (ref 4.6–8.0)
PROTEIN: 100 mg/dL
Specific Gravity, Urine: 1.015 (ref 1.003–1.035)
Urobilinogen, UR: 0.2 mg/dL (ref 0.2–1)

## 2015-02-07 LAB — GLUCOSE, CAPILLARY: GLUCOSE-CAPILLARY: 89 mg/dL (ref 65–99)

## 2015-02-07 MED ORDER — PROCHLORPERAZINE MALEATE 10 MG PO TABS: 10.0000 mg | ORAL_TABLET | Freq: Four times a day (QID) | ORAL | Status: DC | PRN

## 2015-02-07 MED ORDER — LIDOCAINE-PRILOCAINE 2.5-2.5 % EX CREA: TOPICAL_CREAM | CUTANEOUS | Status: AC

## 2015-02-07 MED ORDER — CIPROFLOXACIN HCL 500 MG PO TABS: 500.0000 mg | ORAL_TABLET | Freq: Two times a day (BID) | ORAL | Status: DC

## 2015-02-07 MED ORDER — OXYCODONE HCL 5 MG PO TABS: 5.0000 mg | ORAL_TABLET | ORAL | Status: DC | PRN

## 2015-02-07 NOTE — Telephone Encounter (Signed)
Gave adn printed appt sched and avs for pt for Sept and OCT °

## 2015-02-07 NOTE — Progress Notes (Signed)
Pt given instructions to begin Cipro 500 mg twice daily for 5 days, per Dr. Benay Spice. Rx sent electronically. Urinalysis is positive for UTI.

## 2015-02-07 NOTE — Progress Notes (Signed)
  [place] OFFICE PROGRESS NOTE   Diagnosis: Ovarian cancer  INTERVAL HISTORY:   Brianna Vasquez returns as scheduled. She continues to have pain in the left lower abdomen and left upper leg. Gabapentin helps the leg pain. She also takes naproxen and oxycodone. She complains of urinary urgency and "cloudy "urine.  She underwent placement of a Port-A-Cath on 02/05/2015.  Objective:  Vital signs in last 24 hours:  Blood pressure 168/86, pulse 93, temperature 98.3 F (36.8 C), temperature source Oral, resp. rate 20, height $RemoveBe'5\' 5"'NSPUitemz$  (1.651 m), weight 177 lb 14.4 oz (80.695 kg), SpO2 99 %.  Resp: Lungs clear bilaterally, slight decrease in breath sounds at the right chest Cardio: Regular rate and rhythm GI: No hepatosplenomegaly, nontender, no mass Vascular: No leg edema Musculoskeletal: Pain with external rotation at the left hip   Portacath/PICC-without erythema  Lab Results:  Lab Results  Component Value Date   WBC 10.7* 02/05/2015   HGB 9.9* 02/05/2015   HCT 31.5* 02/05/2015   MCV 91.0 02/05/2015   PLT 521* 02/05/2015   NEUTROABS 6.1 02/05/2015      Lab Results  Component Value Date   CEA 30.0* 01/16/2015    Imaging: 02/06/2015 PET scan reviewed with Brianna Vasquez and her family  Medications: I have reviewed the patient's current medications.  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 Metastatic left periaortic adenopathy.  PET scan 832 2016 with extensive hypermetabolic lymphadenopathy in the chest, abdomen, and pelvis, Residual left pelvic tumor, sigmoid colon lesion, liver/lung  metastases, and a left acetabulum metastasis  2. Placement of a left ureter stent 01/16/2015  3. Family history of multiple cancers, report of a paternal cousin-BRCA2 positive  4. Upper endoscopy 01/29/2015 with a localized thick gastric fold with a small ulcer  5. Mild swelling of the left foot and ankle 01/27/2015  6.   Pain secondary to the left pelvic tumor and acetabulum metastasis    Disposition:  Brianna Vasquez appears stable. Her case was presented at the GI tumor conference earlier today. I discussed the case with Dr. Alycia Rossetti. The clinical presentation and surgical pathology are most consistent with a mucinous ovarian primary. However the differential diagnosis includes a gastrointestinal primary. There is a hypermetabolic area at the sigmoid colon on the staging PET scan. I discussed the case with Dr. Cristina Gong and he will schedule Brianna Vasquez for a sigmoidoscopy this week.  I recommend FOLFOX chemotherapy. I reviewed the potential toxicities associated with the FOLFOX regimen including the chance for nausea/vomiting, mucositis, diarrhea, alopecia, and hematologic toxicity. We discussed the sun sensitivity, hyperpigmentation, and hand/foot syndrome associated with 5-fluorouracil. We discussed the various types of neuropathy seen with oxaliplatin. She agrees to proceed. She attended a chemotherapy teaching class earlier today.  She will continue naproxen, gabapentin, and oxycodone for pain. We will check a urinalysis today.  She is scheduled for a first cycle of FOLFOX on 02/14/2015. Brianna Vasquez will return for an office visit and cycle 2 chemotherapy 02/28/2015.  She will be referred for palliative radiation if the left groin/leg pain does not improve with chemotherapy.  Betsy Coder, MD  02/07/2015  3:35 PM

## 2015-02-08 ENCOUNTER — Encounter: Payer: BLUE CROSS/BLUE SHIELD | Admitting: Genetic Counselor

## 2015-02-08 ENCOUNTER — Encounter (HOSPITAL_COMMUNITY): Payer: Self-pay | Admitting: *Deleted

## 2015-02-08 LAB — URINE CULTURE

## 2015-02-09 ENCOUNTER — Ambulatory Visit (HOSPITAL_COMMUNITY): Payer: BLUE CROSS/BLUE SHIELD | Admitting: Anesthesiology

## 2015-02-09 ENCOUNTER — Ambulatory Visit (HOSPITAL_COMMUNITY)
Admission: RE | Admit: 2015-02-09 | Discharge: 2015-02-09 | Disposition: A | Discharge status: Home / Self Care | Payer: BLUE CROSS/BLUE SHIELD | Source: Ambulatory Visit | Attending: Gastroenterology | Admitting: Gastroenterology

## 2015-02-09 ENCOUNTER — Encounter (HOSPITAL_COMMUNITY)
Admission: RE | Disposition: A | Discharge status: Home / Self Care | Payer: Self-pay | Source: Ambulatory Visit | Attending: Gastroenterology

## 2015-02-09 ENCOUNTER — Encounter (HOSPITAL_COMMUNITY): Payer: Self-pay

## 2015-02-09 DIAGNOSIS — F419 Anxiety disorder, unspecified: Secondary | ICD-10-CM | POA: Diagnosis not present

## 2015-02-09 DIAGNOSIS — Z800 Family history of malignant neoplasm of digestive organs: Secondary | ICD-10-CM | POA: Diagnosis not present

## 2015-02-09 DIAGNOSIS — K5730 Diverticulosis of large intestine without perforation or abscess without bleeding: Secondary | ICD-10-CM | POA: Insufficient documentation

## 2015-02-09 DIAGNOSIS — Z1211 Encounter for screening for malignant neoplasm of colon: Secondary | ICD-10-CM | POA: Diagnosis present

## 2015-02-09 DIAGNOSIS — C787 Secondary malignant neoplasm of liver and intrahepatic bile duct: Secondary | ICD-10-CM | POA: Insufficient documentation

## 2015-02-09 DIAGNOSIS — C187 Malignant neoplasm of sigmoid colon: Secondary | ICD-10-CM | POA: Insufficient documentation

## 2015-02-09 DIAGNOSIS — Z87891 Personal history of nicotine dependence: Secondary | ICD-10-CM | POA: Insufficient documentation

## 2015-02-09 HISTORY — PX: COLONOSCOPY WITH PROPOFOL: SHX5780

## 2015-02-09 SURGERY — COLONOSCOPY WITH PROPOFOL
Anesthesia: Monitor Anesthesia Care

## 2015-02-09 MED ORDER — PROPOFOL INFUSION 10 MG/ML OPTIME
INTRAVENOUS | Status: DC | PRN
  Administered 2015-02-09: 250 ug/kg/min via INTRAVENOUS

## 2015-02-09 MED ORDER — SODIUM CHLORIDE 0.9 % IV SOLN: INTRAVENOUS | Status: DC

## 2015-02-09 MED ORDER — LACTATED RINGERS IV SOLN
INTRAVENOUS | Status: DC
  Administered 2015-02-09: 1000 mL via INTRAVENOUS

## 2015-02-09 NOTE — Anesthesia Postprocedure Evaluation (Signed)
  Anesthesia Post-op Note  Patient: Brianna Vasquez  Procedure(s) Performed: Procedure(s): COLONOSCOPY WITH PROPOFOL (N/A)  Patient Location: PACU  Anesthesia Type:MAC  Level of Consciousness: awake  Airway and Oxygen Therapy: Patient Spontanous Breathing  Post-op Pain: mild  Post-op Assessment: Post-op Vital signs reviewed              Post-op Vital Signs: Reviewed  Last Vitals:  Filed Vitals:   02/09/15 1555  BP: 143/76  Pulse: 88  Temp:   Resp: 17    Complications: No apparent anesthesia complications

## 2015-02-09 NOTE — Progress Notes (Signed)
Pediatric enterscope selected as couldn't advance the ultra slim past 30-35 cm

## 2015-02-09 NOTE — H&P (Signed)
Brianna Vasquez is an 54 y.o. female.   Chief Complaint: Intra-abd malignancy HPI: Pt had neg colonoscopy by me April 2016, altho there was severe fixation of the sigmoid region and moderately severe post-procedural pain.  Several months later, the patient was found to have intra-abdominal malignancy, with a mass arising from the left ovary with histologic characteristics (plus liver mets, no ascites) suggesting possible colonic origin.  Her PET scan is positive in the region of the distal sigmoid colon.  Repeat colonoscopy has been requested by the patient's oncologist to help confirm the absence or presence of colonic malignancy, to guide treatment.  Past Medical History  Diagnosis Date  . Wears glasses   . Diverticulosis     dx 09/2014  . Esophagus hernia     hx of  . GERD (gastroesophageal reflux disease)     once in a while  . Anemia     hx of, none since hysterectomy  . Headache     severe, for last few mornings  . Cancer     "ovary tumor-on colon, kidney, spots on liver and lungs"  . Anxiety     recent, situational    Past Surgical History  Procedure Laterality Date  . Wisdom tooth extraction  ~2001  . Cholecystectomy  2007    lap choli  . Breast lumpectomy Right 10/2013  . Vaginal hysterectomy  2005  . Colonoscopy  2016  . Laparotomy N/A 01/16/2015    Procedure: EXPLORATORY LAPAROTOMY ;  Surgeon: Nancy Marus, MD;  Location: [facility];  Service: Gynecology;  Laterality: N/A;  . Oophorectomy Bilateral 01/16/2015    Procedure: BILATERAL OOPHORECTOMY RESECTION OF PELVIC MASS ;  Surgeon: Nancy Marus, MD;  Location: [facility];  Service: Gynecology;  Laterality: Bilateral;  . Cystoscopy w/ ureteral stent placement Left 01/16/2015    Procedure: CYSTOSCOPY WITH RETROGRADE PYELOGRAM/URETERAL STENT PLACEMENT;  Surgeon: Carolan Clines, MD;  Location: [facility];  Service: Urology;  Laterality: Left;  . Porta cath insertion      Family History  Problem Relation Age of Onset  . Cancer  Father 69    pancreatic  . Cancer Paternal Aunt 54    breast   . Cancer Paternal Grandmother 14    breast/mastectomy  . Colon cancer Maternal Grandfather   . Alzheimer's disease Paternal Grandfather    Social History:  reports that she quit smoking about 29 years ago. Her smoking use included Cigarettes. She has never used smokeless tobacco. She reports that she drinks about 4.2 oz of alcohol per week. She reports that she does not use illicit drugs.  Allergies:  Allergies  Allergen Reactions  . Other Swelling and Other (See Comments)    Adhesive Glue-Caused rash and burning.   . Darvon [Propoxyphene] Other (See Comments)    Hallucinations     Medications Prior to Admission  Medication Sig Dispense Refill  . ALPRAZolam (XANAX) 0.5 MG tablet Take 1 tablet (0.5 mg total) by mouth every 8 (eight) hours as needed for anxiety. 60 tablet 0  . calcium carbonate (TUMS - DOSED IN MG ELEMENTAL CALCIUM) 500 MG chewable tablet Chew 1-2 tablets by mouth 3 (three) times daily as needed for indigestion or heartburn.    . ciprofloxacin (CIPRO) 500 MG tablet Take 1 tablet (500 mg total) by mouth 2 (two) times daily. (Patient taking differently: Take 500 mg by mouth 2 (two) times daily. For 5 days) 10 tablet 0  . enoxaparin (LOVENOX) 40 MG/0.4ML injection Inject 0.4 mLs (40 mg  total) into the skin daily. 25 Syringe 0  . gabapentin (NEURONTIN) 300 MG capsule Take 2 capsules (600 mg total) by mouth 3 (three) times daily. 90 capsule 3  . naproxen sodium (ANAPROX) 220 MG tablet Take 440 mg by mouth 3 (three) times daily.     Marland Kitchen oxyCODONE (OXY IR/ROXICODONE) 5 MG immediate release tablet Take 1-3 tablets (5-15 mg total) by mouth every 4 (four) hours as needed for severe pain. 75 tablet 0  . sucralfate (CARAFATE) 1 G tablet Take 1 g by mouth 3 (three) times daily before meals.  1  . lidocaine-prilocaine (EMLA) cream Apply to port site one hour prior to use. Do not rub in. Cover with plastic. 30 g 0  .  prochlorperazine (COMPAZINE) 10 MG tablet Take 1 tablet (10 mg total) by mouth every 6 (six) hours as needed for nausea or vomiting. 30 tablet 1    Results for orders placed or performed in visit on 02/07/15 (from the past 48 hour(s))  Urinalysis with microscopic - [facility]     Status: None   Collection Time: 02/07/15  3:38 PM  Result Value Ref Range   Glucose Negative Negative mg/dL   Bilirubin (Urine) Negative Negative   Ketones Negative Negative mg/dL   Specific Gravity, Urine 1.015 1.003 - 1.035   Blood Large Negative   pH 6.0 4.6 - 8.0   Protein 100 Negative- <30 mg/dL   Urobilinogen, UR 0.2 0.2 - 1 mg/dL   Nitrite Negative Negative   Leukocyte Esterase Moderate Negative   RBC / HPF TNTC 0 - 2   WBC, UA TNTC 0 - 2   Bacteria, UA Moderate Negative- Trace   Epithelial Cells Few Negative- Few  Urine Culture     Status: None   Collection Time: 02/07/15  3:38 PM  Result Value Ref Range   Urine Culture, Routine Culture, Urine     Comment: Final - ===== COLONY COUNT: ===== NO GROWTH NO GROWTH    No results found.  ROS  Blood pressure 150/52, temperature 98 F (36.7 C), temperature source Oral, resp. rate 16, height 5\' 5"  (1.651 m), weight 80.287 kg (177 lb), SpO2 91 %. Physical Exam Appears healthy.  VS unremarkable. Chest clear, heart without murmur or arrhythmia.  Abd soft, nontender, no obvious mass.  Assessment/Plan Possible colonic malignancy which might have been missed on recent colonoscopy due to fixation of the colon in that area, making it difficult to evaluate.  Therefore, will repeat at least a partial colonoscopy today, using an ultra-slim scope, to recheck the area of the distal colon.  Blodgett V 02/09/2015, 2:43 PM

## 2015-02-09 NOTE — Discharge Instructions (Addendum)
Colonoscopy, Care After These instructions give you information on caring for yourself after your procedure. Your doctor may also give you more specific instructions. Call your doctor if you have any problems or questions after your procedure. HOME CARE  Do not drive for 24 hours.  Do not sign important papers or use machinery for 24 hours.  You may shower.  You may go back to your usual activities, but go slower for the first 24 hours.  Take rest breaks often during the first 24 hours.  Walk around or use warm packs on your belly (abdomen) if you have belly cramping or gas.  Drink enough fluids to keep your pee (urine) clear or pale yellow.  Resume your normal diet. Avoid heavy or fried foods.  Avoid drinking alcohol for 24 hours or as told by your doctor.  Only take medicines as told by your doctor. If a tissue sample (biopsy) was taken during the procedure:   Do not take aspirin or blood thinners for 7 days, or as told by your doctor.  Do not drink alcohol for 7 days, or as told by your doctor.  Eat soft foods for the first 24 hours. GET HELP IF: You still have a small amount of blood in your poop (stool) 2-3 days after the procedure. GET HELP RIGHT AWAY IF:  You have more than a small amount of blood in your poop.  You see clumps of tissue (blood clots) in your poop.  Your belly is puffy (swollen).  You feel sick to your stomach (nauseous) or throw up (vomit).  You have a fever.  You have belly pain that gets worse and medicine does not help. MAKE SURE YOU:  Understand these instructions.  Will watch your condition.  Will get help right away if you are not doing well or get worse. Document Released: 06/28/2010 Document Revised: 05/31/2013 Document Reviewed: 01/31/2013 [facility] Patient Information 2015 [place], [place]. This information is not intended to replace advice given to you by your health care provider. Make sure you discuss any questions you have with  your health care provider.  Restart Lovenox.  Use Miralax (over the counter); adjust to effect, can take up to 5 days to kick in, works by being the proper strength. I.E. Mix 8 oz glass according to instructions on package, drink more or less as needed. Don't just increase the concentration. Right now colon is cleaned out. Okay to take Mirilax during chemo and adjust up or down as needed.  Not necessary to limit seeds or nuts in diet. Research doesn't show that seeds irritate diverticuli.

## 2015-02-09 NOTE — Anesthesia Preprocedure Evaluation (Signed)
Anesthesia Evaluation  Patient identified by MRN, date of birth, ID band Patient awake    Reviewed: Allergy & Precautions, NPO status , Patient's Chart, lab work & pertinent test results  Airway Mallampati: II  TM Distance: >3 FB Neck ROM: Full    Dental   Pulmonary former smoker,  breath sounds clear to auscultation        Cardiovascular negative cardio ROS  Rhythm:Regular Rate:Normal     Neuro/Psych    GI/Hepatic Neg liver ROS, hiatal hernia, GERD-  ,  Endo/Other  negative endocrine ROS  Renal/GU negative Renal ROS     Musculoskeletal   Abdominal   Peds  Hematology   Anesthesia Other Findings   Reproductive/Obstetrics                             Anesthesia Physical Anesthesia Plan  ASA: II  Anesthesia Plan: MAC   Post-op Pain Management:    Induction: Intravenous  Airway Management Planned: Nasal Cannula  Additional Equipment:   Intra-op Plan:   Post-operative Plan:   Informed Consent:   Plan Discussed with: CRNA and Anesthesiologist  Anesthesia Plan Comments:         Anesthesia Quick Evaluation

## 2015-02-09 NOTE — Op Note (Signed)
[facility] [place], [id number]   COLONOSCOPY PROCEDURE REPORT  PATIENT: Brianna Vasquez, Brianna Vasquez  MR#: [id number] BIRTHDATE: June 10, 1960 , 10  yrs. old GENDER: female ENDOSCOPIST: Ronald Lobo, MD REFERRED BY:  Milagros Evener, MD; Ma Rings, MD; Kandace Parkins, MD PROCEDURE DATE:  02/09/2015 PROCEDURE:   colonoscopy with biopsy ASA CLASS:   III INDICATIONS:  evaluate for colon cancer. This is a 54 year old female with a negative screening colonoscopy last April (4 months ago), at which time it was noted that there was a lot of sigmoid diverticulosis and severe fixation. Subsequently, some months later, the patient noted a left lower quadrant mass and was ultimately found to have intra-abdominal malignancy,seemingly arising from the left ovary, with hepatic metastases.    the histologic character of the mass, as well as the presence of hepatic metastases in the absence of ascites, raise the question as to whether this could be colon cancer that might have been missed on her colonoscopy just a few months earlier.her PET scan, performed recently, lites up in the region of the distal sigmoid colon. MEDICATIONS: propofol, per anesthesia  DESCRIPTION OF PROCEDURE:   After the risks and benefits and of the procedure were explained, informed consent was obtained.  this procedure was done as an outpatient at the [facility] cone endoscopy unit. The patient had been off her Lovenox for approximately 2 days at the time of this procedure.  There was no anal stenosis on digital exam.  The Pentax Ultra Slim [id number]  endoscope was introduced through the anus and advanced to the sigmoid region at about 30 cm or so, where upon further advancement was not possible because of angulation, fixation, and stricturing of the colonic lumen. There was no evident intraluminal lesion at that point. I elected to switch to the Pentax pediatric upper endoscope,  which has a smaller diameter and is more flexible. The scope was able to negotiate that area with moderate difficulty, and after getting through that area of stricturing, located at about 30 cm on the external anal opening in the sigmoid region, the scope advance quite readily to its full level of insertion, approximately 80 cm (beyond that, it simply looped), at which time it appeared that I was in the transverse colon area, although attempts to transilluminate the abdominal wall at that point to confirm location were unsuccessful. Nonetheless, there was a typical appearance of the splenic flexure region at the appropriate location of 50 cm during pullback, so I'm very confident that I reached the transverse colon and certainly was well beyond the area of radiographic abnormality noted on her recent PET scan.     .  The quality of the prep was excellent      .  The instrument was then slowly withdrawn as the colon was fully examined. Estimated blood loss is zero unless otherwise noted in this procedure report.   I feel that I was able to see all areas adequately during this exam, up to the limit of the exam which is felt to be the mid transverse colon.  Specifically, there was no evidence of any intraluminal masses to suggest a primary colon cancer.  As noted above, there was a severely fixated, angulated, strictured, and anatomically deformed area that extended several centimeters at the level of about 30 cm from the anus. At this location, there was a small patch of eroded tissue with some friability and slight mucosal hemorrhage, and some exudate. Biopsies were obtained of  this area at the conclusion of the procedure.  There was also quite a bit of diverticular disease throughout the sigmoid colon, descending colon, and distal transverse colon.  No polyps or intraluminal masses were seen, no diffuse colitis or vascular ectasia were noted.  Retroflexion in the rectum was  normal.          The scope was then withdrawn from the patient and the procedure completed.  WITHDRAWAL TIME: not relevant, since this was not a screening exam and was only a partial colonoscopy.  COMPLICATIONS: There were no immediate complications.  ENDOSCOPIC IMPRESSION: 1. No evidence of intraluminal colon cancer 2. Significant anatomic deformity with fixation, angulation, and stricturing in the mid to distal sigmoid region, at approximately 30 cm from the external anal opening 3. Small area of eroded mucosa in the region of the strictured area, raising the question of possible  infiltration from adjacent extrinsic tumor 4. Extensive diverticulosis involving the left colon   RECOMMENDATIONS: 1. I have discussed the findings with Dr. Julieanne Manson, who will be overseeing the patient's chemotherapy 2. Await pathology results on the eroded area. 3. Okay to resume Lovenox from the standpoint of this procedure 4. Would favor use of MiraLAX as needed to maintain adequate bowel movements  REPEAT EXAM: not necessary in this clinical situation  cc:  _______________________________ eSignedRonald Lobo, MD 02/23/2015 4:08 PM   CPT CODES: ICD CODES:  The ICD and CPT codes recommended by this software are interpretations from the data that the clinical staff has captured with the software.  The verification of the translation of this report to the ICD and CPT codes and modifiers is the sole responsibility of the health care institution and practicing physician where this report was generated.  [facility]. will not be held responsible for the validity of the ICD and CPT codes included on this report.  AMA assumes no liability for data contained or not contained herein. CPT is a Designer, television/film set of the [employer].   PATIENT NAME:  Brianna Vasquez, Brianna Vasquez MR#: [id number]

## 2015-02-09 NOTE — Transfer of Care (Signed)
Immediate Anesthesia Transfer of Care Note  Patient: Brianna Vasquez  Procedure(s) Performed: Procedure(s): COLONOSCOPY WITH PROPOFOL (N/A)  Patient Location: Endoscopy Unit  Anesthesia Type:MAC  Level of Consciousness: sedated and responds to stimulation  Airway & Oxygen Therapy: Patient Spontanous Breathing and Patient connected to face mask oxygen  Post-op Assessment: Report given to RN, Post -op Vital signs reviewed and stable and Patient moving all extremities  Post vital signs: Reviewed and stable  Last Vitals:  Filed Vitals:   02/09/15 1305  BP: 150/52  Temp: 36.7 C  Resp: 16    Complications: No apparent anesthesia complications

## 2015-02-11 ENCOUNTER — Encounter (HOSPITAL_COMMUNITY): Payer: Self-pay | Admitting: Gastroenterology

## 2015-02-13 ENCOUNTER — Other Ambulatory Visit: Payer: Self-pay | Admitting: *Deleted

## 2015-02-13 DIAGNOSIS — C562 Malignant neoplasm of left ovary: Secondary | ICD-10-CM

## 2015-02-14 ENCOUNTER — Ambulatory Visit (HOSPITAL_BASED_OUTPATIENT_CLINIC_OR_DEPARTMENT_OTHER): Payer: BLUE CROSS/BLUE SHIELD

## 2015-02-14 ENCOUNTER — Other Ambulatory Visit (HOSPITAL_BASED_OUTPATIENT_CLINIC_OR_DEPARTMENT_OTHER): Payer: BLUE CROSS/BLUE SHIELD

## 2015-02-14 VITALS — BP 130/71 | HR 90 | Temp 98.4°F | Resp 16

## 2015-02-14 DIAGNOSIS — C7800 Secondary malignant neoplasm of unspecified lung: Secondary | ICD-10-CM | POA: Diagnosis not present

## 2015-02-14 DIAGNOSIS — C562 Malignant neoplasm of left ovary: Secondary | ICD-10-CM

## 2015-02-14 DIAGNOSIS — C5702 Malignant neoplasm of left fallopian tube: Secondary | ICD-10-CM

## 2015-02-14 DIAGNOSIS — C801 Malignant (primary) neoplasm, unspecified: Secondary | ICD-10-CM

## 2015-02-14 DIAGNOSIS — Z5111 Encounter for antineoplastic chemotherapy: Secondary | ICD-10-CM | POA: Diagnosis not present

## 2015-02-14 DIAGNOSIS — C7951 Secondary malignant neoplasm of bone: Secondary | ICD-10-CM

## 2015-02-14 DIAGNOSIS — R300 Dysuria: Secondary | ICD-10-CM

## 2015-02-14 DIAGNOSIS — C787 Secondary malignant neoplasm of liver and intrahepatic bile duct: Secondary | ICD-10-CM

## 2015-02-14 LAB — COMPREHENSIVE METABOLIC PANEL (CC13)
ALT: 15 U/L (ref 0–55)
ANION GAP: 8 meq/L (ref 3–11)
AST: 26 U/L (ref 5–34)
Albumin: 3.6 g/dL (ref 3.5–5.0)
Alkaline Phosphatase: 138 U/L (ref 40–150)
BILIRUBIN TOTAL: 0.58 mg/dL (ref 0.20–1.20)
BUN: 18.7 mg/dL (ref 7.0–26.0)
CHLORIDE: 106 meq/L (ref 98–109)
CO2: 27 meq/L (ref 22–29)
Calcium: 9.4 mg/dL (ref 8.4–10.4)
Creatinine: 1.1 mg/dL (ref 0.6–1.1)
EGFR: 56 mL/min/{1.73_m2} — AB (ref >90)
GLUCOSE: 77 mg/dL (ref 70–140)
POTASSIUM: 4.2 meq/L (ref 3.5–5.1)
SODIUM: 141 meq/L (ref 136–145)
Total Protein: 7.2 g/dL (ref 6.4–8.3)

## 2015-02-14 LAB — CBC WITH DIFFERENTIAL/PLATELET
BASO%: 1.2 % (ref 0.0–2.0)
BASOS ABS: 0.1 10*3/uL (ref 0.0–0.1)
EOS ABS: 1.3 10*3/uL — AB (ref 0.0–0.5)
EOS%: 11.7 % — AB (ref 0.0–7.0)
HEMATOCRIT: 32.3 % — AB (ref 34.8–46.6)
HEMOGLOBIN: 10.3 g/dL — AB (ref 11.6–15.9)
LYMPH#: 2.5 10*3/uL (ref 0.9–3.3)
LYMPH%: 22.1 % (ref 14.0–49.7)
MCH: 28.6 pg (ref 25.1–34.0)
MCHC: 31.9 g/dL (ref 31.5–36.0)
MCV: 89.7 fL (ref 79.5–101.0)
MONO#: 1 10*3/uL — AB (ref 0.1–0.9)
MONO%: 8.7 % (ref 0.0–14.0)
NEUT#: 6.4 10*3/uL (ref 1.5–6.5)
NEUT%: 56.3 % (ref 38.4–76.8)
PLATELETS: 398 10*3/uL (ref 145–400)
RBC: 3.6 10*6/uL — ABNORMAL LOW (ref 3.70–5.45)
RDW: 13.6 % (ref 11.2–14.5)
WBC: 11.4 10*3/uL — ABNORMAL HIGH (ref 3.9–10.3)

## 2015-02-14 MED ORDER — SODIUM CHLORIDE 0.9 % IV SOLN
Freq: Once | INTRAVENOUS | Status: AC
  Administered 2015-02-14: 12:00:00 via INTRAVENOUS
  Filled 2015-02-14: qty 4

## 2015-02-14 MED ORDER — SODIUM CHLORIDE 0.9 % IJ SOLN
10.0000 mL | INTRAMUSCULAR | Status: DC | PRN
  Filled 2015-02-14: qty 10

## 2015-02-14 MED ORDER — LEUCOVORIN CALCIUM INJECTION 350 MG
400.0000 mg/m2 | Freq: Once | INTRAVENOUS | Status: AC
  Administered 2015-02-14: 768 mg via INTRAVENOUS
  Filled 2015-02-14: qty 38.4

## 2015-02-14 MED ORDER — OXALIPLATIN CHEMO INJECTION 100 MG/20ML
85.0000 mg/m2 | Freq: Once | INTRAVENOUS | Status: AC
  Administered 2015-02-14: 165 mg via INTRAVENOUS
  Filled 2015-02-14: qty 33

## 2015-02-14 MED ORDER — HEPARIN SOD (PORK) LOCK FLUSH 100 UNIT/ML IV SOLN
500.0000 [IU] | Freq: Once | INTRAVENOUS | Status: DC | PRN
  Filled 2015-02-14: qty 5

## 2015-02-14 MED ORDER — DEXTROSE 5 % IV SOLN
Freq: Once | INTRAVENOUS | Status: AC
  Administered 2015-02-14: 12:00:00 via INTRAVENOUS

## 2015-02-14 MED ORDER — SODIUM CHLORIDE 0.9 % IV SOLN
2400.0000 mg/m2 | INTRAVENOUS | Status: DC
  Administered 2015-02-14: 4600 mg via INTRAVENOUS
  Filled 2015-02-14: qty 92

## 2015-02-14 MED ORDER — FLUOROURACIL CHEMO INJECTION 2.5 GM/50ML
400.0000 mg/m2 | Freq: Once | INTRAVENOUS | Status: AC
  Administered 2015-02-14: 750 mg via INTRAVENOUS
  Filled 2015-02-14: qty 15

## 2015-02-14 NOTE — Patient Instructions (Signed)

## 2015-02-15 ENCOUNTER — Telehealth: Payer: Self-pay | Admitting: *Deleted

## 2015-02-15 ENCOUNTER — Encounter: Payer: BLUE CROSS/BLUE SHIELD | Admitting: Genetic Counselor

## 2015-02-15 ENCOUNTER — Other Ambulatory Visit: Payer: BLUE CROSS/BLUE SHIELD

## 2015-02-15 LAB — CEA: CEA: 41.3 ng/mL — ABNORMAL HIGH (ref 0.0–5.0)

## 2015-02-15 NOTE — Telephone Encounter (Signed)
-----   Message from Arna Snipe, RN sent at 02/14/2015  4:12 PM EDT ----- Regarding: Dr. Benay Spice - Chemo follow-up call. Contact: [phone] First time Fulfox.

## 2015-02-15 NOTE — Telephone Encounter (Signed)
Pt reports "doing well, I'm wearing gloves when reaching for something cold. I've not been nauseated but taking nausea medication intermittently to prevent nausea".  Is able to eat and drink with no problem. Is aware of cold sensitivity and how to manage. No issues with pump or PAC site. Knows to call us with any questions or concerns and knows when to return for pump d/c.

## 2015-02-16 ENCOUNTER — Other Ambulatory Visit: Payer: Self-pay | Admitting: *Deleted

## 2015-02-16 ENCOUNTER — Other Ambulatory Visit: Payer: Self-pay | Admitting: Gynecologic Oncology

## 2015-02-16 ENCOUNTER — Telehealth: Payer: Self-pay | Admitting: *Deleted

## 2015-02-16 ENCOUNTER — Ambulatory Visit (HOSPITAL_BASED_OUTPATIENT_CLINIC_OR_DEPARTMENT_OTHER): Payer: BLUE CROSS/BLUE SHIELD

## 2015-02-16 VITALS — BP 113/41 | HR 85 | Temp 98.1°F | Resp 16

## 2015-02-16 DIAGNOSIS — C7800 Secondary malignant neoplasm of unspecified lung: Secondary | ICD-10-CM

## 2015-02-16 DIAGNOSIS — C787 Secondary malignant neoplasm of liver and intrahepatic bile duct: Secondary | ICD-10-CM

## 2015-02-16 DIAGNOSIS — C5702 Malignant neoplasm of left fallopian tube: Secondary | ICD-10-CM

## 2015-02-16 DIAGNOSIS — N838 Other noninflammatory disorders of ovary, fallopian tube and broad ligament: Secondary | ICD-10-CM

## 2015-02-16 DIAGNOSIS — C562 Malignant neoplasm of left ovary: Secondary | ICD-10-CM

## 2015-02-16 DIAGNOSIS — G8918 Other acute postprocedural pain: Secondary | ICD-10-CM

## 2015-02-16 DIAGNOSIS — C801 Malignant (primary) neoplasm, unspecified: Secondary | ICD-10-CM

## 2015-02-16 DIAGNOSIS — R1032 Left lower quadrant pain: Secondary | ICD-10-CM

## 2015-02-16 DIAGNOSIS — C7951 Secondary malignant neoplasm of bone: Secondary | ICD-10-CM

## 2015-02-16 MED ORDER — OXYCODONE HCL 5 MG PO TABS: 5.0000 mg | ORAL_TABLET | ORAL | Status: DC | PRN

## 2015-02-16 MED ORDER — GABAPENTIN 300 MG PO CAPS: 600.0000 mg | ORAL_CAPSULE | Freq: Three times a day (TID) | ORAL | Status: DC

## 2015-02-16 MED ORDER — SODIUM CHLORIDE 0.9 % IJ SOLN
10.0000 mL | INTRAMUSCULAR | Status: DC | PRN
  Administered 2015-02-16: 10 mL
  Filled 2015-02-16: qty 10

## 2015-02-16 MED ORDER — HEPARIN SOD (PORK) LOCK FLUSH 100 UNIT/ML IV SOLN
500.0000 [IU] | Freq: Once | INTRAVENOUS | Status: AC | PRN
  Administered 2015-02-16: 500 [IU]
  Filled 2015-02-16: qty 5

## 2015-02-16 NOTE — Telephone Encounter (Signed)
Ned Card to refill pt's oxycodone. Pt also requesting gabapentin refill, states [facility] does not have any refills on file despite original order sent in with 3 refills. [facility] stated someone call in to change prescription to a quantity of 42 with 3 refills, and patient has used up her 3 refills. Joylene John NP was ordering provider for this med; left message with Maudie Mercury in gyn to have Melissa call me for clarification.

## 2015-02-16 NOTE — Patient Instructions (Signed)

## 2015-02-16 NOTE — Progress Notes (Signed)
Patient called the office stating she needs a refill on gabapentin.  Stating when she stopped taking it, she was in moderate pain in her left abdomen but when she resumed the medication the pain lessened significantly.  Advised that we normally only continue gabapentin for a short time post-op.  Situation discussed with Dr. Denman George, who is agreeable with her continuing the medication at this time but would like the medication to be managed/refilled by Dr. Benay Spice in the future.  Refill sent in for patient.  Advised to call for any questions or concerns.

## 2015-02-16 NOTE — Telephone Encounter (Signed)
Per Joylene John NP, do not refill gabapentin; this is only ordered for a short period after surgery. I called pt to let her know, but pt is requesting to continue taking gabapentin, as it is helping a great deal with her pain. Call transferred to GYN clinic.

## 2015-02-16 NOTE — Telephone Encounter (Signed)
TC from patient requesting a refill on her oxycodone 5 mg tabs. Last filled on 02/07/16. She is coming in for 5FU pump d/c this afternoon @ 1:30 pm and is hoping to pick up prescription then.

## 2015-02-21 ENCOUNTER — Telehealth: Payer: Self-pay | Admitting: *Deleted

## 2015-02-21 ENCOUNTER — Telehealth: Payer: Self-pay

## 2015-02-21 ENCOUNTER — Ambulatory Visit (HOSPITAL_BASED_OUTPATIENT_CLINIC_OR_DEPARTMENT_OTHER): Payer: BLUE CROSS/BLUE SHIELD

## 2015-02-21 ENCOUNTER — Other Ambulatory Visit: Payer: Self-pay | Admitting: *Deleted

## 2015-02-21 ENCOUNTER — Telehealth: Payer: Self-pay | Admitting: Oncology

## 2015-02-21 DIAGNOSIS — R350 Frequency of micturition: Secondary | ICD-10-CM

## 2015-02-21 DIAGNOSIS — R300 Dysuria: Secondary | ICD-10-CM

## 2015-02-21 DIAGNOSIS — C562 Malignant neoplasm of left ovary: Secondary | ICD-10-CM

## 2015-02-21 LAB — URINALYSIS, MICROSCOPIC - CHCC
BILIRUBIN (URINE): NEGATIVE
Glucose: NEGATIVE mg/dL
KETONES: NEGATIVE mg/dL
Nitrite: NEGATIVE
Protein: 300 mg/dL
Specific Gravity, Urine: 1.03 (ref 1.003–1.035)
Urobilinogen, UR: 0.2 mg/dL (ref 0.2–1)
pH: 6 (ref 4.6–8.0)

## 2015-02-21 NOTE — Telephone Encounter (Signed)
Called patient and she is coming in today around 1:15

## 2015-02-21 NOTE — Telephone Encounter (Signed)
Received vm message from pt regarding possible recurrence of UTI.  Returned call to patient. She states she has been treated for UTI within last 2 weeks with Cipro 500 mg BID for 5 days.  She states her symptoms got better for a little while but have returned.  She is experiencing frequency, pain and burning with voiding. Denies fever, chills, or flank pain. Is currently receiving chemo (Folfox) and it is due next on 02/28/15. Pt to come in to lab to give urine sample for repeat u/a and culture today. POF sent  Spoke with Selena Lesser, NP to make her aware that pt coming in to give urine sample today.

## 2015-02-21 NOTE — Telephone Encounter (Signed)
Patient collected urine specimen.  C/o of similar symptoms to what she had a few weeks ago.  States she has had these symptoms since having the stent placed.  Per patient she is to have stent removed in 3 months.  This nurse tried to call [facility] to have the patient scheduled in their office and was placed on hold for 1mins until phone hung up and told me to call 911 with an emergency at 5pm.  Urine results faxed to [facility] with nurse note to please call patient with appointment.   Number also given to patient and she was instructed to call [facility] in the morning to have them treat since they placed the stent per Selena Lesser, NP.  Instructed patient to call us back with any questions or concerns or if [facility] was unable to see her.  Patient  Verbalized understanding.

## 2015-02-22 LAB — URINE CULTURE

## 2015-02-25 ENCOUNTER — Other Ambulatory Visit: Payer: Self-pay | Admitting: Oncology

## 2015-02-27 ENCOUNTER — Other Ambulatory Visit (HOSPITAL_BASED_OUTPATIENT_CLINIC_OR_DEPARTMENT_OTHER): Payer: BLUE CROSS/BLUE SHIELD

## 2015-02-27 ENCOUNTER — Encounter: Payer: Self-pay | Admitting: Genetic Counselor

## 2015-02-27 ENCOUNTER — Ambulatory Visit (HOSPITAL_BASED_OUTPATIENT_CLINIC_OR_DEPARTMENT_OTHER): Payer: BLUE CROSS/BLUE SHIELD | Admitting: Genetic Counselor

## 2015-02-27 DIAGNOSIS — Z315 Encounter for genetic counseling: Secondary | ICD-10-CM

## 2015-02-27 DIAGNOSIS — R300 Dysuria: Secondary | ICD-10-CM

## 2015-02-27 DIAGNOSIS — C562 Malignant neoplasm of left ovary: Secondary | ICD-10-CM

## 2015-02-27 DIAGNOSIS — Z803 Family history of malignant neoplasm of breast: Secondary | ICD-10-CM | POA: Diagnosis not present

## 2015-02-27 DIAGNOSIS — Z801 Family history of malignant neoplasm of trachea, bronchus and lung: Secondary | ICD-10-CM | POA: Diagnosis not present

## 2015-02-27 DIAGNOSIS — Z8481 Family history of carrier of genetic disease: Secondary | ICD-10-CM

## 2015-02-27 DIAGNOSIS — Z800 Family history of malignant neoplasm of digestive organs: Secondary | ICD-10-CM | POA: Diagnosis not present

## 2015-02-27 DIAGNOSIS — C801 Malignant (primary) neoplasm, unspecified: Secondary | ICD-10-CM

## 2015-02-27 DIAGNOSIS — Z808 Family history of malignant neoplasm of other organs or systems: Secondary | ICD-10-CM

## 2015-02-27 LAB — COMPREHENSIVE METABOLIC PANEL (CC13)
ALT: 19 U/L (ref 0–55)
ANION GAP: 7 meq/L (ref 3–11)
AST: 33 U/L (ref 5–34)
Albumin: 3.5 g/dL (ref 3.5–5.0)
Alkaline Phosphatase: 181 U/L — ABNORMAL HIGH (ref 40–150)
BILIRUBIN TOTAL: 0.72 mg/dL (ref 0.20–1.20)
BUN: 10.2 mg/dL (ref 7.0–26.0)
CHLORIDE: 107 meq/L (ref 98–109)
CO2: 28 meq/L (ref 22–29)
Calcium: 9.4 mg/dL (ref 8.4–10.4)
Creatinine: 1 mg/dL (ref 0.6–1.1)
EGFR: 68 mL/min/{1.73_m2} — AB (ref >90)
GLUCOSE: 82 mg/dL (ref 70–140)
Potassium: 3.9 mEq/L (ref 3.5–5.1)
SODIUM: 142 meq/L (ref 136–145)
TOTAL PROTEIN: 7.4 g/dL (ref 6.4–8.3)

## 2015-02-27 LAB — CBC WITH DIFFERENTIAL/PLATELET
BASO%: 1.7 % (ref 0.0–2.0)
Basophils Absolute: 0.1 10*3/uL (ref 0.0–0.1)
EOS%: 9.4 % — ABNORMAL HIGH (ref 0.0–7.0)
Eosinophils Absolute: 0.7 10*3/uL — ABNORMAL HIGH (ref 0.0–0.5)
HCT: 31.5 % — ABNORMAL LOW (ref 34.8–46.6)
HGB: 10.2 g/dL — ABNORMAL LOW (ref 11.6–15.9)
LYMPH%: 29.3 % (ref 14.0–49.7)
MCH: 28.6 pg (ref 25.1–34.0)
MCHC: 32.5 g/dL (ref 31.5–36.0)
MCV: 88 fL (ref 79.5–101.0)
MONO#: 0.7 10*3/uL (ref 0.1–0.9)
MONO%: 9.5 % (ref 0.0–14.0)
NEUT%: 50.1 % (ref 38.4–76.8)
NEUTROS ABS: 3.6 10*3/uL (ref 1.5–6.5)
PLATELETS: 237 10*3/uL (ref 145–400)
RBC: 3.58 10*6/uL — AB (ref 3.70–5.45)
RDW: 14 % (ref 11.2–14.5)
WBC: 7.2 10*3/uL (ref 3.9–10.3)
lymph#: 2.1 10*3/uL (ref 0.9–3.3)

## 2015-02-27 NOTE — Progress Notes (Signed)
REFERRING PROVIDER: Betsy Coder, MD  PRIMARY PROVIDER:  Milagros Evener, MD  PRIMARY REASON FOR VISIT:  1. Ovarian cancer on left   2. Family history of BRCA gene positive   3. Family history of breast cancer in female   47. Family history of pancreatic cancer   5. Family history of colon cancer   6. Family history of skin cancer   7. Family history of lung cancer   8. Family history of throat cancer      HISTORY OF PRESENT ILLNESS:   Brianna Vasquez, a 54 y.o. female, was seen for a [facility] cancer genetics consultation at the request of Dr. Radene Ou due to a personal history of ovarian cancer and family history a BRCA mutation, as well as a family history of breast, pancreatic, and other cancers.  Brianna Vasquez presents to clinic today with her sister and daughter-in-law to discuss the possibility of a hereditary predisposition to cancer, genetic testing, and to further clarify her future cancer risks, as well as potential cancer risks for family members.   In July 2016, at the age of 86, Brianna Vasquez was diagnosed with mucinous adenocarcinoma of the left ovary. This was treated with bilateral oophorectomy and chemotherapy.  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   CANCER HISTORY:  Oncology History   Presented with lower abdominal discomfort      HORMONAL RISK FACTORS:  Menarche was at age 89.  First live birth at age 9.  OCP use for approximately 15 years.  Ovaries intact: no.  Hysterectomy: yes.  Menopausal status: postmenopausal.  HRT use: 0 years. Colonoscopy: yes; no polyps but diverticulosis. Mammogram within the last year: yes, but reports that she did not always have them regularly prior to her lumpectomy. Number of breast biopsies: 1. Up to date with pelvic exams:  Reports that she "does not receive them often enough". Any excessive radiation exposure in the past:  no  Past Medical History  Diagnosis Date  .  Wears glasses   . Diverticulosis     dx 09/2014  . Esophagus hernia     hx of  . GERD (gastroesophageal reflux disease)     once in a while  . Anemia     hx of, none since hysterectomy  . Headache     severe, for last few mornings  . Cancer     "ovary tumor-on colon, kidney, spots on liver and lungs"  . Anxiety     recent, situational    Past Surgical History  Procedure Laterality Date  . Wisdom tooth extraction  ~2001  . Cholecystectomy  2007    lap choli  . Breast lumpectomy Right 10/2013  . Vaginal hysterectomy  2005  . Colonoscopy  2016  . Laparotomy N/A 01/16/2015    Procedure: EXPLORATORY LAPAROTOMY ;  Surgeon: Nancy Marus, MD;  Location: [facility];  Service: Gynecology;  Laterality: N/A;  . Oophorectomy Bilateral 01/16/2015    Procedure: BILATERAL OOPHORECTOMY RESECTION OF PELVIC MASS ;  Surgeon: Nancy Marus, MD;  Location: [facility];  Service: Gynecology;  Laterality: Bilateral;  . Cystoscopy w/ ureteral stent placement Left 01/16/2015    Procedure: CYSTOSCOPY WITH RETROGRADE PYELOGRAM/URETERAL STENT PLACEMENT;  Surgeon: Carolan Clines, MD;  Location: [facility];  Service: Urology;  Laterality: Left;  . Porta cath insertion    . Colonoscopy with propofol N/A 02/09/2015    Procedure: COLONOSCOPY WITH PROPOFOL;  Surgeon: Ronald Lobo, MD;  Location: [facility];  Service: Endoscopy;  Laterality: N/A;    Social History   Social History  . Marital Status: Single    Spouse Name: N/A  . Number of Children: N/A  . Years of Education: N/A   Social History Main Topics  . Smoking status: Former Smoker -- 4 years    Types: Cigarettes    Quit date: 10/07/1985  . Smokeless tobacco: Never Used     Comment: 2-3 cigs per day  . Alcohol Use: 4.2 oz/week    7 Glasses of wine per week     Comment: glass nightly  . Drug Use: No  . Sexual Activity: Not on file   Other Topics Concern  . Not on file   Social History Narrative   Single, significant other Vernia Buff)   Has #2  children; works as Archivist   Sister, Carlene Coria (nurse)   Daughter Vedia Pereyra      FAMILY HISTORY:  We obtained a detailed, 4-generation family history.  Significant diagnoses are listed below: Family History  Problem Relation Age of Onset  . Pancreatic cancer Father 41  . Breast cancer Paternal Aunt     dx. 84-41  . Breast cancer Paternal Grandmother 12  . Cancer Paternal Grandmother     "adenocarcinoma of gall bladder possibly"  . Colon cancer Maternal Grandfather 79  . Alzheimer's disease Paternal Grandfather   . Skin cancer Mother     dx. 26-61; squamous cell carcinoma  . Colon polyps Mother     6-7 total  . Other Mother     TAH due to issue with IUD; abnormal breast findings  . Other Sister     TAH due to pre-cancerous cells of cervix; still has ovarie  . Breast cancer Cousin     dx. 61 or younger; radical mastectomy; BRCA+  . Throat cancer Cousin     dx. 30; smokeless tobacco user  . Pancreatic cancer Other   . Lung cancer Cousin   . Pancreatic cancer Other     Brianna Vasquez has one daughter, age 53, and one son, age 49.  Brianna Vasquez currently has one grandson with a granddaughter on the way.  Brianna Vasquez has one full sister, Brianna Vasquez, who is currently 25 and has never had cancer.  However, this sister reports that she has not yet had a colonoscopy and that she had a hysterectomy (still has her ovaries) at the age of 53 due to precancerous cervical cells.  Brianna Vasquez mother is currently 71 and has a history of squamous cell carcinomas, first diagnosed at 110.  She also has a history of about 6-7 total polyps on colonoscopy, and she has a history of a hysterectomy for an IUD issue.  Brianna Vasquez father died of pancreatic cancer at 54.    There is a paternal family history of breast cancer.  Brianna Vasquez father had one full brother and one full sister.  His sister was diagnosed with breast cancer at the age of 72.  This cancer metastasized and she died at 8.  She had two  daughters and one son--one of her daughters was diagnosed with breast cancer in her 20s.  This daughter reportedly tested positive for a BRCA1 mutation and followed up with that with a "radical mastectomy and total hysterectomy".  Brianna Vasquez paternal uncle is currently 70 and has not had cancer.  He has two sons and two daughters; one son was diagnosed with throat cancer at 45 and  was also a smokeless tobacco user.  Brianna Vasquez paternal grandmother was diagnosed with breast cancer at 27 and with another adenocarcinoma later on--potentially a gall bladder cancer.  She passed away at 81.  She had several brothers and two sisters; at least one brother was diagnosed with pancreatic cancer in his late 10s-early 70s.  Brianna Vasquez paternal grandfather died with Alzheimer's in his late 51s; one of his nieces was diagnosed with lung cancer.  Brianna Vasquez mother has one full brother and two full sisters.  Her brother passed away at 75 but never had cancer.  Both sisters are alive in their late 88s-early 70s and have not had cancer.  Brianna Vasquez maternal grandmother died at 67; she had three brothers and two sisters, and one brother had a history of pancreatic cancer.  Brianna Vasquez maternal grandfather was diagnosed with colon cancer at 53; he died at 52.    Brianna Vasquez and her sister are unaware of any additional cancer diagnoses in the family.  They are also unaware of any additional relatives having genetic testing other than their paternal first cousin who tested positive.  Patient's maternal ancestors are of German/Sc[place]ch-Irish descent, and paternal ancestors are of [place] descent. There is no reported Ashkenazi Jewish ancestry. Ms. Moffat reports that her paternal grandmother and paternal grandfather were second cousins.   GENETIC COUNSELING ASSESSMENT: AREYA LEMMERMAN is a 54 y.o. female with a personal and family history of cancer which somewhat is suggestive of a hereditary  breast/ovarian cancer syndrome and predisposition to cancer. We, therefore, discussed and recommended the following at today's visit.   DISCUSSION: We reviewed the characteristics, features and inheritance patterns of hereditary cancer syndromes, particularly those caused by mutations within the BRCA1/2 genes. We also discussed genetic testing, including the appropriate family members to test, the process of testing, insurance coverage and turn-around-time for results. We discussed the implications of a negative, positive and/or variant of uncertain significant result. We discussed that, based on the information we have for her paternal first cousin, that a positive result would most likely involve the BRCA genes.  However, since we do not have a test report for Brianna Vasquez cousin, we could test for a panel of genes related to breast/ovarian cancer risks, so that we may be certain we are not missing anything.  Ms. Boch is agreeable with this plan.  Thus, we recommended Ms. Aragones pursue genetic testing for the 20-gene Breast/Ovarian Cancer Panel through [facility] Hope Pigeon, MD).  The Breast/Ovarian Cancer Panel offered by [facility] includes sequencing and deletion/duplication analysis for the following 19 genes:  ATM, BARD1, BRCA1, BRCA2, BRIP1, CDH1, CHEK2, FANCC, MLH1, MSH2, MSH6, NBN, PALB2, PMS2, PTEN, RAD51C, RAD51D, TP53, and XRCC2.  This panel also includes deletion/duplication analysis (without sequencing) for one gene, EPCAM.   Based on Ms. Otwell personal and family history of cancer, she meets medical criteria for genetic testing. Despite that she meets criteria, she may still have an out of pocket cost. We discussed that if her out of pocket cost for testing is over $100, the laboratory will call and confirm whether she wants to proceed with testing.  If the out of pocket cost of testing is less than $100 she will be billed by the genetic testing laboratory.   PLAN: After  considering the risks, benefits, and limitations, Ms. Sittner  provided informed consent to pursue genetic testing and the blood sample was sent to [facility] for analysis of the 20-gene Breast/Ovarian Cancer  Panel test. Results should be available within approximately 2-3 weeks' time, at which point they will be disclosed by telephone to Ms. Quirion, as will any additional recommendations warranted by these results. Ms. Vanhoose will receive a summary of her genetic counseling visit and a copy of her results once available. This information will also be available in Epic. We encouraged Ms. Grussing to remain in contact with cancer genetics annually so that we can continuously update the family history and inform her of any changes in cancer genetics and testing that may be of benefit for her family. Ms. Sather questions were answered to her satisfaction today. Our contact information was provided should additional questions or concerns arise.  We also recommended that, regardless of Ms. Deitrick results, based on the family history of a BRCA mutation, that Ms. Feasel sister also have genetic counseling and testing. Rena, Ms. Slape sister would like to await Ms. Byington own test results and/or a copy of a test report from her paternal first cousin before she proceeds with genetic testing for herself.  Lastly, we encouraged Ms. Cornforth to remain in contact with cancer genetics annually so that we can continuously update the family history and inform her of any changes in cancer genetics and testing that may be of benefit for this family.   Ms.  Laury questions were answered to her satisfaction today. Our contact information was provided should additional questions or concerns arise. Thank you for the referral and allowing [place] to share in the care of your patient.   Jeanine Luz, MS Genetic Counselor [email or web address] Phone: [phone]  The patient was seen for a total of 60 minutes  in face-to-face genetic counseling.  This patient was discussed with Drs. Magrinat, Lindi Adie and/or Burr Medico who agrees with the above.    _______________________________________________________________________ For Office Staff:  Number of people involved in session: 3 Was an Intern/ student involved with case: no

## 2015-02-28 ENCOUNTER — Other Ambulatory Visit: Payer: BLUE CROSS/BLUE SHIELD

## 2015-02-28 ENCOUNTER — Ambulatory Visit (HOSPITAL_BASED_OUTPATIENT_CLINIC_OR_DEPARTMENT_OTHER): Payer: BLUE CROSS/BLUE SHIELD | Admitting: Oncology

## 2015-02-28 ENCOUNTER — Encounter: Payer: BLUE CROSS/BLUE SHIELD | Admitting: Nutrition

## 2015-02-28 ENCOUNTER — Ambulatory Visit (HOSPITAL_BASED_OUTPATIENT_CLINIC_OR_DEPARTMENT_OTHER): Payer: BLUE CROSS/BLUE SHIELD

## 2015-02-28 ENCOUNTER — Ambulatory Visit: Payer: BLUE CROSS/BLUE SHIELD | Admitting: Nutrition

## 2015-02-28 VITALS — BP 142/75 | HR 91 | Temp 97.7°F | Resp 17 | Ht 65.0 in | Wt 170.8 lb

## 2015-02-28 DIAGNOSIS — C7951 Secondary malignant neoplasm of bone: Secondary | ICD-10-CM

## 2015-02-28 DIAGNOSIS — C5702 Malignant neoplasm of left fallopian tube: Secondary | ICD-10-CM | POA: Diagnosis not present

## 2015-02-28 DIAGNOSIS — C562 Malignant neoplasm of left ovary: Secondary | ICD-10-CM

## 2015-02-28 DIAGNOSIS — C801 Malignant (primary) neoplasm, unspecified: Secondary | ICD-10-CM

## 2015-02-28 DIAGNOSIS — G8918 Other acute postprocedural pain: Secondary | ICD-10-CM | POA: Diagnosis not present

## 2015-02-28 DIAGNOSIS — C7800 Secondary malignant neoplasm of unspecified lung: Secondary | ICD-10-CM

## 2015-02-28 DIAGNOSIS — Z5111 Encounter for antineoplastic chemotherapy: Secondary | ICD-10-CM

## 2015-02-28 DIAGNOSIS — C787 Secondary malignant neoplasm of liver and intrahepatic bile duct: Secondary | ICD-10-CM

## 2015-02-28 MED ORDER — LEUCOVORIN CALCIUM INJECTION 350 MG
400.0000 mg/m2 | Freq: Once | INTRAMUSCULAR | Status: AC
  Administered 2015-02-28: 768 mg via INTRAVENOUS
  Filled 2015-02-28: qty 38.4

## 2015-02-28 MED ORDER — OXYCODONE HCL 5 MG PO TABS: 5.0000 mg | ORAL_TABLET | ORAL | Status: DC | PRN

## 2015-02-28 MED ORDER — FLUOROURACIL CHEMO INJECTION 2.5 GM/50ML
400.0000 mg/m2 | Freq: Once | INTRAVENOUS | Status: AC
  Administered 2015-02-28: 750 mg via INTRAVENOUS
  Filled 2015-02-28: qty 15

## 2015-02-28 MED ORDER — PALONOSETRON HCL INJECTION 0.25 MG/5ML
INTRAVENOUS | Status: AC
  Filled 2015-02-28: qty 5

## 2015-02-28 MED ORDER — ALPRAZOLAM 0.5 MG PO TABS: 0.5000 mg | ORAL_TABLET | Freq: Three times a day (TID) | ORAL | Status: DC | PRN

## 2015-02-28 MED ORDER — HEPARIN SOD (PORK) LOCK FLUSH 100 UNIT/ML IV SOLN
500.0000 [IU] | Freq: Once | INTRAVENOUS | Status: DC | PRN
  Filled 2015-02-28: qty 5

## 2015-02-28 MED ORDER — PALONOSETRON HCL INJECTION 0.25 MG/5ML
0.2500 mg | Freq: Once | INTRAVENOUS | Status: AC
  Administered 2015-02-28: 0.25 mg via INTRAVENOUS

## 2015-02-28 MED ORDER — SODIUM CHLORIDE 0.9 % IJ SOLN
10.0000 mL | INTRAMUSCULAR | Status: DC | PRN
  Filled 2015-02-28: qty 10

## 2015-02-28 MED ORDER — ONDANSETRON HCL 8 MG PO TABS: 8.0000 mg | ORAL_TABLET | Freq: Three times a day (TID) | ORAL | Status: DC | PRN

## 2015-02-28 MED ORDER — OXALIPLATIN CHEMO INJECTION 100 MG/20ML
85.0000 mg/m2 | Freq: Once | INTRAVENOUS | Status: AC
  Administered 2015-02-28: 165 mg via INTRAVENOUS
  Filled 2015-02-28: qty 33

## 2015-02-28 MED ORDER — SODIUM CHLORIDE 0.9 % IV SOLN
Freq: Once | INTRAVENOUS | Status: AC
  Administered 2015-02-28: 13:00:00 via INTRAVENOUS
  Filled 2015-02-28: qty 5

## 2015-02-28 MED ORDER — SODIUM CHLORIDE 0.9 % IV SOLN
2400.0000 mg/m2 | INTRAVENOUS | Status: DC
  Administered 2015-02-28: 4600 mg via INTRAVENOUS
  Filled 2015-02-28: qty 92

## 2015-02-28 MED ORDER — DEXTROSE 5 % IV SOLN
Freq: Once | INTRAVENOUS | Status: AC
  Administered 2015-02-28: 12:00:00 via INTRAVENOUS

## 2015-02-28 NOTE — Progress Notes (Signed)
[facility] OFFICE PROGRESS NOTE   Diagnosis: Ovarian cancer  INTERVAL HISTORY:   Brianna Vasquez returns for scheduled visit. She completed a first cycle of FOLFOX 02/14/2015. She reports developing incontinence while in the chemotherapy infusion Center. This resolved prior to leaving. She had cold sensitivity following chemotherapy. No other neuropathy symptoms. She developed source at the lower lip. No diarrhea. Nausea persisted for greater than one week following chemotherapy. No emesis. Compazine did not help the nausea. She continues to have pain at the left pelvis and left leg. The leg pain is improved with gabapentin and naproxen. She takes oxycodone for the left pelvic pain.  She was taken for colonoscopy by Dr. Ernest Haber 02/09/2015. No intrinsic colon mass was seen. Stricturing was noted at 30 cm with a small area of eroded mucosa. A biopsy confirmed adenocarcinoma.  Objective:  Vital signs in last 24 hours:  Blood pressure 142/75, pulse 91, temperature 97.7 F (36.5 C), temperature source Oral, resp. rate 17, height $RemoveBe'5\' 5"'ixxSBTOiX$  (1.651 m), weight 170 lb 12.8 oz (77.474 kg), SpO2 99 %.    HEENT: No thrush or ulcers Resp: Lungs clear bilaterally Cardio: Regular rate and rhythm GI: No hepatomegaly, no mass Vascular: No leg edema  Skin: Palms without erythema Musculoskeletal: Pain at the left groin with motion at the left hip  Portacath/PICC-without erythema  Lab Results:  Lab Results  Component Value Date   WBC 7.2 02/27/2015   HGB 10.2* 02/27/2015   HCT 31.5* 02/27/2015   MCV 88.0 02/27/2015   PLT 237 02/27/2015   NEUTROABS 3.6 02/27/2015      Lab Results  Component Value Date   CEA 41.3* 02/14/2015    Medications: I have reviewed the patient's current medications.  Assessment/Plan: 1. Mucinous adenocarcinoma involving the left ovary and fallopian tube, status post a bilateral salpingo-oophorectomy on 01/16/2015, most likely an ovarian  primary  Pathology confirmed a mucinous adenocarcinoma involving the left ovary, CDX-2 and CEA positive  CT of the abdomen and pelvis 01/03/2015 confirmed a left adnexal mass, left hydroureteronephrosis, and metastatic lung/liver lesions. Metastatic left periaortic adenopathy.  PET scan 02/06/2015 with extensive hypermetabolic lymphadenopathy in the chest, abdomen, and pelvis, Residual left pelvic tumor, sigmoid colon lesion, liver/lung metastases, and a left acetabulum metastasis  Colonoscopy 02/09/2015 with a stricture at 30 cm, biopsy positive for adenocarcinoma, no intrinsic colon mass noted  Cycle 1 FOLFOX 02/14/2015  Cycle 2 FOLFOX 02/28/2015  2. Placement of a left ureter stent 01/16/2015  3. Family history of multiple cancers, report of a paternal cousin-BRCA2 positive  4. Upper endoscopy 01/29/2015 with a localized thick gastric fold with a small ulcer  5. Mild swelling of the left foot and ankle 01/27/2015  6. Pain secondary to the left pelvic tumor and acetabulum metastasis  7.   Acute/delayed nausea following cycle 1 FOLFOX-Aloxi and amend will be added with cycle 2  8.  Acute transient loss of bladder control with cycle 1 FOLFOX    Disposition:  Brianna Vasquez completed one cycle of FOLFOX. She developed acute/delayed nausea. We adjusted the anti-emetic regimen today. The loss of bladder control during the last cycle of chemotherapy was likely related to acute oxaliplatin neurotoxicity. She understands this may occur again today. She continues to have pain related to the left acetabular metastasis and tumor in the left pelvis. We refilled a prescription for oxycodone.  Brianna Vasquez will return for an office visit and chemotherapy in 2 weeks. We will check the CEA when she returns in  2 weeks.  She will contact us for nausea following this cycle. We we prescribed Zofran to use as needed.   Betsy Coder, MD  02/28/2015  11:44 AM

## 2015-02-28 NOTE — Progress Notes (Signed)
Patient was identified to be at risk for malnutrition on the MST secondary to weight loss and poor appetite.  Patient is a 54 year old female diagnosed with ovarian cancer.  She is a patient of Dr. Julieanne Manson.  Past medical history includes diverticulosis, GERD, anemia, and headaches.  Medications include Xanax, Tums, Neurontin, Compazine, Carafate, and Zofran.  Labs were reviewed.  Height: 65 inches. Weight: 170.8 pounds.   Usual body weight 179 pounds in July 2016. BMI: 28.42  Patient reports nausea for greater than one week after first cycle of FOLFOX. Noted M.D. added Aloxi and Emend along with Zofran. Patient reports she did not have emesis but did not have a good appetite. Patient endorses approximately 9 pound weight loss. Patient is struggling with what to eat since she cannot tolerate cold foods directly after chemotherapy.  Nutrition diagnosis: Food and nutrition related knowledge deficit related to ovarian cancer as evidenced by no prior need for nutrition related information.  Intervention: Educated patient on strategies for eating with nausea and vomiting. Encouraged frequent small meals and snacks that are high in protein. Encouraged adequate calories to avoid further weight loss. Provided warm supplement recipes for after FOLFOX treatments. Questions were answered.  Teach back method used.  Contact information was given. Fact sheets provided on poor appetite and increasing calories and protein as well as nausea and vomiting.  Monitoring, evaluation, goals: Patient will tolerate adequate calories and protein for minimal weight loss.  Next visit: Patient to contact me for questions or concerns.  **Disclaimer: This note was dictated with voice recognition software. Similar sounding words can inadvertently be transcribed and this note may contain transcription errors which may not have been corrected upon publication of note.**

## 2015-02-28 NOTE — Progress Notes (Signed)
Blood return noted before, every 5cc, and after Adrucil push.

## 2015-02-28 NOTE — Patient Instructions (Signed)
[facility] Discharge Instructions for Patients Receiving Chemotherapy  Today you received the following chemotherapy agents: Oxaliplatin, Leucovorin, and Adrucil   To help prevent nausea and vomiting after your treatment, we encourage you to take your nausea medication as directed.    If you develop nausea and vomiting that is not controlled by your nausea medication, call the clinic.   BELOW ARE SYMPTOMS THAT SHOULD BE REPORTED IMMEDIATELY:  *FEVER GREATER THAN 100.5 F  *CHILLS WITH OR WITHOUT FEVER  NAUSEA AND VOMITING THAT IS NOT CONTROLLED WITH YOUR NAUSEA MEDICATION  *UNUSUAL SHORTNESS OF BREATH  *UNUSUAL BRUISING OR BLEEDING  TENDERNESS IN MOUTH AND THROAT WITH OR WITHOUT PRESENCE OF ULCERS  *URINARY PROBLEMS  *BOWEL PROBLEMS  UNUSUAL RASH Items with * indicate a potential emergency and should be followed up as soon as possible.  Feel free to call the clinic you have any questions or concerns. The clinic phone number is [phone].  Please show the CHEMO ALERT CARD at check-in to the Emergency Department and triage nurse.   

## 2015-03-01 ENCOUNTER — Other Ambulatory Visit: Payer: Self-pay | Admitting: *Deleted

## 2015-03-01 ENCOUNTER — Telehealth: Payer: Self-pay | Admitting: *Deleted

## 2015-03-01 DIAGNOSIS — C801 Malignant (primary) neoplasm, unspecified: Secondary | ICD-10-CM

## 2015-03-01 DIAGNOSIS — G8918 Other acute postprocedural pain: Secondary | ICD-10-CM

## 2015-03-01 MED ORDER — OXYCODONE HCL 5 MG PO TABS: ORAL_TABLET | ORAL | Status: DC

## 2015-03-01 NOTE — Telephone Encounter (Signed)
Per Dr. Benay Spice; notified pt that re-fill script will be ready for p/u tomorrow.  Pt verbalized understanding and expressed appreciation for call back.

## 2015-03-01 NOTE — Telephone Encounter (Signed)
Pt called states "I'm taking my oxycodone 2 tablets every 4 hours instead of 1 tab every 4 hours; this keeps my pain under control; I'm going to run out before next visit and would Dr. Benay Spice give me another prescription when I come in for my pump d/c tomorrow?"  Pt report abdominal, left hip pain; on scale 1-10 "its a 3-4 right now"  Pt states she is doing well on the chemo and not having any problems; able to eat/drink; denies fever/N/V and is taking her anti-emetic at times.  Informed pt office will call back after discussing with MD.  Pt verbalized understanding.

## 2015-03-02 ENCOUNTER — Ambulatory Visit (HOSPITAL_BASED_OUTPATIENT_CLINIC_OR_DEPARTMENT_OTHER): Payer: BLUE CROSS/BLUE SHIELD

## 2015-03-02 ENCOUNTER — Telehealth: Payer: Self-pay | Admitting: *Deleted

## 2015-03-02 VITALS — BP 138/62 | HR 78 | Temp 97.3°F | Resp 15

## 2015-03-02 DIAGNOSIS — C562 Malignant neoplasm of left ovary: Secondary | ICD-10-CM | POA: Diagnosis not present

## 2015-03-02 DIAGNOSIS — C5702 Malignant neoplasm of left fallopian tube: Secondary | ICD-10-CM | POA: Diagnosis not present

## 2015-03-02 DIAGNOSIS — Z452 Encounter for adjustment and management of vascular access device: Secondary | ICD-10-CM | POA: Diagnosis not present

## 2015-03-02 MED ORDER — HEPARIN SOD (PORK) LOCK FLUSH 100 UNIT/ML IV SOLN
500.0000 [IU] | Freq: Once | INTRAVENOUS | Status: AC | PRN
  Administered 2015-03-02: 500 [IU]
  Filled 2015-03-02: qty 5

## 2015-03-02 MED ORDER — SODIUM CHLORIDE 0.9 % IJ SOLN
10.0000 mL | INTRAMUSCULAR | Status: DC | PRN
  Administered 2015-03-02: 10 mL
  Filled 2015-03-02: qty 10

## 2015-03-02 NOTE — Telephone Encounter (Signed)
ON 02/28/15 PT. RECEIVED A TEN DAY SUPPLY OF OXYCODONE 5MG  WITH THE DIRECTIONS OF ONE EVERY FOUR HOURS AS NEEDED FOR PAIN. IT IS TOO EARLIER TO FILL THE 03/01/15 PRESCRIPTION. VERBAL ORDER AND READ BACK TO DR.SHERRILL- PT. IS HAVING INCREASED PAIN AND IS TAKING TWO EVERY FOUR HOURS AS NEEDED FOR PAIN. MAY FILL THE 03/01/15 PRESCRIPTION. NOTIFIED STEPHANIE.

## 2015-03-05 ENCOUNTER — Telehealth: Payer: Self-pay | Admitting: *Deleted

## 2015-03-05 MED ORDER — DEXAMETHASONE 4 MG PO TABS: ORAL_TABLET | ORAL | Status: DC

## 2015-03-05 NOTE — Telephone Encounter (Signed)
FLUID INTAKE IN THE PAST 24 HOURS HAS BEEN 16 TO 24 OUNCES. ZOFRAN IS NOT HELPING. PT. DID NOT TRY THE COMPAZINE BECAUSE IT DID NOT HELP IN THE PAST. VERBAL ORDER AND READ BACK TO DR.SHERRILL- DECADRON 4MG  TAKE TWO TABS TWICE A DAY FOR TWO DAYS. NOTIFIED PT.

## 2015-03-06 ENCOUNTER — Telehealth: Payer: Self-pay | Admitting: *Deleted

## 2015-03-06 ENCOUNTER — Ambulatory Visit (HOSPITAL_BASED_OUTPATIENT_CLINIC_OR_DEPARTMENT_OTHER): Payer: BLUE CROSS/BLUE SHIELD | Admitting: Nurse Practitioner

## 2015-03-06 ENCOUNTER — Ambulatory Visit (HOSPITAL_BASED_OUTPATIENT_CLINIC_OR_DEPARTMENT_OTHER): Payer: BLUE CROSS/BLUE SHIELD

## 2015-03-06 VITALS — BP 140/65 | HR 85 | Temp 98.2°F | Resp 19 | Ht 65.0 in | Wt 161.1 lb

## 2015-03-06 DIAGNOSIS — C562 Malignant neoplasm of left ovary: Secondary | ICD-10-CM

## 2015-03-06 DIAGNOSIS — C5702 Malignant neoplasm of left fallopian tube: Secondary | ICD-10-CM | POA: Diagnosis not present

## 2015-03-06 DIAGNOSIS — E860 Dehydration: Secondary | ICD-10-CM | POA: Diagnosis not present

## 2015-03-06 DIAGNOSIS — R110 Nausea: Secondary | ICD-10-CM

## 2015-03-06 DIAGNOSIS — C7951 Secondary malignant neoplasm of bone: Secondary | ICD-10-CM

## 2015-03-06 DIAGNOSIS — C7800 Secondary malignant neoplasm of unspecified lung: Secondary | ICD-10-CM

## 2015-03-06 LAB — CBC WITH DIFFERENTIAL/PLATELET
BASO%: 0.1 % (ref 0.0–2.0)
Basophils Absolute: 0 10*3/uL (ref 0.0–0.1)
EOS%: 0.1 % (ref 0.0–7.0)
Eosinophils Absolute: 0 10*3/uL (ref 0.0–0.5)
HEMATOCRIT: 36 % (ref 34.8–46.6)
HGB: 11.8 g/dL (ref 11.6–15.9)
LYMPH#: 0.8 10*3/uL — AB (ref 0.9–3.3)
LYMPH%: 21.4 % (ref 14.0–49.7)
MCH: 28.4 pg (ref 25.1–34.0)
MCHC: 32.8 g/dL (ref 31.5–36.0)
MCV: 86.7 fL (ref 79.5–101.0)
MONO#: 0.1 10*3/uL (ref 0.1–0.9)
MONO%: 1.7 % (ref 0.0–14.0)
NEUT%: 76.7 % (ref 38.4–76.8)
NEUTROS ABS: 2.7 10*3/uL (ref 1.5–6.5)
PLATELETS: 277 10*3/uL (ref 145–400)
RBC: 4.16 10*6/uL (ref 3.70–5.45)
RDW: 14.4 % (ref 11.2–14.5)
WBC: 3.5 10*3/uL — AB (ref 3.9–10.3)

## 2015-03-06 LAB — COMPREHENSIVE METABOLIC PANEL (CC13)
ALT: 30 U/L (ref 0–55)
ANION GAP: 10 meq/L (ref 3–11)
AST: 38 U/L — AB (ref 5–34)
Albumin: 3.4 g/dL — ABNORMAL LOW (ref 3.5–5.0)
Alkaline Phosphatase: 197 U/L — ABNORMAL HIGH (ref 40–150)
BILIRUBIN TOTAL: 0.74 mg/dL (ref 0.20–1.20)
BUN: 22.7 mg/dL (ref 7.0–26.0)
CALCIUM: 10.1 mg/dL (ref 8.4–10.4)
CHLORIDE: 103 meq/L (ref 98–109)
CO2: 24 meq/L (ref 22–29)
CREATININE: 1.1 mg/dL (ref 0.6–1.1)
EGFR: 57 mL/min/{1.73_m2} — ABNORMAL LOW (ref >90)
Glucose: 146 mg/dl — ABNORMAL HIGH (ref 70–140)
Potassium: 4.6 mEq/L (ref 3.5–5.1)
Sodium: 138 mEq/L (ref 136–145)
TOTAL PROTEIN: 7.6 g/dL (ref 6.4–8.3)

## 2015-03-06 MED ORDER — SODIUM CHLORIDE 0.9 % IV SOLN
Freq: Once | INTRAVENOUS | Status: AC
  Administered 2015-03-06: 12:00:00 via INTRAVENOUS
  Filled 2015-03-06: qty 4

## 2015-03-06 MED ORDER — LORAZEPAM 0.5 MG PO TABS: 0.5000 mg | ORAL_TABLET | Freq: Four times a day (QID) | ORAL | Status: DC | PRN

## 2015-03-06 MED ORDER — SODIUM CHLORIDE 0.9 % IV SOLN
INTRAVENOUS | Status: AC
  Administered 2015-03-06: 12:00:00 via INTRAVENOUS

## 2015-03-06 NOTE — Patient Instructions (Signed)

## 2015-03-06 NOTE — Telephone Encounter (Signed)
Pt states the medicine she got yesterday " has helped some, still nauseated, very weak and dizzy, not drinking much fluid". Feels she is dehydrated.  Pt to come in at 10:30 for labs and then see Selena Lesser, NP at 11:00

## 2015-03-07 ENCOUNTER — Encounter: Payer: Self-pay | Admitting: Nurse Practitioner

## 2015-03-07 DIAGNOSIS — R110 Nausea: Secondary | ICD-10-CM | POA: Insufficient documentation

## 2015-03-07 DIAGNOSIS — E860 Dehydration: Secondary | ICD-10-CM | POA: Insufficient documentation

## 2015-03-07 NOTE — Assessment & Plan Note (Signed)
Patient received cycle 2 of her FOLFOX chemotherapy on 02/28/2015.  Since receiving her last cycle of chemotherapy.  Patient has been experiencing some chronic nausea; but no vomiting.  She denies any diarrhea or constipation issues.  She has had minimal appetite and poor oral intake.  She feels dehydrated today.  Patient will receive IV fluid rehydration while at the cancer Center today.  Blood counts obtained today revealed a WBC of 3.5, ANC 2.7, hemoglobin 11.8, and platelet count of 277.  Patient has plans to return for labs, visit, and chemotherapy on 03/14/2015.

## 2015-03-07 NOTE — Progress Notes (Signed)
SYMPTOM MANAGEMENT CLINIC   HPI: Brianna Vasquez 54 y.o. female diagnosed with ovarian cancer.  Currently undergoing FOLFOX chemotherapy regimen.  Patient received cycle 2 of her FOLFOX chemotherapy on 02/28/2015.  Since receiving her last cycle of chemotherapy.  Patient has been experiencing some chronic nausea; but no vomiting.  She denies any diarrhea or constipation issues.  She has had minimal appetite and poor oral intake.  She feels dehydrated today.   Patient has been taking Zofran and Compazine at home with minimal effectiveness.  She was called in dexamethasone 8 mg twice daily to try for the next few days as well.  Patient states that dexamethasone has not helped relieve her nausea.  Patient denies any recent fevers or chills.   HPI  ROS  Past Medical History  Diagnosis Date  . Wears glasses   . Diverticulosis     dx 09/2014  . Esophagus hernia     hx of  . GERD (gastroesophageal reflux disease)     once in a while  . Anemia     hx of, none since hysterectomy  . Headache     severe, for last few mornings  . Cancer     "ovary tumor-on colon, kidney, spots on liver and lungs"  . Anxiety     recent, situational    Past Surgical History  Procedure Laterality Date  . Wisdom tooth extraction  ~2001  . Cholecystectomy  2007    lap choli  . Breast lumpectomy Right 10/2013  . Vaginal hysterectomy  2005  . Colonoscopy  2016  . Laparotomy N/A 01/16/2015    Procedure: EXPLORATORY LAPAROTOMY ;  Surgeon: Cleda Mccreedy, MD;  Location: [facility];  Service: Gynecology;  Laterality: N/A;  . Oophorectomy Bilateral 01/16/2015    Procedure: BILATERAL OOPHORECTOMY RESECTION OF PELVIC MASS ;  Surgeon: Cleda Mccreedy, MD;  Location: [facility];  Service: Gynecology;  Laterality: Bilateral;  . Cystoscopy w/ ureteral stent placement Left 01/16/2015    Procedure: CYSTOSCOPY WITH RETROGRADE PYELOGRAM/URETERAL STENT PLACEMENT;  Surgeon: Jethro Bolus, MD;  Location: [facility];  Service: Urology;   Laterality: Left;  . Porta cath insertion    . Colonoscopy with propofol N/A 02/09/2015    Procedure: COLONOSCOPY WITH PROPOFOL;  Surgeon: Bernette Redbird, MD;  Location: [facility];  Service: Endoscopy;  Laterality: N/A;    has Atypical ductal hyperplasia of right breast; Pelvic mass in female; Ovarian mass; Ovarian cancer on left; Family history of breast cancer in female; Family history of pancreatic cancer; Family history of colon cancer; Dehydration; and Nausea without vomiting on her problem list.    is allergic to other and darvon.    Medication List       This list is accurate as of: 03/06/15 11:59 PM.  Always use your most recent med list.               calcium carbonate 500 MG chewable tablet  Commonly known as:  TUMS - dosed in mg elemental calcium  Chew 1-2 tablets by mouth 3 (three) times daily as needed for indigestion or heartburn.     dexamethasone 4 MG tablet  Commonly known as:  DECADRON  TAKE TWO TABS TWICE A DAY FOR TWO DAYS     gabapentin 300 MG capsule  Commonly known as:  NEURONTIN  Take 2 capsules (600 mg total) by mouth 3 (three) times daily.     lidocaine-prilocaine cream  Commonly known as:  EMLA  Apply to port site one  hour prior to use. Do not rub in. Cover with plastic.     LORazepam 0.5 MG tablet  Commonly known as:  ATIVAN  Take 1 tablet (0.5 mg total) by mouth every 6 (six) hours as needed for anxiety (nausea).     naproxen sodium 220 MG tablet  Commonly known as:  ANAPROX  Take 440 mg by mouth 3 (three) times daily.     ondansetron 8 MG tablet  Commonly known as:  ZOFRAN  Take 1 tablet (8 mg total) by mouth every 8 (eight) hours as needed for nausea or vomiting.     oxyCODONE 5 MG immediate release tablet  Commonly known as:  Oxy IR/ROXICODONE  Take 1-2 tabs every 4 hours as needed for pain     prochlorperazine 10 MG tablet  Commonly known as:  COMPAZINE  Take 1 tablet (10 mg total) by mouth every 6 (six) hours as needed for nausea  or vomiting.     sucralfate 1 G tablet  Commonly known as:  CARAFATE  Take 1 g by mouth 3 (three) times daily before meals.         PHYSICAL EXAMINATION  Oncology Vitals 03/06/2015 03/06/2015 03/02/2015 02/28/2015 02/16/2015 02/14/2015 02/09/2015  Height - 165 cm - 165 cm - - -  Weight - 73.074 kg - 77.474 kg - - -  Weight (lbs) - 161 lbs 2 oz - 170 lbs 13 oz - - -  BMI (kg/m2) - 26.81 kg/m2 - 28.42 kg/m2 - - -  Temp - 98.2 97.3 97.7 98.1 98.4 -  Pulse 85 123 78 91 85 90 86  Resp $Rem'19 18 15 17 16 16 11  'EDXo$ SpO2 100 100 99 99 100 97 98  BSA (m2) - 1.83 m2 - 1.89 m2 - - -   BP Readings from Last 3 Encounters:  03/06/15 140/65  03/02/15 138/62  02/28/15 142/75    Physical Exam  Constitutional: She is oriented to person, place, and time.  Patient appears fatigued, slightly weak, frail, and chronically ill.  HENT:  Head: Normocephalic and atraumatic.  Mouth/Throat: Oropharynx is clear and moist.  Eyes: Conjunctivae and EOM are normal. Pupils are equal, round, and reactive to light. Right eye exhibits no discharge. Left eye exhibits no discharge. No scleral icterus.  Neck: Normal range of motion. Neck supple. No JVD present. No tracheal deviation present. No thyromegaly present.  Cardiovascular: Normal rate, regular rhythm, normal heart sounds and intact distal pulses.   Patient was initially tachycardic with a heart rate of 123; but her heart rate decreased down to 85 after receiving IV fluid rehydration.  Pulmonary/Chest: Effort normal and breath sounds normal. No respiratory distress. She has no wheezes. She has no rales. She exhibits no tenderness.  Abdominal: Soft. Bowel sounds are normal. She exhibits no distension and no mass. There is no tenderness. There is no rebound and no guarding.  Musculoskeletal: Normal range of motion. She exhibits no edema or tenderness.  Lymphadenopathy:    She has no cervical adenopathy.  Neurological: She is alert and oriented to person, place, and time.  Gait normal.  Skin: Skin is warm and dry. No rash noted. No erythema. There is pallor.  Psychiatric: Affect normal.  Nursing note and vitals reviewed.   LABORATORY DATA:. Appointment on 03/06/2015  Component Date Value Ref Range Status  . WBC 03/06/2015 3.5* 3.9 - 10.3 10e3/uL Final  . NEUT# 03/06/2015 2.7  1.5 - 6.5 10e3/uL Final  . HGB 03/06/2015 11.8  11.6 -  15.9 g/dL Final  . HCT 03/06/2015 36.0  34.8 - 46.6 % Final  . Platelets 03/06/2015 277  145 - 400 10e3/uL Final  . MCV 03/06/2015 86.7  79.5 - 101.0 fL Final  . MCH 03/06/2015 28.4  25.1 - 34.0 pg Final  . MCHC 03/06/2015 32.8  31.5 - 36.0 g/dL Final  . RBC 03/06/2015 4.16  3.70 - 5.45 10e6/uL Final  . RDW 03/06/2015 14.4  11.2 - 14.5 % Final  . lymph# 03/06/2015 0.8* 0.9 - 3.3 10e3/uL Final  . MONO# 03/06/2015 0.1  0.1 - 0.9 10e3/uL Final  . Eosinophils Absolute 03/06/2015 0.0  0.0 - 0.5 10e3/uL Final  . Basophils Absolute 03/06/2015 0.0  0.0 - 0.1 10e3/uL Final  . NEUT% 03/06/2015 76.7  38.4 - 76.8 % Final  . LYMPH% 03/06/2015 21.4  14.0 - 49.7 % Final  . MONO% 03/06/2015 1.7  0.0 - 14.0 % Final  . EOS% 03/06/2015 0.1  0.0 - 7.0 % Final  . BASO% 03/06/2015 0.1  0.0 - 2.0 % Final  . Sodium 03/06/2015 138  136 - 145 mEq/L Final  . Potassium 03/06/2015 4.6  3.5 - 5.1 mEq/L Final  . Chloride 03/06/2015 103  98 - 109 mEq/L Final  . CO2 03/06/2015 24  22 - 29 mEq/L Final  . Glucose 03/06/2015 146* 70 - 140 mg/dl Final   Glucose reference range is for nonfasting patients. Fasting glucose reference range is 70- 100.  Marland Kitchen BUN 03/06/2015 22.7  7.0 - 26.0 mg/dL Final  . Creatinine 03/06/2015 1.1  0.6 - 1.1 mg/dL Final  . Total Bilirubin 03/06/2015 0.74  0.20 - 1.20 mg/dL Final  . Alkaline Phosphatase 03/06/2015 197* 40 - 150 U/L Final  . AST 03/06/2015 38* 5 - 34 U/L Final  . ALT 03/06/2015 30  0 - 55 U/L Final  . Total Protein 03/06/2015 7.6  6.4 - 8.3 g/dL Final  . Albumin 03/06/2015 3.4* 3.5 - 5.0 g/dL Final  . Calcium  03/06/2015 10.1  8.4 - 10.4 mg/dL Final  . Anion Gap 03/06/2015 10  3 - 11 mEq/L Final  . EGFR 03/06/2015 57* >90 ml/min/1.73 m2 Final   eGFR is calculated using the CKD-EPI Creatinine Equation (2009)     RADIOGRAPHIC STUDIES: No results found.  ASSESSMENT/PLAN:    Ovarian cancer on left Patient received cycle 2 of her FOLFOX chemotherapy on 02/28/2015.  Since receiving her last cycle of chemotherapy.  Patient has been experiencing some chronic nausea; but no vomiting.  She denies any diarrhea or constipation issues.  She has had minimal appetite and poor oral intake.  She feels dehydrated today.  Patient will receive IV fluid rehydration while at the cancer Center today.  Blood counts obtained today revealed a WBC of 3.5, ANC 2.7, hemoglobin 11.8, and platelet count of 277.  Patient has plans to return for labs, visit, and chemotherapy on 03/14/2015.  Nausea without vomiting Patient received cycle 2 of her FOLFOX chemotherapy on 02/28/2015.  Since receiving her last cycle of chemotherapy.  Patient has been experiencing some chronic nausea; but no vomiting.  She denies any diarrhea or constipation issues.  She has had minimal appetite and poor oral intake.  She feels dehydrated today.   Patient has been taking Zofran and Compazine at home with minimal effectiveness.  She was called in dexamethasone 8 mg twice daily to try for the next few days as well.  Patient states that dexamethasone has not helped relieve her nausea.  Advised patient  she should discontinue her Xanax that she only uses on an occasional basis for anxiety; and will try Ativan for treatment of her nausea to see if that helps.  Advised patient she should also take the Ativan only occasionally for anxiety as needed.  Advised both patient and her family member that the Ativan is considered to narcotic; and can increase the sedation effect if taken with her pain medications.  Patient will receive IV fluid rehydration while at  the cancer Center today.  She was also encouraged to push fluids at home is much as possible.     Dehydration Patient received cycle 2 of her FOLFOX chemotherapy on 02/28/2015.  Since receiving her last cycle of chemotherapy.  Patient has been experiencing some chronic nausea; but no vomiting.  She denies any diarrhea or constipation issues.  She has had minimal appetite and poor oral intake.  She feels dehydrated today.   Patient has been taking Zofran and Compazine at home with minimal effectiveness.  She was called in dexamethasone 8 mg twice daily to try for the next few days as well.  Patient states that dexamethasone has not helped relieve her nausea.  Advised patient she should discontinue her Xanax that she only uses on an occasional basis for anxiety; and will try Ativan for treatment of her nausea to see if that helps.  Advised patient she should also take the Ativan only occasionally for anxiety as needed.  Advised both patient and her family member that the Ativan is considered to narcotic; and can increase the sedation effect if taken with her pain medications.  Patient will receive IV fluid rehydration while at the cancer Center today.  She was also encouraged to push fluids at home is much as possible.     Patient stated understanding of all instructions; and was in agreement with this plan of care. The patient knows to call the clinic with any problems, questions or concerns.   Review/collaboration with Dr. Benay Spice regarding all aspects of patient's visit today.   Total time spent with patient was 25 minutes;  with greater than 75 percent of that time spent in face to face counseling regarding patient's symptoms,  and coordination of care and follow up.  Disclaimer:This dictation was prepared with Dragon/digital dictation along with [employer]. Any transcriptional errors that result from this process are unintentional.  Drue Second, NP 03/07/2015

## 2015-03-07 NOTE — Assessment & Plan Note (Signed)
Patient received cycle 2 of her FOLFOX chemotherapy on 02/28/2015.  Since receiving her last cycle of chemotherapy.  Patient has been experiencing some chronic nausea; but no vomiting.  She denies any diarrhea or constipation issues.  She has had minimal appetite and poor oral intake.  She feels dehydrated today.   Patient has been taking Zofran and Compazine at home with minimal effectiveness.  She was called in dexamethasone 8 mg twice daily to try for the next few days as well.  Patient states that dexamethasone has not helped relieve her nausea.  Advised patient she should discontinue her Xanax that she only uses on an occasional basis for anxiety; and will try Ativan for treatment of her nausea to see if that helps.  Advised patient she should also take the Ativan only occasionally for anxiety as needed.  Advised both patient and her family member that the Ativan is considered to narcotic; and can increase the sedation effect if taken with her pain medications.  Patient will receive IV fluid rehydration while at the cancer Center today.  She was also encouraged to push fluids at home is much as possible.

## 2015-03-09 ENCOUNTER — Telehealth: Payer: Self-pay | Admitting: *Deleted

## 2015-03-09 ENCOUNTER — Other Ambulatory Visit: Payer: Self-pay | Admitting: *Deleted

## 2015-03-09 DIAGNOSIS — C562 Malignant neoplasm of left ovary: Secondary | ICD-10-CM

## 2015-03-09 MED ORDER — SODIUM CHLORIDE 0.9 % IV SOLN: Freq: Once | INTRAVENOUS | Status: DC

## 2015-03-09 NOTE — Telephone Encounter (Signed)
Rosalio Macadamia RN will look at adding fluids to Pump D/C  Next Friday 03/16/15

## 2015-03-09 NOTE — Telephone Encounter (Signed)
Spoke with patient.  She states she had a "great day" yesterday.  She ate well and drank 64 oz of fluids.  Today she is doing well but noticed that she is a little dizzy which she thinks may be due to the ativan, so she is only going to take one half a tablet. Talked with her about keeping up her fluid intake over the weekend.  She knows to call us if she is not continuing to do well.  She thinks the IV fluids really helped her and would like to request that next Friday when she gets her pump off, she would also like to get IVF.  Will discuss with Selena Lesser NP.

## 2015-03-11 ENCOUNTER — Other Ambulatory Visit: Payer: Self-pay | Admitting: Oncology

## 2015-03-14 ENCOUNTER — Telehealth: Payer: Self-pay | Admitting: Oncology

## 2015-03-14 ENCOUNTER — Ambulatory Visit (HOSPITAL_BASED_OUTPATIENT_CLINIC_OR_DEPARTMENT_OTHER): Payer: BLUE CROSS/BLUE SHIELD

## 2015-03-14 ENCOUNTER — Other Ambulatory Visit (HOSPITAL_BASED_OUTPATIENT_CLINIC_OR_DEPARTMENT_OTHER): Payer: BLUE CROSS/BLUE SHIELD

## 2015-03-14 ENCOUNTER — Other Ambulatory Visit: Payer: BLUE CROSS/BLUE SHIELD

## 2015-03-14 ENCOUNTER — Ambulatory Visit (HOSPITAL_BASED_OUTPATIENT_CLINIC_OR_DEPARTMENT_OTHER): Payer: BLUE CROSS/BLUE SHIELD | Admitting: Oncology

## 2015-03-14 ENCOUNTER — Other Ambulatory Visit: Payer: Self-pay | Admitting: *Deleted

## 2015-03-14 ENCOUNTER — Telehealth: Payer: Self-pay | Admitting: *Deleted

## 2015-03-14 VITALS — BP 133/72 | HR 125 | Temp 98.6°F | Resp 18 | Ht 65.0 in | Wt 160.4 lb

## 2015-03-14 DIAGNOSIS — R300 Dysuria: Secondary | ICD-10-CM

## 2015-03-14 DIAGNOSIS — C5702 Malignant neoplasm of left fallopian tube: Secondary | ICD-10-CM | POA: Diagnosis not present

## 2015-03-14 DIAGNOSIS — C787 Secondary malignant neoplasm of liver and intrahepatic bile duct: Secondary | ICD-10-CM

## 2015-03-14 DIAGNOSIS — R110 Nausea: Secondary | ICD-10-CM

## 2015-03-14 DIAGNOSIS — C7800 Secondary malignant neoplasm of unspecified lung: Secondary | ICD-10-CM

## 2015-03-14 DIAGNOSIS — C562 Malignant neoplasm of left ovary: Secondary | ICD-10-CM

## 2015-03-14 DIAGNOSIS — N3289 Other specified disorders of bladder: Secondary | ICD-10-CM

## 2015-03-14 DIAGNOSIS — C7951 Secondary malignant neoplasm of bone: Secondary | ICD-10-CM

## 2015-03-14 DIAGNOSIS — C801 Malignant (primary) neoplasm, unspecified: Secondary | ICD-10-CM

## 2015-03-14 DIAGNOSIS — D701 Agranulocytosis secondary to cancer chemotherapy: Secondary | ICD-10-CM

## 2015-03-14 LAB — COMPREHENSIVE METABOLIC PANEL (CC13)
ALT: 24 U/L (ref 0–55)
AST: 35 U/L — AB (ref 5–34)
Albumin: 3.2 g/dL — ABNORMAL LOW (ref 3.5–5.0)
Alkaline Phosphatase: 203 U/L — ABNORMAL HIGH (ref 40–150)
Anion Gap: 5 mEq/L (ref 3–11)
BUN: 6.2 mg/dL — AB (ref 7.0–26.0)
CALCIUM: 9.5 mg/dL (ref 8.4–10.4)
CHLORIDE: 109 meq/L (ref 98–109)
CO2: 25 meq/L (ref 22–29)
CREATININE: 1 mg/dL (ref 0.6–1.1)
EGFR: 68 mL/min/{1.73_m2} — ABNORMAL LOW (ref >90)
Glucose: 120 mg/dl (ref 70–140)
POTASSIUM: 3.9 meq/L (ref 3.5–5.1)
SODIUM: 140 meq/L (ref 136–145)
Total Bilirubin: 1.1 mg/dL (ref 0.20–1.20)
Total Protein: 7 g/dL (ref 6.4–8.3)

## 2015-03-14 LAB — CBC WITH DIFFERENTIAL/PLATELET
BASO%: 3.8 % — AB (ref 0.0–2.0)
Basophils Absolute: 0.1 10*3/uL (ref 0.0–0.1)
EOS%: 7.7 % — AB (ref 0.0–7.0)
Eosinophils Absolute: 0.2 10*3/uL (ref 0.0–0.5)
HEMATOCRIT: 34.2 % — AB (ref 34.8–46.6)
HGB: 11.2 g/dL — ABNORMAL LOW (ref 11.6–15.9)
LYMPH%: 48.5 % (ref 14.0–49.7)
MCH: 29.2 pg (ref 25.1–34.0)
MCHC: 32.7 g/dL (ref 31.5–36.0)
MCV: 89.1 fL (ref 79.5–101.0)
MONO#: 0.7 10*3/uL (ref 0.1–0.9)
MONO%: 31.5 % — AB (ref 0.0–14.0)
NEUT%: 8.5 % — AB (ref 38.4–76.8)
NEUTROS ABS: 0.2 10*3/uL — AB (ref 1.5–6.5)
Platelets: 166 10*3/uL (ref 145–400)
RBC: 3.84 10*6/uL (ref 3.70–5.45)
RDW: 16 % — ABNORMAL HIGH (ref 11.2–14.5)
WBC: 2.4 10*3/uL — AB (ref 3.9–10.3)
lymph#: 1.1 10*3/uL (ref 0.9–3.3)
nRBC: 0 % (ref 0–0)

## 2015-03-14 LAB — CEA: CEA: 83.8 ng/mL — AB (ref 0.0–5.0)

## 2015-03-14 MED ORDER — SODIUM CHLORIDE 0.9 % IV SOLN
Freq: Once | INTRAVENOUS | Status: AC
  Administered 2015-03-14: 11:00:00 via INTRAVENOUS
  Filled 2015-03-14: qty 4

## 2015-03-14 MED ORDER — CIPROFLOXACIN HCL 500 MG PO TABS: 500.0000 mg | ORAL_TABLET | Freq: Two times a day (BID) | ORAL | Status: DC

## 2015-03-14 MED ORDER — SODIUM CHLORIDE 0.9 % IV SOLN
INTRAVENOUS | Status: DC
  Administered 2015-03-14: 11:00:00 via INTRAVENOUS

## 2015-03-14 NOTE — Patient Instructions (Signed)

## 2015-03-14 NOTE — Telephone Encounter (Signed)
Per staff message and POF I have scheduled appts. Advised scheduler of appts. JMW  

## 2015-03-14 NOTE — Telephone Encounter (Signed)
per pof to sch pt appt-sent MW email to sch pt trmt -pt to get copy of updated sch b4 leaving

## 2015-03-14 NOTE — Progress Notes (Signed)
  Brianna Vasquez   Diagnosis: Ovarian cancer  INTERVAL HISTORY:   Brianna Vasquez completed a second cycle of FOLFOX on 02/28/2015. She developed nausea following chemotherapy was evaluated in the symptom management clinic on 03/07/2015. Decadron did not relieve the nausea. She received IV fluids. She reports the nausea resolved until this morning. The left pelvic and "hip "pain is much improved. She is not or taking gabapentin or naproxen. She takes oxycodone as needed. She complains of constipation. Ativan caused confusion. She has left pelvic pain with urination and wonders whether this is related to the urinary stent.  Objective:  Vital signs in last 24 hours:  Blood pressure 133/72, pulse 125, temperature 98.6 F (37 C), temperature source Oral, resp. rate 18, height _0  (1.651 m), weight 160 lb 6.4 oz (72.757 kg), SpO2 100 %.    HEENT: No thrush or ulcers Resp: Lungs clear bilaterally Cardio: Regular rate and rhythm GI: Fullness in the left lower abdomen without tenderness. No hepatomegaly Vascular: No leg edema Musculoskeletal: No pain with motion at the left hip    Portacath/PICC-without erythema  Lab Results:  Lab Results  Component Value Date   WBC 2.4* 03/14/2015   HGB 11.2* 03/14/2015   HCT 34.2* 03/14/2015   MCV 89.1 03/14/2015   PLT 166 03/14/2015   NEUTROABS 0.2* 03/14/2015     Lab Results  Component Value Date   CEA 41.3* 02/14/2015     Medications: I have reviewed the patient's current medications.  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 Metastatic  left periaortic adenopathy.  PET scan 02/06/2015 with extensive hypermetabolic lymphadenopathy in the chest, abdomen, and pelvis, Residual left pelvic tumor, sigmoid colon lesion, liver/lung metastases, and a left acetabulum metastasis  Colonoscopy 02/09/2015 with a stricture at 30 cm, biopsy positive for adenocarcinoma, no intrinsic colon mass noted  Cycle 1 FOLFOX 02/14/2015  Cycle 2 FOLFOX 02/28/2015  2. Placement of a left ureter stent 01/16/2015  3. Family history of multiple cancers, report of a paternal cousin-BRCA2 positive  4. Upper endoscopy 01/29/2015 with a localized thick gastric fold with a small ulcer  5. Mild swelling of the left foot and ankle 01/27/2015  6. Pain secondary to the left pelvic tumor and acetabulum metastasis  7. Acute/delayed nausea following  FOLFOX-Aloxi and amend were added with cycle 2, plan to add prophylactic Decadron will cycle 3  8. Acute transient loss of bladder control with cycle 1 FOLFOX  9.   Severe neutropenia secondary to chemotherapy-she will begin prophylactic ciprofloxacin     Disposition:  Brianna Vasquez has completed 2 cycles of FOLFOX. The chemotherapy has been contacted by delayed nausea and she now has severe neutropenia. She will contact us for a fever. She will begin prophylactic ciprofloxacin and return for a CBC on 03/16/2015.  Neulasta will be added with cycle 3 FOLFOX. I reviewed the potential toxicities associated with Neulasta and she agrees to proceed.  Brianna Vasquez will return for an office visit and chemotherapy on 03/20/2015.    Betsy Coder, MD  03/14/2015  1:29 PM

## 2015-03-16 ENCOUNTER — Ambulatory Visit (HOSPITAL_BASED_OUTPATIENT_CLINIC_OR_DEPARTMENT_OTHER): Payer: BLUE CROSS/BLUE SHIELD | Admitting: Nurse Practitioner

## 2015-03-16 ENCOUNTER — Other Ambulatory Visit (HOSPITAL_BASED_OUTPATIENT_CLINIC_OR_DEPARTMENT_OTHER): Payer: BLUE CROSS/BLUE SHIELD

## 2015-03-16 ENCOUNTER — Emergency Department (HOSPITAL_COMMUNITY): Payer: BLUE CROSS/BLUE SHIELD

## 2015-03-16 ENCOUNTER — Emergency Department (HOSPITAL_COMMUNITY)
Admission: EM | Admit: 2015-03-16 | Discharge: 2015-03-16 | Disposition: A | Discharge status: Home / Self Care | Payer: BLUE CROSS/BLUE SHIELD | Attending: Emergency Medicine | Admitting: Emergency Medicine

## 2015-03-16 ENCOUNTER — Encounter (HOSPITAL_COMMUNITY): Payer: Self-pay | Admitting: Emergency Medicine

## 2015-03-16 ENCOUNTER — Other Ambulatory Visit: Payer: BLUE CROSS/BLUE SHIELD

## 2015-03-16 ENCOUNTER — Ambulatory Visit (HOSPITAL_COMMUNITY)
Admission: RE | Admit: 2015-03-16 | Discharge: 2015-03-16 | Disposition: A | Discharge status: Home / Self Care | Payer: BLUE CROSS/BLUE SHIELD | Source: Ambulatory Visit | Attending: Nurse Practitioner | Admitting: Nurse Practitioner

## 2015-03-16 ENCOUNTER — Ambulatory Visit: Payer: BLUE CROSS/BLUE SHIELD

## 2015-03-16 DIAGNOSIS — Z79899 Other long term (current) drug therapy: Secondary | ICD-10-CM | POA: Insufficient documentation

## 2015-03-16 DIAGNOSIS — Z90710 Acquired absence of both cervix and uterus: Secondary | ICD-10-CM | POA: Insufficient documentation

## 2015-03-16 DIAGNOSIS — C787 Secondary malignant neoplasm of liver and intrahepatic bile duct: Secondary | ICD-10-CM

## 2015-03-16 DIAGNOSIS — C7951 Secondary malignant neoplasm of bone: Secondary | ICD-10-CM

## 2015-03-16 DIAGNOSIS — Z862 Personal history of diseases of the blood and blood-forming organs and certain disorders involving the immune mechanism: Secondary | ICD-10-CM | POA: Insufficient documentation

## 2015-03-16 DIAGNOSIS — R109 Unspecified abdominal pain: Secondary | ICD-10-CM | POA: Diagnosis present

## 2015-03-16 DIAGNOSIS — C562 Malignant neoplasm of left ovary: Secondary | ICD-10-CM

## 2015-03-16 DIAGNOSIS — D708 Other neutropenia: Secondary | ICD-10-CM | POA: Diagnosis not present

## 2015-03-16 DIAGNOSIS — M545 Low back pain: Secondary | ICD-10-CM | POA: Diagnosis not present

## 2015-03-16 DIAGNOSIS — T451X5A Adverse effect of antineoplastic and immunosuppressive drugs, initial encounter: Secondary | ICD-10-CM

## 2015-03-16 DIAGNOSIS — Z8543 Personal history of malignant neoplasm of ovary: Secondary | ICD-10-CM | POA: Diagnosis not present

## 2015-03-16 DIAGNOSIS — Z85528 Personal history of other malignant neoplasm of kidney: Secondary | ICD-10-CM | POA: Diagnosis not present

## 2015-03-16 DIAGNOSIS — N390 Urinary tract infection, site not specified: Secondary | ICD-10-CM | POA: Diagnosis not present

## 2015-03-16 DIAGNOSIS — D701 Agranulocytosis secondary to cancer chemotherapy: Secondary | ICD-10-CM

## 2015-03-16 DIAGNOSIS — F419 Anxiety disorder, unspecified: Secondary | ICD-10-CM | POA: Insufficient documentation

## 2015-03-16 DIAGNOSIS — Z87891 Personal history of nicotine dependence: Secondary | ICD-10-CM | POA: Diagnosis not present

## 2015-03-16 DIAGNOSIS — R5383 Other fatigue: Secondary | ICD-10-CM | POA: Insufficient documentation

## 2015-03-16 DIAGNOSIS — K219 Gastro-esophageal reflux disease without esophagitis: Secondary | ICD-10-CM | POA: Diagnosis not present

## 2015-03-16 DIAGNOSIS — K5732 Diverticulitis of large intestine without perforation or abscess without bleeding: Secondary | ICD-10-CM

## 2015-03-16 DIAGNOSIS — G893 Neoplasm related pain (acute) (chronic): Secondary | ICD-10-CM

## 2015-03-16 DIAGNOSIS — R319 Hematuria, unspecified: Principal | ICD-10-CM

## 2015-03-16 LAB — CBC WITH DIFFERENTIAL/PLATELET
BASO%: 3.2 % — ABNORMAL HIGH (ref 0.0–2.0)
BASOS ABS: 0.1 10*3/uL (ref 0.0–0.1)
EOS%: 7.4 % — AB (ref 0.0–7.0)
Eosinophils Absolute: 0.3 10*3/uL (ref 0.0–0.5)
HEMATOCRIT: 34.2 % — AB (ref 34.8–46.6)
HEMOGLOBIN: 11.1 g/dL — AB (ref 11.6–15.9)
LYMPH#: 1.6 10*3/uL (ref 0.9–3.3)
LYMPH%: 46.8 % (ref 14.0–49.7)
MCH: 28.7 pg (ref 25.1–34.0)
MCHC: 32.6 g/dL (ref 31.5–36.0)
MCV: 88.2 fL (ref 79.5–101.0)
MONO#: 1.2 10*3/uL — AB (ref 0.1–0.9)
MONO%: 34.4 % — ABNORMAL HIGH (ref 0.0–14.0)
NEUT#: 0.3 10*3/uL — CL (ref 1.5–6.5)
NEUT%: 8.2 % — ABNORMAL LOW (ref 38.4–76.8)
PLATELETS: 217 10*3/uL (ref 145–400)
RBC: 3.88 10*6/uL (ref 3.70–5.45)
RDW: 16.1 % — AB (ref 11.2–14.5)
WBC: 3.4 10*3/uL — ABNORMAL LOW (ref 3.9–10.3)

## 2015-03-16 LAB — URINALYSIS, ROUTINE W REFLEX MICROSCOPIC
GLUCOSE, UA: NEGATIVE mg/dL
KETONES UR: 15 mg/dL — AB
NITRITE: NEGATIVE
PH: 5.5 (ref 5.0–8.0)
Protein, ur: 100 mg/dL — AB
SPECIFIC GRAVITY, URINE: 1.027 (ref 1.005–1.030)
Urobilinogen, UA: 1 mg/dL (ref 0.0–1.0)

## 2015-03-16 LAB — COMPREHENSIVE METABOLIC PANEL (CC13)
ALBUMIN: 3 g/dL — AB (ref 3.5–5.0)
ALK PHOS: 188 U/L — AB (ref 40–150)
ALT: 17 U/L (ref 0–55)
ANION GAP: 7 meq/L (ref 3–11)
AST: 28 U/L (ref 5–34)
BUN: 6.5 mg/dL — AB (ref 7.0–26.0)
CALCIUM: 9.2 mg/dL (ref 8.4–10.4)
CO2: 25 mEq/L (ref 22–29)
CREATININE: 0.9 mg/dL (ref 0.6–1.1)
Chloride: 107 mEq/L (ref 98–109)
EGFR: 76 mL/min/{1.73_m2} — ABNORMAL LOW (ref >90)
Glucose: 102 mg/dl (ref 70–140)
POTASSIUM: 3.6 meq/L (ref 3.5–5.1)
Sodium: 140 mEq/L (ref 136–145)
Total Bilirubin: 0.77 mg/dL (ref 0.20–1.20)
Total Protein: 6.8 g/dL (ref 6.4–8.3)

## 2015-03-16 LAB — URINE MICROSCOPIC-ADD ON

## 2015-03-16 MED ORDER — CIPROFLOXACIN HCL 500 MG PO TABS: 500.0000 mg | ORAL_TABLET | Freq: Two times a day (BID) | ORAL | Status: AC

## 2015-03-16 MED ORDER — IOHEXOL 300 MG/ML  SOLN
100.0000 mL | Freq: Once | INTRAMUSCULAR | Status: AC | PRN
  Administered 2015-03-16: 100 mL via INTRAVENOUS

## 2015-03-16 MED ORDER — HYDROMORPHONE HCL 1 MG/ML IJ SOLN
1.0000 mg | Freq: Once | INTRAMUSCULAR | Status: AC
  Administered 2015-03-16: 1 mg via INTRAVENOUS
  Filled 2015-03-16: qty 1

## 2015-03-16 MED ORDER — ONDANSETRON 4 MG PO TBDP: 4.0000 mg | ORAL_TABLET | Freq: Three times a day (TID) | ORAL | Status: AC | PRN

## 2015-03-16 MED ORDER — SODIUM CHLORIDE 0.9 % IV BOLUS (SEPSIS)
1000.0000 mL | Freq: Once | INTRAVENOUS | Status: AC
Start: 2015-03-16 — End: 2015-03-16
  Administered 2015-03-16: 1000 mL via INTRAVENOUS

## 2015-03-16 MED ORDER — METRONIDAZOLE 500 MG PO TABS: 500.0000 mg | ORAL_TABLET | Freq: Two times a day (BID) | ORAL | Status: DC

## 2015-03-16 MED ORDER — ONDANSETRON HCL 4 MG/2ML IJ SOLN
4.0000 mg | Freq: Once | INTRAMUSCULAR | Status: AC
  Administered 2015-03-16: 4 mg via INTRAVENOUS
  Filled 2015-03-16: qty 2

## 2015-03-16 NOTE — Progress Notes (Signed)
Pt arrived to infusion area c/o sudden onset right lower back pain radiating down right lateral thigh. Unreleived with 2 oxycodone 5 mg tabs taken. @ 10am.  Dr. Benay Spice made aware. Selena Lesser, NP with [facility] to evaluate pt.  Selena Lesser, N{P has been in to see pt and after discussion with Dr. Benay Spice, decision made for pt to get CT scan of abdomen and pelvis today @ 3pm @ [place]/Radiology.

## 2015-03-16 NOTE — ED Notes (Signed)
Pt presents for right flank pain, radiating to right lower back and down right leg, starting today. Denies new nausea, or vomiting. Reports dysuria and urine color change.

## 2015-03-16 NOTE — Addendum Note (Signed)
Addendum  created 03/16/15 0910 by Oleta Mouse, MD   Modules edited: Anesthesia Responsible Staff

## 2015-03-16 NOTE — ED Notes (Signed)
Pt is resting comfortably in bed with friend at the bedside.  She states that the pain medication was effective and that she is currently in no pain.  She is anxious to be discharged.  No acute distress.

## 2015-03-16 NOTE — ED Provider Notes (Signed)
CSN: [id number]     Arrival date & time 03/16/15  1639 History   First MD Initiated Contact with Patient 03/16/15 1701     Chief Complaint  Patient presents with  . Abdominal Pain  . Cancer     (Consider location/radiation/quality/duration/timing/severity/associated sxs/prior Treatment) Patient is a 54 y.o. female presenting with abdominal pain.  Abdominal Pain Pain location:  R flank Pain quality: aching and sharp   Pain radiation: to outside of right leg (lateral) Pain severity:  Severe Onset quality:  Sudden Timing:  Intermittent (aching always there, but sharp pain intermittent) Chronicity:  New Ineffective treatments: oxycodone, not worse with eating. Associated symptoms: dysuria (chronic, since placed left ureter), fatigue, fever (100.2 last night, 98.7 today (no antipyretics)) and nausea   Associated symptoms: no anorexia (decreased appetite, nausea), no chest pain, no constipation (utnil Wednesday, then BM), no cough, no diarrhea, no shortness of breath (with pain), no sore throat, no vaginal bleeding, no vaginal discharge and no vomiting     Past Medical History  Diagnosis Date  . Wears glasses   . Diverticulosis     dx 09/2014  . Esophagus hernia     hx of  . GERD (gastroesophageal reflux disease)     once in a while  . Anemia     hx of, none since hysterectomy  . Headache     severe, for last few mornings  . Cancer ([place])     "ovary tumor-on colon, kidney, spots on liver and lungs"  . Anxiety     recent, situational   Past Surgical History  Procedure Laterality Date  . Wisdom tooth extraction  ~2001  . Cholecystectomy  2007    lap choli  . Breast lumpectomy Right 10/2013  . Vaginal hysterectomy  2005  . Colonoscopy  2016  . Laparotomy N/A 01/16/2015    Procedure: EXPLORATORY LAPAROTOMY ;  Surgeon: Nancy Marus, MD;  Location: [facility];  Service: Gynecology;  Laterality: N/A;  . Oophorectomy Bilateral 01/16/2015    Procedure: BILATERAL OOPHORECTOMY RESECTION  OF PELVIC MASS ;  Surgeon: Nancy Marus, MD;  Location: [facility];  Service: Gynecology;  Laterality: Bilateral;  . Cystoscopy w/ ureteral stent placement Left 01/16/2015    Procedure: CYSTOSCOPY WITH RETROGRADE PYELOGRAM/URETERAL STENT PLACEMENT;  Surgeon: Carolan Clines, MD;  Location: [facility];  Service: Urology;  Laterality: Left;  . Porta cath insertion    . Colonoscopy with propofol N/A 02/09/2015    Procedure: COLONOSCOPY WITH PROPOFOL;  Surgeon: Ronald Lobo, MD;  Location: [place];  Service: Endoscopy;  Laterality: N/A;   Family History  Problem Relation Age of Onset  . Pancreatic cancer Father 83  . Breast cancer Paternal Aunt     dx. 54-41  . Breast cancer Paternal Grandmother 21  . Cancer Paternal Grandmother     "adenocarcinoma of gall bladder possibly"  . Colon cancer Maternal Grandfather 20  . Alzheimer's disease Paternal Grandfather   . Skin cancer Mother     dx. 110-61; squamous cell carcinoma  . Colon polyps Mother     6-7 total  . Other Mother     TAH due to issue with IUD; abnormal breast findings  . Other Sister     TAH due to pre-cancerous cells of cervix; still has ovarie  . Breast cancer Cousin     dx. 47 or younger; radical mastectomy; BRCA+  . Throat cancer Cousin     dx. 30; smokeless tobacco user  . Pancreatic cancer Other   .  Lung cancer Cousin   . Pancreatic cancer Other    Social History  Substance Use Topics  . Smoking status: Former Smoker -- 4 years    Types: Cigarettes    Quit date: 10/07/1985  . Smokeless tobacco: Never Used     Comment: 2-3 cigs per day  . Alcohol Use: 4.2 oz/week    7 Glasses of wine per week     Comment: glass nightly   OB History    No data available     Review of Systems  Constitutional: Positive for fever (100.2 last night, 98.7 today (no antipyretics)) and fatigue.  HENT: Negative for sore throat.   Eyes: Negative for visual disturbance.  Respiratory: Negative for cough and shortness of breath (with  pain).   Cardiovascular: Negative for chest pain.  Gastrointestinal: Positive for nausea and abdominal pain. Negative for vomiting, diarrhea, constipation (utnil Wednesday, then BM) and anorexia (decreased appetite, nausea).  Genitourinary: Positive for dysuria (chronic, since placed left ureter) and flank pain. Negative for vaginal bleeding, vaginal discharge and difficulty urinating.  Musculoskeletal: Negative for back pain and neck pain.  Skin: Negative for rash.  Neurological: Negative for syncope and headaches (last night, improved today).      Allergies  Other and [place]von  Home Medications   Prior to Admission medications   Medication Sig Start Date End Date Taking? Authorizing Provider  ALPRAZolam Duanne Moron) 0.5 MG tablet take 1 tablet by mouth every 8 hours as needed for anxiety 02/28/15  Yes Historical Provider, MD  calcium carbonate (TUMS - DOSED IN MG ELEMENTAL CALCIUM) 500 MG chewable tablet Chew 1-2 tablets by mouth 3 (three) times daily as needed for indigestion or heartburn.   Yes Historical Provider, MD  lidocaine-prilocaine (EMLA) cream Apply to port site one hour prior to use. Do not rub in. Cover with plastic. Patient taking differently: Apply 1 application topically daily as needed (for port access). Apply to port site one hour prior to use. Do not rub in. Cover with plastic. 02/07/15  Yes Ladell Pier, MD  ondansetron (ZOFRAN) 8 MG tablet Take 1 tablet (8 mg total) by mouth every 8 (eight) hours as needed for nausea or vomiting. 02/28/15  Yes Ladell Pier, MD  oxyCODONE (OXY IR/ROXICODONE) 5 MG immediate release tablet Take 1-2 tabs every 4 hours as needed for pain Patient taking differently: Take 10 mg by mouth every 4 (four) hours as needed for moderate pain or severe pain.  03/01/15  Yes Ladell Pier, MD  PRESCRIPTION MEDICATION Chemo at [facility] through pump for 2 days at home and gets pump connected after Chemo IV at [facility] and then comes back to get it  disconnected after the 2 days   Yes Historical Provider, MD  prochlorperazine (COMPAZINE) 10 MG tablet Take 1 tablet (10 mg total) by mouth every 6 (six) hours as needed for nausea or vomiting. 02/07/15  Yes Ladell Pier, MD  sucralfate (CARAFATE) 1 G tablet Take 1 g by mouth 4 (four) times daily as needed (for stomach ulcers).  01/30/15  Yes Historical Provider, MD  ciprofloxacin (CIPRO) 500 MG tablet Take 1 tablet (500 mg total) by mouth every 12 (twelve) hours. 03/16/15 03/30/15  Gareth Morgan, MD  dexamethasone (DECADRON) 4 MG tablet TAKE TWO TABS TWICE A DAY FOR TWO DAYS Patient not taking: Reported on 03/16/2015 03/05/15   Ladell Pier, MD  gabapentin (NEURONTIN) 300 MG capsule Take 2 capsules (600 mg total) by mouth 3 (three) times daily.  Patient not taking: Reported on 03/14/2015 02/16/15   Dorothyann Gibbs, NP  LORazepam (ATIVAN) 0.5 MG tablet Take 1 tablet (0.5 mg total) by mouth every 6 (six) hours as needed for anxiety (nausea). Patient not taking: Reported on 03/16/2015 03/06/15   Susanne Borders, NP  metroNIDAZOLE (FLAGYL) 500 MG tablet Take 1 tablet (500 mg total) by mouth 2 (two) times daily. 03/16/15   Gareth Morgan, MD  ondansetron (ZOFRAN ODT) 4 MG disintegrating tablet Take 1 tablet (4 mg total) by mouth every 8 (eight) hours as needed for nausea or vomiting. 03/16/15   Gareth Morgan, MD   BP 136/77 mmHg  Pulse 110  Temp(Src) 98.5 F (36.9 C) (Oral)  Resp 16  Ht _0  (1.651 m)  Wt 160 lb (72.576 kg)  BMI 26.63 kg/m2  SpO2 100% Physical Exam  Constitutional: She is oriented to person, place, and time. She appears well-developed and well-nourished. No distress.  HENT:  Head: Normocephalic and atraumatic.  Eyes: Conjunctivae and EOM are normal.  Neck: Normal range of motion.  Cardiovascular: Normal rate, regular rhythm, normal heart sounds and intact distal pulses.  Exam reveals no gallop and no friction rub.   No murmur heard. Pulmonary/Chest: Effort normal and breath  sounds normal. No respiratory distress. She has no wheezes. She has no rales.  Abdominal: Soft. She exhibits no distension. There is tenderness in the left lower quadrant. There is no guarding and no CVA tenderness.  Musculoskeletal: She exhibits no edema or tenderness.  Neurological: She is alert and oriented to person, place, and time.  Skin: Skin is warm and dry. No rash noted. She is not diaphoretic. No erythema.  Nursing note and vitals reviewed.   ED Course  Procedures (including critical care time) Labs Review Labs Reviewed  URINALYSIS, ROUTINE W REFLEX MICROSCOPIC (NOT AT [facility]) - Abnormal; Notable for the following:    APPearance TURBID (*)    Hgb urine dipstick MODERATE (*)    Bilirubin Urine SMALL (*)    Ketones, ur 15 (*)    Protein, ur 100 (*)    Leukocytes, UA MODERATE (*)    All other components within normal limits  URINE MICROSCOPIC-ADD ON - Abnormal; Notable for the following:    Squamous Epithelial / LPF MANY (*)    Bacteria, UA MANY (*)    Crystals CA OXALATE CRYSTALS (*)    All other components within normal limits  URINE CULTURE    Imaging Review Ct Abdomen Pelvis Wo Contrast  03/16/2015   CLINICAL DATA:  Right flank pain since this morning. History of left ureteric stent and cholecystectomy. Ovarian cancer, surgery 01/03/2015. Status post 2 around chemotherapy.  EXAM: CT ABDOMEN AND PELVIS WITHOUT CONTRAST  TECHNIQUE: Multidetector CT imaging of the abdomen and pelvis was performed following the standard protocol without IV contrast.  COMPARISON:  Diagnostic CT of 01/03/2015.  PET of 02/06/2015.  FINDINGS: Lower chest: Pulmonary nodules which area decreased since 02/06/2015. For example, a lateral right lower lobe 5 mm nodule on image/series 3/4 is decreased from 10 mm on the prior exam (when remeasured). No lobar consolidation. Normal heart size without pericardial or pleural effusion. Small hiatal hernia.  Hepatobiliary: Extensive hepatic metastasis. Suboptimally  evaluated on this noncontrast study. Index 2.2 cm medial segment left liver lobe lesion on image/series 12/2 measured 2.2 cm on the prior PET.  A right hepatic lobe 2.4 cm lesion on image/series 20/2 is similar to the prior exam (when remeasured). Cholecystectomy, without biliary ductal dilatation.  Pancreas: Normal, without mass or ductal dilatation.  Spleen: Old granulomatous disease within the spleen.  Adrenals/Urinary Tract: Normal adrenal glands. Moderate left renal cortical thinning with chronic left-sided hydroureteronephrosis. A left-sided ureteric stent originates within the proximal left ureter and terminates within a decompressed urinary bladder. No right-sided hydronephrosis or hydroureter.  Stomach/Bowel: Normal remainder of the stomach. Extensive colonic diverticulosis. Moderate pericolonic edema adjacent the sigmoid, including on image/series 58-60/2. This was similar on the prior PET. No drainable abscess or free perforation. Colonic stool burden suggests constipation. The cecum extends into the pelvis. The appendix is likely identified within the central pelvis on image/series 63/2.  Normal small bowel.  Vascular/Lymphatic: Retroperitoneal adenopathy. Index left periaortic node measures 12 mm on image/series 31/2. Compare 15 mm on the prior PET. Soft tissue fullness within the high left hemipelvis, posterior to a row of surgical clips could represent adenopathy or residual adnexal mass. This is grossly similar to on the prior exam. Measures on the order of 2.7 cm on image/series 49/2. Normal caliber of the aorta and branch vessels.  Reproductive: Hysterectomy.  Other: No significant free fluid.  Musculoskeletal: No acute osseous abnormality.  IMPRESSION: 1. No right-sided urinary tract calculi or hydronephrosis. Similar appearance of the left-sided urinary tract with ureteric stent in place, chronic hydronephrosis, and chronic cortical thinning. 2. Improvement in imaged pulmonary parenchymal and  abdominal nodal metastasis since 02/06/2015. 3. Similar hepatic metastasis since 02/06/2015 4. Edema adjacent the sigmoid colon, suspicious for diverticulitis. Suboptimally evaluated secondary to stone study technique. This edema was present on the prior exam, and could less likely be postoperative. 5. Similar soft tissue fullness within the high left hemipelvis. This could represent residual adnexal mass and/or adenopathy.   Electronically Signed   By: Abigail Miyamoto M.D.   On: 03/16/2015 15:47   Ct Abdomen Pelvis W Contrast  03/16/2015   CLINICAL DATA:  Patient with right flank pain radiating to the right lower back and down the leg. Patient with history of diverticulosis.  EXAM: CT ABDOMEN AND PELVIS WITH CONTRAST  TECHNIQUE: Multidetector CT imaging of the abdomen and pelvis was performed using the standard protocol following bolus administration of intravenous contrast.  CONTRAST:  182m OMNIPAQUE IOHEXOL 300 MG/ML  SOLN  COMPARISON:  CT abdomen pelvis 01/03/2015; PET-CT 30 2016; CT 03/16/2015.  FINDINGS: Lower chest: Grossly unchanged 1.7 cm left lower lobe nodule (image 13; series 2). Additional pulmonary nodules have decreased in size including an 8 mm right lower lobe nodule (image 10; series 2), and an 11 mm right lower lobe pulmonary nodule (image 9; series 2), previously measuring 15 mm.  Hepatobiliary: When compared to prior contrast-enhanced CT 01/03/2015 many of hepatic lesions have increased in size however when compared to recent PET-CT many of these lesions appear grossly stable however comparison is difficult given lack of contrast enhancement on prior CT evaluation. Reference lesion within the right hepatic lobe measures 3.2 x 2.6 cm (image 17; series 2), previously 3.0 x 2.4 cm. Status post cholecystectomy. No intrahepatic or extrahepatic biliary ductal dilatation.  Pancreas: Unremarkable  Spleen: Calcified granulomata in the spleen.  Adrenals/Urinary Tract: Normal adrenal glands. The left  kidney is atrophic. There is a left-sided double-J nephroureteral stent place. Persistent left-sided hydronephrosis. The urinary bladder is decompressed.  Stomach/Bowel: Extensive sigmoid colonic diverticulosis. There is edema surrounding the sigmoid colon raising the possibility of acute diverticulitis. Additionally within the sigmoid colon there is a 1.5 cm low-attenuation mass (image 70; series 2). The no free fluid or free intraperitoneal  air. No evidence for bowel obstruction.  Vascular/Lymphatic: Re- demonstrated multiple enlarged retroperitoneal lymph nodes measuring up to 1.2 cm. Normal caliber abdominal aorta.  Other: Along the anterior aspect of the left psoas muscle there is an enhancing mass with adjacent surgical clips. The mass measures approximately 2.6 x 5.8 cm in size.  Musculoskeletal: No aggressive or acute appearing osseous lesions.  IMPRESSION: Sigmoid colonic diverticulosis with fat stranding about the descending colon raising the possibility of acute diverticulitis.  There is enhancing soft tissue mass involving the left psoas muscle high within the left hemipelvis concerning for residual adnexal tumor or metastatic disease.  Innumerable pulmonary nodules improved since 02/06/2015.  Grossly similar hepatic metastasis since 02/06/2015.  Retroperitoneal adenopathy concerning for metastatic disease.  Enhancing mass within the sigmoid colon most suggestive of primary colonic carcinoma. Alternatively this may represent a small intramural abscess in the setting of acute diverticulitis.   Electronically Signed   By: Lovey Newcomer M.D.   On: 03/16/2015 20:44   I have personally reviewed and evaluated these images and lab results as part of my medical decision-making.   EKG Interpretation None      MDM   Final diagnoses:  Possible Diverticulitis of large intestine without perforation or abscess without bleeding, question of abscess versus carcinoma  UTI (lower urinary tract infection)   Other neutropenia ([place])   54 year old female with history of metastatic ovarian cancer presents with concern of right flank pain starting today, with noncontrast CT abdomen and pelvis done oncologist concerning for possible sigmoid diverticulitis for which she was recommended to come to the emergency department.   Reviewed labs from patient's oncology appointment this morning which revealed neutropenia, and normal renal function.  Patient had reported fevers yesterday, however today is afebrile, and hemodynamically stable.   CT abdomen pelvis with contrast was obtained to evaluate for signs of intra-abdominal abscess or confirmation diverticulitis.  Scan showed likely enhancing mass in the sigmoid colon most suggestive of colonic carcinoma however is difficult to exclude a small intramural abscess, with other findings including diverticulosis with possible diverticulitis, and findings to suggest soft tissue mass in the left psoas muscle adnexal tumor.    Discussed all results with patient in detail. Given patient's immunocompromise, recommended inpatient admission for concern of possible diverticulitis with question of intramural abscess (history not consistent with this, however pt with abdominal tenderness on L) as well as concern for UTI.  Patient is hemodynamically stable, afebrile today and would like to go home. Discussed recommendation for inpatient admission given guidelines for treatment of possible complicated diverticulitis and concern for UTI in setting of immunocompromise, however, pt oriented, able to make decisions and declines admission aware of risks. Pt with friend who is nurse and will continue to monitor pt as outpt.  Gave rx for cipro/flagyl and discussed reasons to return in detail.     Gareth Morgan, MD 03/17/15 [phone]

## 2015-03-17 ENCOUNTER — Encounter: Payer: Self-pay | Admitting: Nurse Practitioner

## 2015-03-17 DIAGNOSIS — R109 Unspecified abdominal pain: Secondary | ICD-10-CM | POA: Insufficient documentation

## 2015-03-17 DIAGNOSIS — N390 Urinary tract infection, site not specified: Secondary | ICD-10-CM | POA: Insufficient documentation

## 2015-03-17 DIAGNOSIS — D701 Agranulocytosis secondary to cancer chemotherapy: Secondary | ICD-10-CM | POA: Insufficient documentation

## 2015-03-17 DIAGNOSIS — T451X5A Adverse effect of antineoplastic and immunosuppressive drugs, initial encounter: Secondary | ICD-10-CM

## 2015-03-17 NOTE — Assessment & Plan Note (Signed)
Patient received her last cycle of FOLFOX chemotherapy on 02/28/2015.  Patient was noted per labs today to be neutropenic with an ANC of 0.3 secondary to chemotherapy.  Patient reports a fever to maximum 100.4 within the past 24 hours; but was afebrile at the cancer center.  However, patient is complaining of some right flank pain; and obtained a CT without contrast of the abdomen/pelvis later this afternoon which revealed possible diverticulitis.  Patient was advised to present to the [facility] emergency department for further evaluation and management.

## 2015-03-17 NOTE — Progress Notes (Signed)
SYMPTOM MANAGEMENT CLINIC   HPI: Brianna Vasquez 54 y.o. female diagnosed with ovarian cancer; with both liver and bone metastasis.   Patient has a history some mild, chronic lower back pain; but is now complaining of some new onset right flank pain within the past 24 hours.  She states that all of her urinary tract infection symptoms that she was diagnosed with earlier this week.  Have since resolved.  She does report a fever to maximum 100.4 within the past 24 hours, however.  On exam today.-Patient does have some obvious right flank tenderness with palpation and any movement whatsoever.  No true right upper quadrant discomfort with palpation.  Given patient's symptoms.-Arranged for patient to undergo a CT without contrast (stone study).  Call from Radiology advised that pt most likely had diverticulitis.    After reviewing these findings with Dr. Benay Spice- spoke with pt and advised that she go directly to the ED for further eval and management of possible diverticulitis.   Called ED charge RN to inform of pt's pending arrival to the ED; and also advised that pt is currently neutropenic.   HPI  ROS  Past Medical History  Diagnosis Date  . Wears glasses   . Diverticulosis     dx 09/2014  . Esophagus hernia     hx of  . GERD (gastroesophageal reflux disease)     once in a while  . Anemia     hx of, none since hysterectomy  . Headache     severe, for last few mornings  . Cancer ([place])     "ovary tumor-on colon, kidney, spots on liver and lungs"  . Anxiety     recent, situational    Past Surgical History  Procedure Laterality Date  . Wisdom tooth extraction  ~2001  . Cholecystectomy  2007    lap choli  . Breast lumpectomy Right 10/2013  . Vaginal hysterectomy  2005  . Colonoscopy  2016  . Laparotomy N/A 01/16/2015    Procedure: EXPLORATORY LAPAROTOMY ;  Surgeon: Nancy Marus, MD;  Location: [facility];  Service: Gynecology;  Laterality: N/A;  . Oophorectomy Bilateral  01/16/2015    Procedure: BILATERAL OOPHORECTOMY RESECTION OF PELVIC MASS ;  Surgeon: Nancy Marus, MD;  Location: [facility];  Service: Gynecology;  Laterality: Bilateral;  . Cystoscopy w/ ureteral stent placement Left 01/16/2015    Procedure: CYSTOSCOPY WITH RETROGRADE PYELOGRAM/URETERAL STENT PLACEMENT;  Surgeon: Carolan Clines, MD;  Location: [facility];  Service: Urology;  Laterality: Left;  . Porta cath insertion    . Colonoscopy with propofol N/A 02/09/2015    Procedure: COLONOSCOPY WITH PROPOFOL;  Surgeon: Ronald Lobo, MD;  Location: [place];  Service: Endoscopy;  Laterality: N/A;    has Atypical ductal hyperplasia of right breast; Pelvic mass in female; Ovarian mass; Ovarian cancer on left [facility]); Family history of breast cancer in female; Family history of pancreatic cancer; Family history of colon cancer; Dehydration; Nausea without vomiting; UTI (urinary tract infection); Flank pain; and Chemotherapy induced neutropenia (HCC) on her problem list.    is allergic to other and darvon.    Medication List       This list is accurate as of: 03/16/15  4:42 PM.  Always use your most recent med list.               ALPRAZolam 0.5 MG tablet  Commonly known as:  XANAX  take 1 tablet by mouth every 8 hours as needed for anxiety  calcium carbonate 500 MG chewable tablet  Commonly known as:  TUMS - dosed in mg elemental calcium  Chew 1-2 tablets by mouth 3 (three) times daily as needed for indigestion or heartburn.     ciprofloxacin 500 MG tablet  Commonly known as:  CIPRO  Take 1 tablet (500 mg total) by mouth 2 (two) times daily.     ciprofloxacin 500 MG tablet  Commonly known as:  CIPRO  Take 1 tablet (500 mg total) by mouth every 12 (twelve) hours.     dexamethasone 4 MG tablet  Commonly known as:  DECADRON  TAKE TWO TABS TWICE A DAY FOR TWO DAYS     gabapentin 300 MG capsule  Commonly known as:  NEURONTIN  Take 2 capsules (600 mg total) by mouth 3 (three) times daily.       lidocaine-prilocaine cream  Commonly known as:  EMLA  Apply to port site one hour prior to use. Do not rub in. Cover with plastic.     LORazepam 0.5 MG tablet  Commonly known as:  ATIVAN  Take 1 tablet (0.5 mg total) by mouth every 6 (six) hours as needed for anxiety (nausea).     metroNIDAZOLE 500 MG tablet  Commonly known as:  FLAGYL  Take 1 tablet (500 mg total) by mouth 2 (two) times daily.     naproxen sodium 220 MG tablet  Commonly known as:  ANAPROX  Take 440 mg by mouth 3 (three) times daily.     ondansetron 4 MG disintegrating tablet  Commonly known as:  ZOFRAN ODT  Take 1 tablet (4 mg total) by mouth every 8 (eight) hours as needed for nausea or vomiting.     ondansetron 8 MG tablet  Commonly known as:  ZOFRAN  Take 1 tablet (8 mg total) by mouth every 8 (eight) hours as needed for nausea or vomiting.     oxyCODONE 5 MG immediate release tablet  Commonly known as:  Oxy IR/ROXICODONE  Take 1-2 tabs every 4 hours as needed for pain     PRESCRIPTION MEDICATION  Chemo at [place] through pump for 2 days at home and gets pump connected after Chemo IV at [facility] and then comes back to get it disconnected after the 2 days     prochlorperazine 10 MG tablet  Commonly known as:  COMPAZINE  Take 1 tablet (10 mg total) by mouth every 6 (six) hours as needed for nausea or vomiting.     sucralfate 1 G tablet  Commonly known as:  CARAFATE  Take 1 g by mouth 4 (four) times daily as needed (for stomach ulcers).         PHYSICAL EXAMINATION  Oncology Vitals 03/16/2015 03/16/2015 03/16/2015 03/16/2015 03/16/2015 03/16/2015 03/14/2015  Height - - - - 165 cm - 165 cm  Weight - - - - 72.576 kg - 72.757 kg  Weight (lbs) - - - - 160 lbs - 160 lbs 6 oz  BMI (kg/m2) - - - - 26.63 kg/m2 - 26.69 kg/m2  Temp 98.5 - - - 98.2 98.4 98.6  Pulse 110 94 91 59 101 95 125  Resp 16 16 - $Re'18 16 20 18  'LFq$ SpO2 100 100 100 100 99 99 100  BSA (m2) - - - - 1.82 m2 - 1.83 m2   BP Readings from Last 3  Encounters:  03/16/15 136/77  03/16/15 113/72  03/14/15 133/72    Physical Exam  Constitutional: She is oriented to person, place, and time  and well-developed, well-nourished, and in no distress.  HENT:  Head: Normocephalic and atraumatic.  Eyes: Conjunctivae and EOM are normal. Pupils are equal, round, and reactive to light. Right eye exhibits no discharge. Left eye exhibits no discharge. No scleral icterus.  Neck: Normal range of motion. Neck supple. No JVD present. No tracheal deviation present. No thyromegaly present.  Cardiovascular: Normal rate, regular rhythm, normal heart sounds and intact distal pulses.   Pulmonary/Chest: Effort normal and breath sounds normal. No respiratory distress. She has no wheezes. She has no rales. She exhibits no tenderness.  Abdominal: Soft. Bowel sounds are normal. She exhibits no distension and no mass. There is no tenderness. There is no rebound and no guarding.  Right flank pain with palp.  Musculoskeletal: Normal range of motion. She exhibits no edema or tenderness.  Lymphadenopathy:    She has no cervical adenopathy.  Neurological: She is alert and oriented to person, place, and time. Gait normal.  Skin: Skin is warm and dry. No rash noted. No erythema. There is pallor.  Psychiatric: Affect normal.  Nursing note and vitals reviewed.   LABORATORY DATA:. Admission on 03/16/2015, Discharged on 03/16/2015  Component Date Value Ref Range Status  . Color, Urine 03/16/2015 YELLOW  YELLOW Final  . APPearance 03/16/2015 TURBID* CLEAR Final  . Specific Gravity, Urine 03/16/2015 1.027  1.005 - 1.030 Final  . pH 03/16/2015 5.5  5.0 - 8.0 Final  . Glucose, UA 03/16/2015 NEGATIVE  NEGATIVE mg/dL Final  . Hgb urine dipstick 03/16/2015 MODERATE* NEGATIVE Final  . Bilirubin Urine 03/16/2015 SMALL* NEGATIVE Final  . Ketones, ur 03/16/2015 15* NEGATIVE mg/dL Final  . Protein, ur 03/16/2015 100* NEGATIVE mg/dL Final  . Urobilinogen, UA 03/16/2015 1.0  0.0 -  1.0 mg/dL Final  . Nitrite 03/16/2015 NEGATIVE  NEGATIVE Final  . Leukocytes, UA 03/16/2015 MODERATE* NEGATIVE Final  . Specimen Description 03/16/2015 URINE, CLEAN CATCH   Final  . Special Requests 03/16/2015 NONE   Final  . Culture 03/16/2015    Final                   Value:TOO YOUNG TO READ Performed at [facility]   . Report Status 03/16/2015 PENDING   Incomplete  . Squamous Epithelial / LPF 03/16/2015 MANY* RARE Final  . WBC, UA 03/16/2015 21-50  <3 WBC/hpf Final  . RBC / HPF 03/16/2015 7-10  <3 RBC/hpf Final  . Bacteria, UA 03/16/2015 MANY* RARE Final  . Crystals 03/16/2015 CA OXALATE CRYSTALS* NEGATIVE Final  Appointment on 03/16/2015  Component Date Value Ref Range Status  . WBC 03/16/2015 3.4* 3.9 - 10.3 10e3/uL Final  . NEUT# 03/16/2015 0.3* 1.5 - 6.5 10e3/uL Final  . HGB 03/16/2015 11.1* 11.6 - 15.9 g/dL Final  . HCT 03/16/2015 34.2* 34.8 - 46.6 % Final  . Platelets 03/16/2015 217  145 - 400 10e3/uL Final  . MCV 03/16/2015 88.2  79.5 - 101.0 fL Final  . MCH 03/16/2015 28.7  25.1 - 34.0 pg Final  . MCHC 03/16/2015 32.6  31.5 - 36.0 g/dL Final  . RBC 03/16/2015 3.88  3.70 - 5.45 10e6/uL Final  . RDW 03/16/2015 16.1* 11.2 - 14.5 % Final  . lymph# 03/16/2015 1.6  0.9 - 3.3 10e3/uL Final  . MONO# 03/16/2015 1.2* 0.1 - 0.9 10e3/uL Final  . Eosinophils Absolute 03/16/2015 0.3  0.0 - 0.5 10e3/uL Final  . Basophils Absolute 03/16/2015 0.1  0.0 - 0.1 10e3/uL Final  . NEUT% 03/16/2015 8.2* 38.4 - 76.8 %  Final  . LYMPH% 03/16/2015 46.8  14.0 - 49.7 % Final  . MONO% 03/16/2015 34.4* 0.0 - 14.0 % Final  . EOS% 03/16/2015 7.4* 0.0 - 7.0 % Final  . BASO% 03/16/2015 3.2* 0.0 - 2.0 % Final  . Sodium 03/16/2015 140  136 - 145 mEq/L Final  . Potassium 03/16/2015 3.6  3.5 - 5.1 mEq/L Final  . Chloride 03/16/2015 107  98 - 109 mEq/L Final  . CO2 03/16/2015 25  22 - 29 mEq/L Final  . Glucose 03/16/2015 102  70 - 140 mg/dl Final   Glucose reference range is for nonfasting  patients. Fasting glucose reference range is 70- 100.  Marland Kitchen BUN 03/16/2015 6.5* 7.0 - 26.0 mg/dL Final  . Creatinine 03/16/2015 0.9  0.6 - 1.1 mg/dL Final  . Total Bilirubin 03/16/2015 0.77  0.20 - 1.20 mg/dL Final  . Alkaline Phosphatase 03/16/2015 188* 40 - 150 U/L Final  . AST 03/16/2015 28  5 - 34 U/L Final  . ALT 03/16/2015 17  0 - 55 U/L Final  . Total Protein 03/16/2015 6.8  6.4 - 8.3 g/dL Final  . Albumin 03/16/2015 3.0* 3.5 - 5.0 g/dL Final  . Calcium 03/16/2015 9.2  8.4 - 10.4 mg/dL Final  . Anion Gap 03/16/2015 7  3 - 11 mEq/L Final  . EGFR 03/16/2015 76* >90 ml/min/1.73 m2 Final   eGFR is calculated using the CKD-EPI Creatinine Equation (2009)  Appointment on 03/14/2015  Component Date Value Ref Range Status  . WBC 03/14/2015 2.4* 3.9 - 10.3 10e3/uL Final  . NEUT# 03/14/2015 0.2* 1.5 - 6.5 10e3/uL Final  . HGB 03/14/2015 11.2* 11.6 - 15.9 g/dL Final  . HCT 03/14/2015 34.2* 34.8 - 46.6 % Final  . Platelets 03/14/2015 166  145 - 400 10e3/uL Final  . MCV 03/14/2015 89.1  79.5 - 101.0 fL Final  . MCH 03/14/2015 29.2  25.1 - 34.0 pg Final  . MCHC 03/14/2015 32.7  31.5 - 36.0 g/dL Final  . RBC 03/14/2015 3.84  3.70 - 5.45 10e6/uL Final  . RDW 03/14/2015 16.0* 11.2 - 14.5 % Final  . lymph# 03/14/2015 1.1  0.9 - 3.3 10e3/uL Final  . MONO# 03/14/2015 0.7  0.1 - 0.9 10e3/uL Final  . Eosinophils Absolute 03/14/2015 0.2  0.0 - 0.5 10e3/uL Final  . Basophils Absolute 03/14/2015 0.1  0.0 - 0.1 10e3/uL Final  . NEUT% 03/14/2015 8.5* 38.4 - 76.8 % Final  . LYMPH% 03/14/2015 48.5  14.0 - 49.7 % Final  . MONO% 03/14/2015 31.5* 0.0 - 14.0 % Final  . EOS% 03/14/2015 7.7* 0.0 - 7.0 % Final  . BASO% 03/14/2015 3.8* 0.0 - 2.0 % Final  . nRBC 03/14/2015 0  0 - 0 % Final  . Sodium 03/14/2015 140  136 - 145 mEq/L Final  . Potassium 03/14/2015 3.9  3.5 - 5.1 mEq/L Final  . Chloride 03/14/2015 109  98 - 109 mEq/L Final  . CO2 03/14/2015 25  22 - 29 mEq/L Final  . Glucose 03/14/2015 120  70 - 140  mg/dl Final   Glucose reference range is for nonfasting patients. Fasting glucose reference range is 70- 100.  Marland Kitchen BUN 03/14/2015 6.2* 7.0 - 26.0 mg/dL Final  . Creatinine 03/14/2015 1.0  0.6 - 1.1 mg/dL Final  . Total Bilirubin 03/14/2015 1.10  0.20 - 1.20 mg/dL Final  . Alkaline Phosphatase 03/14/2015 203* 40 - 150 U/L Final  . AST 03/14/2015 35* 5 - 34 U/L Final  . ALT 03/14/2015 24  0 - 55 U/L Final  . Total Protein 03/14/2015 7.0  6.4 - 8.3 g/dL Final  . Albumin 03/14/2015 3.2* 3.5 - 5.0 g/dL Final  . Calcium 03/14/2015 9.5  8.4 - 10.4 mg/dL Final  . Anion Gap 03/14/2015 5  3 - 11 mEq/L Final  . EGFR 03/14/2015 68* >90 ml/min/1.73 m2 Final   eGFR is calculated using the CKD-EPI Creatinine Equation (2009)  . CEA 03/14/2015 83.8* 0.0 - 5.0 ng/mL Final     RADIOGRAPHIC STUDIES: Ct Abdomen Pelvis Wo Contrast  03/16/2015   CLINICAL DATA:  Right flank pain since this morning. History of left ureteric stent and cholecystectomy. Ovarian cancer, surgery 01/03/2015. Status post 2 around chemotherapy.  EXAM: CT ABDOMEN AND PELVIS WITHOUT CONTRAST  TECHNIQUE: Multidetector CT imaging of the abdomen and pelvis was performed following the standard protocol without IV contrast.  COMPARISON:  Diagnostic CT of 01/03/2015.  PET of 02/06/2015.  FINDINGS: Lower chest: Pulmonary nodules which area decreased since 02/06/2015. For example, a lateral right lower lobe 5 mm nodule on image/series 3/4 is decreased from 10 mm on the prior exam (when remeasured). No lobar consolidation. Normal heart size without pericardial or pleural effusion. Small hiatal hernia.  Hepatobiliary: Extensive hepatic metastasis. Suboptimally evaluated on this noncontrast study. Index 2.2 cm medial segment left liver lobe lesion on image/series 12/2 measured 2.2 cm on the prior PET.  A right hepatic lobe 2.4 cm lesion on image/series 20/2 is similar to the prior exam (when remeasured). Cholecystectomy, without biliary ductal dilatation.   Pancreas: Normal, without mass or ductal dilatation.  Spleen: Old granulomatous disease within the spleen.  Adrenals/Urinary Tract: Normal adrenal glands. Moderate left renal cortical thinning with chronic left-sided hydroureteronephrosis. A left-sided ureteric stent originates within the proximal left ureter and terminates within a decompressed urinary bladder. No right-sided hydronephrosis or hydroureter.  Stomach/Bowel: Normal remainder of the stomach. Extensive colonic diverticulosis. Moderate pericolonic edema adjacent the sigmoid, including on image/series 58-60/2. This was similar on the prior PET. No drainable abscess or free perforation. Colonic stool burden suggests constipation. The cecum extends into the pelvis. The appendix is likely identified within the central pelvis on image/series 63/2.  Normal small bowel.  Vascular/Lymphatic: Retroperitoneal adenopathy. Index left periaortic node measures 12 mm on image/series 31/2. Compare 15 mm on the prior PET. Soft tissue fullness within the high left hemipelvis, posterior to a row of surgical clips could represent adenopathy or residual adnexal mass. This is grossly similar to on the prior exam. Measures on the order of 2.7 cm on image/series 49/2. Normal caliber of the aorta and branch vessels.  Reproductive: Hysterectomy.  Other: No significant free fluid.  Musculoskeletal: No acute osseous abnormality.  IMPRESSION: 1. No right-sided urinary tract calculi or hydronephrosis. Similar appearance of the left-sided urinary tract with ureteric stent in place, chronic hydronephrosis, and chronic cortical thinning. 2. Improvement in imaged pulmonary parenchymal and abdominal nodal metastasis since 02/06/2015. 3. Similar hepatic metastasis since 02/06/2015 4. Edema adjacent the sigmoid colon, suspicious for diverticulitis. Suboptimally evaluated secondary to stone study technique. This edema was present on the prior exam, and could less likely be postoperative. 5.  Similar soft tissue fullness within the high left hemipelvis. This could represent residual adnexal mass and/or adenopathy.   Electronically Signed   By: Abigail Miyamoto M.D.   On: 03/16/2015 15:47   Ct Abdomen Pelvis W Contrast  03/17/2015   ADDENDUM REPORT: 03/17/2015 10:35 ADDENDUM: Addendum for addition of findings and impression : Within the right ovarian vein  there is a central filling defect demonstrated best on image 42; series 2) most compatible with ovarian vein thrombosis. These results were called by telephone at the time of interpretation on 03/17/2015 at 10:32 am to Dr. Tyrone Nine, who verbally acknowledged these results. Electronically Signed   By: Lovey Newcomer M.D.   On: 03/17/2015 10:35  03/17/2015   CLINICAL DATA:  Patient with right flank pain radiating to the right lower back and down the leg. Patient with history of diverticulosis.  EXAM: CT ABDOMEN AND PELVIS WITH CONTRAST  TECHNIQUE: Multidetector CT imaging of the abdomen and pelvis was performed using the standard protocol following bolus administration of intravenous contrast.  CONTRAST:  180mL OMNIPAQUE IOHEXOL 300 MG/ML  SOLN  COMPARISON:  CT abdomen pelvis 01/03/2015; PET-CT 30 2016; CT 03/16/2015.  FINDINGS: Lower chest: Grossly unchanged 1.7 cm left lower lobe nodule (image 13; series 2). Additional pulmonary nodules have decreased in size including an 8 mm right lower lobe nodule (image 10; series 2), and an 11 mm right lower lobe pulmonary nodule (image 9; series 2), previously measuring 15 mm.  Hepatobiliary: When compared to prior contrast-enhanced CT 01/03/2015 many of hepatic lesions have increased in size however when compared to recent PET-CT many of these lesions appear grossly stable however comparison is difficult given lack of contrast enhancement on prior CT evaluation. Reference lesion within the right hepatic lobe measures 3.2 x 2.6 cm (image 17; series 2), previously 3.0 x 2.4 cm. Status post cholecystectomy. No intrahepatic  or extrahepatic biliary ductal dilatation.  Pancreas: Unremarkable  Spleen: Calcified granulomata in the spleen.  Adrenals/Urinary Tract: Normal adrenal glands. The left kidney is atrophic. There is a left-sided double-J nephroureteral stent place. Persistent left-sided hydronephrosis. The urinary bladder is decompressed.  Stomach/Bowel: Extensive sigmoid colonic diverticulosis. There is edema surrounding the sigmoid colon raising the possibility of acute diverticulitis. Additionally within the sigmoid colon there is a 1.5 cm low-attenuation mass (image 70; series 2). The no free fluid or free intraperitoneal air. No evidence for bowel obstruction.  Vascular/Lymphatic: Re- demonstrated multiple enlarged retroperitoneal lymph nodes measuring up to 1.2 cm. Normal caliber abdominal aorta.  Other: Along the anterior aspect of the left psoas muscle there is an enhancing mass with adjacent surgical clips. The mass measures approximately 2.6 x 5.8 cm in size.  Musculoskeletal: No aggressive or acute appearing osseous lesions.  IMPRESSION: Sigmoid colonic diverticulosis with fat stranding about the descending colon raising the possibility of acute diverticulitis.  There is enhancing soft tissue mass involving the left psoas muscle high within the left hemipelvis concerning for residual adnexal tumor or metastatic disease.  Innumerable pulmonary nodules improved since 02/06/2015.  Grossly similar hepatic metastasis since 02/06/2015.  Retroperitoneal adenopathy concerning for metastatic disease.  Enhancing mass within the sigmoid colon most suggestive of primary colonic carcinoma. Alternatively this may represent a small intramural abscess in the setting of acute diverticulitis.  Electronically Signed: By: Lovey Newcomer M.D. On: 03/16/2015 20:44    ASSESSMENT/PLAN:    Ovarian cancer on left Pt received cycle 2 of her folfox chemo on 02/28/2015.  Blood counts obtained today reveal a WBC of 3.4, ANC 0.3, hemoglobin 11.1,  platelet count 217.  Patient is scheduled to return for labs, visit, and in her next chemotherapy on 03/20/2015.  UTI (urinary tract infection) Patient states that she was diagnosed with urinary tract infection earlier this week; and initiated Cipro antibiotics on Wednesday, 03/14/2015.  She denies any dysuria or frequency at the present time.  She  does however report a fever to maximum 100.4 within the past 24 hours.  Patient was afebrile on exam at the [facility] today.  Patient has a history of a chronic left ureter stent. After reviewing all details with Dr. Benay Spice- decision was made not to obtain a repeat UA.   Flank pain Patient has a history some mild, chronic lower back pain; but is now complaining of some new onset right flank pain within the past 24 hours.  She states that all of her urinary tract infection symptoms that she was diagnosed with earlier this week.  Have since resolved.  She does report a fever to maximum 100.4 within the past 24 hours, however.  On exam today.-Patient does have some obvious right flank tenderness with palpation and any movement whatsoever.  No true right upper quadrant discomfort with palpation.  Given patient's symptoms.-Arranged for patient to undergo a CT without contrast (stone study).  Call from Radiology advised that pt most likely had diverticulitis.    After reviewing these findings with Dr. Benay Spice- spoke with pt and advised that she go directly to the ED for further eval and management of possible diverticulitis.   Called ED charge RN to inform of pt's pending arrival to the ED; and also advised that pt is currently neutropenic.    Chemotherapy induced neutropenia [facility]) Patient received her last cycle of FOLFOX chemotherapy on 02/28/2015.  Patient was noted per labs today to be neutropenic with an ANC of 0.3 secondary to chemotherapy.  Patient reports a fever to maximum 100.4 within the past 24 hours; but was afebrile at the cancer  center.  However, patient is complaining of some right flank pain; and obtained a CT without contrast of the abdomen/pelvis later this afternoon which revealed possible diverticulitis.  Patient was advised to present to the [facility] emergency department for further evaluation and management.  Patient stated understanding of all instructions; and was in agreement with this plan of care. The patient knows to call the clinic with any problems, questions or concerns.   Review/collaboration with Dr. Benay Spice  regarding all aspects of patient's visit today.   Total time spent with patient was 40 minutes;  with greater than 75 percent of that time spent in face to face counseling regarding patient's symptoms,  and coordination of care and follow up.  Disclaimer:This dictation was prepared with Dragon/digital dictation along with [employer]. Any transcriptional errors that result from this process are unintentional.  Drue Second, NP 03/17/2015

## 2015-03-17 NOTE — Assessment & Plan Note (Signed)
Pt received cycle 2 of her folfox chemo on 02/28/2015.  Blood counts obtained today reveal a WBC of 3.4, ANC 0.3, hemoglobin 11.1, platelet count 217.  Patient is scheduled to return for labs, visit, and in her next chemotherapy on 03/20/2015.

## 2015-03-17 NOTE — Assessment & Plan Note (Signed)
Patient states that she was diagnosed with urinary tract infection earlier this week; and initiated Cipro antibiotics on Wednesday, 03/14/2015.  She denies any dysuria or frequency at the present time.  She does however report a fever to maximum 100.4 within the past 24 hours.  Patient was afebrile on exam at the [facility] today.  Patient has a history of a chronic left ureter stent. After reviewing all details with Dr. Benay Spice- decision was made not to obtain a repeat UA.

## 2015-03-17 NOTE — ED Provider Notes (Signed)
Received call from radiology. Reviewed yesterday CT scan with concern for ovarian vein thrombosis. Call the patient spoke to her about the new findings in recommendation for admission for IV antibiotics and anticoagulation. Patient feeling much better today. Has been afebrile overnight. Currently would like to stay home and would not like to risk exposure to infection with her neutropenia. Patient will discuss this with her oncologist, Dr. Benay Spice, on Monday. Discussed symptoms requring emergent return to the hospital. Patient states understanding.  Deno Etienne, DO 03/17/15 1045

## 2015-03-17 NOTE — Assessment & Plan Note (Signed)
Patient has a history some mild, chronic lower back pain; but is now complaining of some new onset right flank pain within the past 24 hours.  She states that all of her urinary tract infection symptoms that she was diagnosed with earlier this week.  Have since resolved.  She does report a fever to maximum 100.4 within the past 24 hours, however.  On exam today.-Patient does have some obvious right flank tenderness with palpation and any movement whatsoever.  No true right upper quadrant discomfort with palpation.  Given patient's symptoms.-Arranged for patient to undergo a CT without contrast (stone study).  Call from Radiology advised that pt most likely had diverticulitis.    After reviewing these findings with Dr. Benay Spice- spoke with pt and advised that she go directly to the ED for further eval and management of possible diverticulitis.   Called ED charge RN to inform of pt's pending arrival to the ED; and also advised that pt is currently neutropenic.

## 2015-03-18 LAB — URINE CULTURE

## 2015-03-19 ENCOUNTER — Ambulatory Visit (HOSPITAL_COMMUNITY)
Admission: RE | Admit: 2015-03-19 | Discharge: 2015-03-19 | Disposition: A | Discharge status: Home / Self Care | Payer: BLUE CROSS/BLUE SHIELD | Source: Ambulatory Visit | Attending: Oncology | Admitting: Oncology

## 2015-03-19 ENCOUNTER — Ambulatory Visit: Payer: Self-pay | Admitting: Genetic Counselor

## 2015-03-19 ENCOUNTER — Telehealth: Payer: Self-pay

## 2015-03-19 ENCOUNTER — Other Ambulatory Visit: Payer: Self-pay | Admitting: *Deleted

## 2015-03-19 ENCOUNTER — Telehealth: Payer: Self-pay | Admitting: Genetic Counselor

## 2015-03-19 ENCOUNTER — Telehealth: Payer: Self-pay | Admitting: *Deleted

## 2015-03-19 VITALS — BP 142/72 | HR 96 | Resp 16

## 2015-03-19 DIAGNOSIS — R110 Nausea: Secondary | ICD-10-CM

## 2015-03-19 DIAGNOSIS — Z1379 Encounter for other screening for genetic and chromosomal anomalies: Secondary | ICD-10-CM

## 2015-03-19 DIAGNOSIS — R112 Nausea with vomiting, unspecified: Secondary | ICD-10-CM | POA: Diagnosis not present

## 2015-03-19 DIAGNOSIS — C562 Malignant neoplasm of left ovary: Secondary | ICD-10-CM

## 2015-03-19 MED ORDER — ONDANSETRON HCL 4 MG/2ML IJ SOLN
8.0000 mg | Freq: Once | INTRAMUSCULAR | Status: AC
  Administered 2015-03-19: 8 mg via INTRAVENOUS
  Filled 2015-03-19: qty 4

## 2015-03-19 MED ORDER — SODIUM CHLORIDE 0.9 % IJ SOLN
10.0000 mL | INTRAMUSCULAR | Status: AC | PRN
  Administered 2015-03-19: 10 mL

## 2015-03-19 MED ORDER — HEPARIN SOD (PORK) LOCK FLUSH 100 UNIT/ML IV SOLN
500.0000 [IU] | INTRAVENOUS | Status: AC | PRN
  Administered 2015-03-19: 500 [IU]
  Filled 2015-03-19: qty 5

## 2015-03-19 MED ORDER — SODIUM CHLORIDE 0.9 % IV SOLN
INTRAVENOUS | Status: AC
  Administered 2015-03-19: 14:00:00 via INTRAVENOUS

## 2015-03-19 NOTE — Telephone Encounter (Signed)
Discussed with Brianna Vasquez that her genetic test results were negative for pathogenic mutations within any of 20 genes that would cause her to be at an increased risk for breast, ovarian, and other related cancers.  Thus, we still do not have an explanation for Brianna Vasquez ovarian cancer.  Most cancers happen by chance, so it could be that this is the case.  It could also be that maybe we just are not testing the right genes at this point in time.  We were particularly interested in the BRCA test results, as Brianna Vasquez reports that her paternal first cousin has a BRCA1 mutation.  If this is the case and a familial BRCA1 mutation does explain the paternal family history of cancer, then this result is even more reassuring for [place].  Brianna Vasquez will still try to get a copy of her cousin's report and send it to [place].  We still recommend that Brianna Vasquez sister have genetic counseling and testing.  We would be happy to facilitate that when she is ready.  Brianna Vasquez daughter is still considered to be at a somewhat increased risk for ovarian cancer just based on the history.  She should make her primary doctor aware of this history, so that she may receive the most appropriate cancer screening in the future.  She should also begin mammogram screening at 54, unless there is not actually a BRCA1 mutation that is explaining the paternal family history, then we recommend she begin mammogram screening at 47 years younger than the earliest breast cancer diagnosis.  Brianna Vasquez and her family members are welcome to call me with any further questions.

## 2015-03-19 NOTE — Progress Notes (Signed)
Patient complaining of pain in her rib cage up under her breasts. MD paged and notified. MD would like for [place] to reduce fluids to 200 cc/hr and continue to monitor.

## 2015-03-19 NOTE — Telephone Encounter (Signed)
Pt checking in after ER visit Friday. 2 oral antibiotics at home, struggling with energy, drinking fluids and nausea. HR 100, T 97.9. Does she need more fluids or what? Please call back.

## 2015-03-19 NOTE — Telephone Encounter (Signed)
Pt has not been able to eat much since ED on Friday. Pt states she is forcing fluids but does not feel well hydrated. Pt states she would like IVF. Scheduled appt with [facility] at 1330 today- 1 liter and 8mg  of Zofran IV. Pt las had zofran po at 0430. She states she is very fatigued and weak. She does not think she is prepared to have chemo tomorrow. Advised pt to come in for labs and see Dr. Benay Spice and they could discuss chemo tomorrow. Pt agrees with plan.

## 2015-03-19 NOTE — Telephone Encounter (Signed)
"  My daughter in law is here.  She can bring me over if you want to see me today."

## 2015-03-19 NOTE — Progress Notes (Signed)
Pt still concerned about the pressure in her upper abd. Requested to call to Dr.Sherrill to make sure that he wants to see her in the am. Dr. Benay Spice notified and explained to him what the patient needed. Dr. Benay Spice will see her in the morning and if she feels worst to give him a call. Pt voiced understanding.

## 2015-03-20 ENCOUNTER — Ambulatory Visit (HOSPITAL_BASED_OUTPATIENT_CLINIC_OR_DEPARTMENT_OTHER): Payer: BLUE CROSS/BLUE SHIELD | Admitting: Oncology

## 2015-03-20 ENCOUNTER — Other Ambulatory Visit (HOSPITAL_BASED_OUTPATIENT_CLINIC_OR_DEPARTMENT_OTHER): Payer: BLUE CROSS/BLUE SHIELD

## 2015-03-20 ENCOUNTER — Ambulatory Visit: Payer: BLUE CROSS/BLUE SHIELD

## 2015-03-20 ENCOUNTER — Other Ambulatory Visit: Payer: BLUE CROSS/BLUE SHIELD

## 2015-03-20 ENCOUNTER — Telehealth: Payer: Self-pay | Admitting: Oncology

## 2015-03-20 VITALS — BP 150/88 | HR 117 | Temp 98.3°F | Resp 18 | Ht 65.0 in | Wt 158.4 lb

## 2015-03-20 DIAGNOSIS — R110 Nausea: Secondary | ICD-10-CM

## 2015-03-20 DIAGNOSIS — C562 Malignant neoplasm of left ovary: Secondary | ICD-10-CM | POA: Diagnosis not present

## 2015-03-20 DIAGNOSIS — C787 Secondary malignant neoplasm of liver and intrahepatic bile duct: Secondary | ICD-10-CM

## 2015-03-20 DIAGNOSIS — C7800 Secondary malignant neoplasm of unspecified lung: Secondary | ICD-10-CM

## 2015-03-20 DIAGNOSIS — C5702 Malignant neoplasm of left fallopian tube: Secondary | ICD-10-CM

## 2015-03-20 DIAGNOSIS — C7951 Secondary malignant neoplasm of bone: Secondary | ICD-10-CM

## 2015-03-20 LAB — CBC WITH DIFFERENTIAL/PLATELET
BASO%: 3.2 % — ABNORMAL HIGH (ref 0.0–2.0)
Basophils Absolute: 0.2 10*3/uL — ABNORMAL HIGH (ref 0.0–0.1)
EOS ABS: 0.3 10*3/uL (ref 0.0–0.5)
EOS%: 4.6 % (ref 0.0–7.0)
HCT: 35.7 % (ref 34.8–46.6)
HEMOGLOBIN: 11.6 g/dL (ref 11.6–15.9)
LYMPH%: 28.5 % (ref 14.0–49.7)
MCH: 29 pg (ref 25.1–34.0)
MCHC: 32.6 g/dL (ref 31.5–36.0)
MCV: 89 fL (ref 79.5–101.0)
MONO#: 1.2 10*3/uL — AB (ref 0.1–0.9)
MONO%: 17.7 % — ABNORMAL HIGH (ref 0.0–14.0)
NEUT%: 46 % (ref 38.4–76.8)
NEUTROS ABS: 3.1 10*3/uL (ref 1.5–6.5)
PLATELETS: 358 10*3/uL (ref 145–400)
RBC: 4.01 10*6/uL (ref 3.70–5.45)
RDW: 16.5 % — ABNORMAL HIGH (ref 11.2–14.5)
WBC: 6.8 10*3/uL (ref 3.9–10.3)
lymph#: 1.9 10*3/uL (ref 0.9–3.3)

## 2015-03-20 LAB — TECHNOLOGIST REVIEW

## 2015-03-20 LAB — COMPREHENSIVE METABOLIC PANEL (CC13)
ALBUMIN: 3.1 g/dL — AB (ref 3.5–5.0)
ALK PHOS: 170 U/L — AB (ref 40–150)
ALT: 16 U/L (ref 0–55)
ANION GAP: 8 meq/L (ref 3–11)
AST: 34 U/L (ref 5–34)
BILIRUBIN TOTAL: 0.55 mg/dL (ref 0.20–1.20)
BUN: 5 mg/dL — ABNORMAL LOW (ref 7.0–26.0)
CO2: 25 mEq/L (ref 22–29)
Calcium: 9.5 mg/dL (ref 8.4–10.4)
Chloride: 108 mEq/L (ref 98–109)
Creatinine: 1 mg/dL (ref 0.6–1.1)
EGFR: 63 mL/min/{1.73_m2} — AB (ref >90)
Glucose: 118 mg/dl (ref 70–140)
Potassium: 3.5 mEq/L (ref 3.5–5.1)
Sodium: 142 mEq/L (ref 136–145)
TOTAL PROTEIN: 7 g/dL (ref 6.4–8.3)

## 2015-03-20 MED ORDER — METOCLOPRAMIDE HCL 10 MG PO TABS: 10.0000 mg | ORAL_TABLET | Freq: Three times a day (TID) | ORAL | Status: DC

## 2015-03-20 NOTE — Progress Notes (Signed)
[facility] OFF[facility] PROGRESS NOTE   Diagnosis: Ovarian cancer  INTERVAL HISTORY:   Brianna Vasquez returns as scheduled. Chemotherapy was held last week secondary to neutropenia. She was evaluated on 03/16/2015 with new onset pain at the right flank and right upper abdomen. She was referred for a CT of the abdomen/pelvis on 03/16/2015. This revealed improvement in pulmonary metastases and stable liver lesions. Stable soft tissue fullness was noted in the left pelvis. There was a question of diverticulitis. 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 She had a low-grade fever on 03/16/2015. She denies diarrhea. She was evaluated in emergency room and hospital admission was recommended secondary to neutropenia. She declined hospital admission and was placed on Cipro/Flagyl.  The right abdominal pain has resolved. Brianna Vasquez complains of constant nausea. She has intermittent emesis. She reports limited oral intake secondary to nausea. Zofran and Compazine do not relieve the nausea. Ativan caused an altered mental status. She does not wish to try Ativan again.     Objective:  Vital signs in last 24 hours:  Blood pressure 150/88, pulse 117, temperature 98.3 F (36.8 C), temperature source Oral, resp. rate 18, height $RemoveBe'5\' 5"'vLZWdDtOU$  (1.651 m), weight 158 lb 6.4 oz (71.85 kg), SpO2 98 %.    HEENT: No thrush or ulcers Resp: Lungs clear bilaterally Cardio: Regular rate and rhythm GI: No hepatomegaly, no mass, nontender, slight fullness in the left lower abdomen Vascular: No leg edema     Portacath/PICC-without erythema  Lab Results:  Lab Results  Component Value Date   WBC 6.8 03/20/2015   HGB 11.6 03/20/2015   HCT 35.7 03/20/2015   MCV 89.0 03/20/2015   PLT 358 03/20/2015   NEUTROABS 3.1 03/20/2015      Lab Results  Component Value Date   CEA 83.8* 03/14/2015    Imaging:  Ct Abdomen Pelvis Wo Contrast  03/16/2015   CLINICAL DATA:  Right flank pain since this morning.  History of left ureteric stent and cholecystectomy. Ovarian cancer, surgery 01/03/2015. Status post 2 around chemotherapy.  EXAM: CT ABDOMEN AND PELVIS WITHOUT CONTRAST  TECHNIQUE: Multidetector CT imaging of the abdomen and pelvis was performed following the standard protocol without IV contrast.  COMPARISON:  Diagnostic CT of 01/03/2015.  PET of 02/06/2015.  FINDINGS: Lower chest: Pulmonary nodules which area decreased since 02/06/2015. For example, a lateral right lower lobe 5 mm nodule on image/series 3/4 is decreased from 10 mm on the prior exam (when remeasured). No lobar consolidation. Normal heart size without pericardial or pleural effusion. Small hiatal hernia.  Hepatobiliary: Extensive hepatic metastasis. Suboptimally evaluated on this noncontrast study. Index 2.2 cm medial segment left liver lobe lesion on image/series 12/2 measured 2.2 cm on the prior PET.  A right hepatic lobe 2.4 cm lesion on image/series 20/2 is similar to the prior exam (when remeasured). Cholecystectomy, without biliary ductal dilatation.  Pancreas: Normal, without mass or ductal dilatation.  Spleen: Old granulomatous disease within the spleen.  Adrenals/Urinary Tract: Normal adrenal glands. Moderate left renal cortical thinning with chronic left-sided hydroureteronephrosis. A left-sided ureteric stent originates within the proximal left ureter and terminates within a decompressed urinary bladder. No right-sided hydronephrosis or hydroureter.  Stomach/Bowel: Normal remainder of the stomach. Extensive colonic diverticulosis. Moderate pericolonic edema adjacent the sigmoid, including on image/series 58-60/2. This was similar on the prior PET. No drainable abscess or free perforation. Colonic stool burden suggests constipation. The cecum extends into the pelvis. The appendix is likely identified within the central pelvis on image/series  63/2.  Normal small bowel.  Vascular/Lymphatic: Retroperitoneal adenopathy. Index left periaortic  node measures 12 mm on image/series 31/2. Compare 15 mm on the prior PET. Soft tissue fullness within the high left hemipelvis, posterior to a row of surgical clips could represent adenopathy or residual adnexal mass. This is grossly similar to on the prior exam. Measures on the order of 2.7 cm on image/series 49/2. Normal caliber of the aorta and branch vessels.  Reproductive: Hysterectomy.  Other: No significant free fluid.  Musculoskeletal: No acute osseous abnormality.  IMPRESSION: 1. No right-sided urinary tract calculi or hydronephrosis. Similar appearance of the left-sided urinary tract with ureteric stent in place, chronic hydronephrosis, and chronic cortical thinning. 2. Improvement in imaged pulmonary parenchymal and abdominal nodal metastasis since 02/06/2015. 3. Similar hepatic metastasis since 02/06/2015 4. Edema adjacent the sigmoid colon, suspicious for diverticulitis. Suboptimally evaluated secondary to stone study technique. This edema was present on the prior exam, and could less likely be postoperative. 5. Similar soft tissue fullness within the high left hemipelvis. This could represent residual adnexal mass and/or adenopathy.   Electronically Signed   By: Abigail Miyamoto M.D.   On: 03/16/2015 15:47   Ct Abdomen Pelvis W Contrast  03/17/2015   ADDENDUM REPORT: 03/17/2015 10:35 ADDENDUM: Addendum for addition of findings and impression : Within the right ovarian vein there is a central filling defect demonstrated best on image 42; series 2) most compatible with ovarian vein thrombosis. These results were called by telephone at the time of interpretation on 03/17/2015 at 10:32 am to Dr. Tyrone Nine, who verbally acknowledged these results. Electronically Signed   By: Lovey Newcomer M.D.   On: 03/17/2015 10:35  03/17/2015   CLINICAL DATA:  Patient with right flank pain radiating to the right lower back and down the leg. Patient with history of diverticulosis.  EXAM: CT ABDOMEN AND PELVIS WITH CONTRAST   TECHNIQUE: Multidetector CT imaging of the abdomen and pelvis was performed using the standard protocol following bolus administration of intravenous contrast.  CONTRAST:  173mL OMNIPAQUE IOHEXOL 300 MG/ML  SOLN  COMPARISON:  CT abdomen pelvis 01/03/2015; PET-CT 30 2016; CT 03/16/2015.  FINDINGS: Lower chest: Grossly unchanged 1.7 cm left lower lobe nodule (image 13; series 2). Additional pulmonary nodules have decreased in size including an 8 mm right lower lobe nodule (image 10; series 2), and an 11 mm right lower lobe pulmonary nodule (image 9; series 2), previously measuring 15 mm.  Hepatobiliary: When compared to prior contrast-enhanced CT 01/03/2015 many of hepatic lesions have increased in size however when compared to recent PET-CT many of these lesions appear grossly stable however comparison is difficult given lack of contrast enhancement on prior CT evaluation. Reference lesion within the right hepatic lobe measures 3.2 x 2.6 cm (image 17; series 2), previously 3.0 x 2.4 cm. Status post cholecystectomy. No intrahepatic or extrahepatic biliary ductal dilatation.  Pancreas: Unremarkable  Spleen: Calcified granulomata in the spleen.  Adrenals/Urinary Tract: Normal adrenal glands. The left kidney is atrophic. There is a left-sided double-J nephroureteral stent place. Persistent left-sided hydronephrosis. The urinary bladder is decompressed.  Stomach/Bowel: Extensive sigmoid colonic diverticulosis. There is edema surrounding the sigmoid colon raising the possibility of acute diverticulitis. Additionally within the sigmoid colon there is a 1.5 cm low-attenuation mass (image 70; series 2). The no free fluid or free intraperitoneal air. No evidence for bowel obstruction.  Vascular/Lymphatic: Re- demonstrated multiple enlarged retroperitoneal lymph nodes measuring up to 1.2 cm. Normal caliber abdominal aorta.  Other: Along the  anterior aspect of the left psoas muscle there is an enhancing mass with adjacent  surgical clips. The mass measures approximately 2.6 x 5.8 cm in size.  Musculoskeletal: No aggressive or acute appearing osseous lesions.  IMPRESSION: Sigmoid colonic diverticulosis with fat stranding about the descending colon raising the possibility of acute diverticulitis.  There is enhancing soft tissue mass involving the left psoas muscle high within the left hemipelvis concerning for residual adnexal tumor or metastatic disease.  Innumerable pulmonary nodules improved since 02/06/2015.  Grossly similar hepatic metastasis since 02/06/2015.  Retroperitoneal adenopathy concerning for metastatic disease.  Enhancing mass within the sigmoid colon most suggestive of primary colonic carcinoma. Alternatively this may represent a small intramural abscess in the setting of acute diverticulitis.  Electronically Signed: By: Lovey Newcomer M.D. On: 03/16/2015 20:44    Medications: I have reviewed the patient's current medications.  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 Metastatic left periaortic adenopathy.  PET scan 02/06/2015 with extensive hypermetabolic lymphadenopathy in the chest, abdomen, and pelvis, Residual left pelvic tumor, sigmoid colon lesion, liver/lung metastases, and a left acetabulum metastasis  Colonoscopy 02/09/2015 with a stricture at 30 cm, biopsy positive for adenocarcinoma, no intrinsic colon mass noted  Cycle 1 FOLFOX 02/14/2015  Cycle 2 FOLFOX 02/28/2015  CT 03/16/2015 with improvement in lung metastases, stable liver lesions and left pelvic soft tissue fullness, right ovarian vein thrombosis  2. Placement of a  left ureter stent 01/16/2015  3. Family history of multiple cancers, report of a paternal cousin-BRCA2 positive  4. Upper endoscopy 01/29/2015 with a localized thick gastric fold with a small ulcer  5. Mild swelling of the left foot and ankle 01/27/2015  6. Pain secondary to the left pelvic tumor and acetabulum metastasis  7. Acute/delayed nausea following FOLFOX-Aloxi and amend were added with cycle 2, plan to add prophylactic Decadron will cycle 3  8. Acute transient loss of bladder control with cycle 1 FOLFOX  9. Severe neutropenia following cycle 2 FOLFOX  10. Persistent Nausea-etiology unclear, unlikely related to chemotherapy     Disposition:  Ms. Sweney has completed 2 cycles of FOLFOX. A CT obtained 03/16/2015 for evaluation of right flank/abdominal pain revealed improved lung metastases and stable liver/abdomen soft tissue metastatic disease. The right abdominal pain has resolved.  The plan is to proceed with another cycle of FOLFOX when her nausea has improved. Will follow her clinical status and this CEA closely.  She has persistent nausea that is unlikely related to chemotherapy at this point. She does not wish to try Ativan again. She will begin a trial of Reglan. Ms. Westerfeld will return for an office visit on 03/23/2015. Brianna Coder, MD  03/20/2015  9:24 AM

## 2015-03-20 NOTE — Telephone Encounter (Signed)
Gave and printed appt sched and avs fo rpt for OCT °

## 2015-03-22 ENCOUNTER — Ambulatory Visit: Payer: BLUE CROSS/BLUE SHIELD

## 2015-03-23 ENCOUNTER — Telehealth: Payer: Self-pay | Admitting: Oncology

## 2015-03-23 ENCOUNTER — Telehealth: Payer: Self-pay | Admitting: *Deleted

## 2015-03-23 ENCOUNTER — Other Ambulatory Visit: Payer: Self-pay | Admitting: *Deleted

## 2015-03-23 ENCOUNTER — Ambulatory Visit (HOSPITAL_BASED_OUTPATIENT_CLINIC_OR_DEPARTMENT_OTHER): Payer: BLUE CROSS/BLUE SHIELD | Admitting: Oncology

## 2015-03-23 VITALS — BP 128/78 | HR 118 | Temp 98.3°F | Resp 18 | Ht 65.0 in | Wt 157.2 lb

## 2015-03-23 DIAGNOSIS — C787 Secondary malignant neoplasm of liver and intrahepatic bile duct: Secondary | ICD-10-CM

## 2015-03-23 DIAGNOSIS — C801 Malignant (primary) neoplasm, unspecified: Secondary | ICD-10-CM

## 2015-03-23 DIAGNOSIS — C7951 Secondary malignant neoplasm of bone: Secondary | ICD-10-CM

## 2015-03-23 DIAGNOSIS — C5702 Malignant neoplasm of left fallopian tube: Secondary | ICD-10-CM

## 2015-03-23 DIAGNOSIS — G8918 Other acute postprocedural pain: Secondary | ICD-10-CM

## 2015-03-23 DIAGNOSIS — R110 Nausea: Secondary | ICD-10-CM

## 2015-03-23 DIAGNOSIS — C562 Malignant neoplasm of left ovary: Secondary | ICD-10-CM | POA: Diagnosis not present

## 2015-03-23 DIAGNOSIS — C7800 Secondary malignant neoplasm of unspecified lung: Secondary | ICD-10-CM | POA: Diagnosis not present

## 2015-03-23 MED ORDER — SODIUM CHLORIDE 0.9 % IV SOLN: 1000.0000 mL | Freq: Once | INTRAVENOUS | Status: DC

## 2015-03-23 MED ORDER — OXYCODONE HCL 5 MG PO TABS: ORAL_TABLET | ORAL | Status: DC

## 2015-03-23 NOTE — Telephone Encounter (Signed)
Per staff message and POF I have scheduled appts. Advised scheduler of appts. JMW  

## 2015-03-23 NOTE — Progress Notes (Signed)
  [facility] OFFICE PROGRESS NOTE   Diagnosis: Ovarian cancer  INTERVAL HISTORY:   Brianna Vasquez returns as scheduled. She reports feeling better compared to when I saw her earlier this week. The nausea has improved. She is eating. No fever or diarrhea. She continues to have mid and upper abdominal pain, relieved with oxycodone. The left hip pain has resolved.  Objective:  Vital signs in last 24 hours:  Blood pressure 128/78, pulse 118, temperature 98.3 F (36.8 C), temperature source Oral, resp. rate 18, height _0  (1.651 m), weight 157 lb 3.2 oz (71.305 kg), SpO2 99 %.    HEENT: No thrush or ulcers Resp: Lungs clear bilaterally Cardio: Regular rate and rhythm GI: No hepatomegaly, nontender, no mass Vascular: No leg edema  Skin: Palms without erythema   Portacath/PICC-without erythema  Lab Results:  Lab Results  Component Value Date   WBC 6.8 03/20/2015   HGB 11.6 03/20/2015   HCT 35.7 03/20/2015   MCV 89.0 03/20/2015   PLT 358 03/20/2015   NEUTROABS 3.1 03/20/2015      Lab Results  Component Value Date   CEA 83.8* 03/14/2015     Medications: I have reviewed the patient's current medications.  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 Metastatic left periaortic adenopathy.  PET scan 02/06/2015 with extensive hypermetabolic lymphadenopathy in the chest, abdomen, and pelvis, Residual left pelvic tumor, sigmoid colon lesion, liver/lung metastases, and a left acetabulum metastasis  Colonoscopy 02/09/2015 with a stricture at 30 cm, biopsy positive for adenocarcinoma, no intrinsic colon  mass noted  Cycle 1 FOLFOX 02/14/2015  Cycle 2 FOLFOX 02/28/2015  CT 03/16/2015 with improvement in lung metastases, stable liver lesions and left pelvic soft tissue fullness, right ovarian vein thrombosis  2. Placement of a left ureter stent 01/16/2015  3. Family history of multiple cancers, report of a paternal cousin-BRCA2 positive  4. Upper endoscopy 01/29/2015 with a localized thick gastric fold with a small ulcer  5. Mild swelling of the left foot and ankle 01/27/2015  6. Pain secondary to the left pelvic tumor and acetabulum metastasis  7. Acute/delayed nausea following FOLFOX-Aloxi and amend were added with cycle 2, plan to add prophylactic Decadron will cycle 3  8. Acute transient loss of bladder control with cycle 1 FOLFOX  9. Severe neutropenia following cycle 2 FOLFOX  10. Persistent Nausea following cycle 2 FOLFOX, etiology unclear-improved with Reglan    disposition:  The nausea is much improved. She will continue Reglan. She will return for cycle 3 FOLFOX on 03/26/2015. She will be scheduled for an office visit and another cycle of chemotherapy on 04/10/2015. She will receive Neulasta following chemotherapy. She request IV fluids on day 3 and 5 following chemotherapy.          Betsy Coder, MD  03/23/2015  10:43 AM

## 2015-03-23 NOTE — Telephone Encounter (Signed)
per pof to sch pt appt-sent MW email to sch pt trmt-gave pt avs-adv will call after reply for 10/17 to confirm

## 2015-03-26 ENCOUNTER — Other Ambulatory Visit (HOSPITAL_BASED_OUTPATIENT_CLINIC_OR_DEPARTMENT_OTHER): Payer: BLUE CROSS/BLUE SHIELD

## 2015-03-26 ENCOUNTER — Telehealth: Payer: Self-pay | Admitting: Hematology and Oncology

## 2015-03-26 ENCOUNTER — Telehealth: Payer: Self-pay | Admitting: Nurse Practitioner

## 2015-03-26 ENCOUNTER — Ambulatory Visit (HOSPITAL_BASED_OUTPATIENT_CLINIC_OR_DEPARTMENT_OTHER): Payer: BLUE CROSS/BLUE SHIELD

## 2015-03-26 VITALS — BP 121/57 | HR 110 | Temp 98.4°F | Resp 18

## 2015-03-26 DIAGNOSIS — C562 Malignant neoplasm of left ovary: Secondary | ICD-10-CM | POA: Diagnosis not present

## 2015-03-26 DIAGNOSIS — C5702 Malignant neoplasm of left fallopian tube: Secondary | ICD-10-CM

## 2015-03-26 DIAGNOSIS — C787 Secondary malignant neoplasm of liver and intrahepatic bile duct: Secondary | ICD-10-CM | POA: Diagnosis not present

## 2015-03-26 DIAGNOSIS — G8918 Other acute postprocedural pain: Secondary | ICD-10-CM

## 2015-03-26 DIAGNOSIS — C7951 Secondary malignant neoplasm of bone: Secondary | ICD-10-CM

## 2015-03-26 DIAGNOSIS — C801 Malignant (primary) neoplasm, unspecified: Secondary | ICD-10-CM

## 2015-03-26 DIAGNOSIS — Z1379 Encounter for other screening for genetic and chromosomal anomalies: Secondary | ICD-10-CM | POA: Insufficient documentation

## 2015-03-26 DIAGNOSIS — Z5111 Encounter for antineoplastic chemotherapy: Secondary | ICD-10-CM | POA: Diagnosis not present

## 2015-03-26 DIAGNOSIS — C7800 Secondary malignant neoplasm of unspecified lung: Secondary | ICD-10-CM

## 2015-03-26 LAB — CBC WITH DIFFERENTIAL/PLATELET
BASO%: 1.9 % (ref 0.0–2.0)
BASOS ABS: 0.2 10*3/uL — AB (ref 0.0–0.1)
EOS%: 3.1 % (ref 0.0–7.0)
Eosinophils Absolute: 0.4 10*3/uL (ref 0.0–0.5)
HEMATOCRIT: 35.9 % (ref 34.8–46.6)
HGB: 11.5 g/dL — ABNORMAL LOW (ref 11.6–15.9)
LYMPH#: 2.4 10*3/uL (ref 0.9–3.3)
LYMPH%: 20 % (ref 14.0–49.7)
MCH: 28.8 pg (ref 25.1–34.0)
MCHC: 32.1 g/dL (ref 31.5–36.0)
MCV: 89.8 fL (ref 79.5–101.0)
MONO#: 1.4 10*3/uL — ABNORMAL HIGH (ref 0.1–0.9)
MONO%: 11.9 % (ref 0.0–14.0)
NEUT#: 7.6 10*3/uL — ABNORMAL HIGH (ref 1.5–6.5)
NEUT%: 63.1 % (ref 38.4–76.8)
Platelets: 345 10*3/uL (ref 145–400)
RBC: 3.99 10*6/uL (ref 3.70–5.45)
RDW: 18 % — ABNORMAL HIGH (ref 11.2–14.5)
WBC: 12.1 10*3/uL — ABNORMAL HIGH (ref 3.9–10.3)

## 2015-03-26 MED ORDER — DEXTROSE 5 % IV SOLN
Freq: Once | INTRAVENOUS | Status: AC
  Administered 2015-03-26: 16:00:00 via INTRAVENOUS

## 2015-03-26 MED ORDER — PALONOSETRON HCL INJECTION 0.25 MG/5ML
0.2500 mg | Freq: Once | INTRAVENOUS | Status: AC
  Administered 2015-03-26: 0.25 mg via INTRAVENOUS

## 2015-03-26 MED ORDER — FLUOROURACIL CHEMO INJECTION 2.5 GM/50ML
400.0000 mg/m2 | Freq: Once | INTRAVENOUS | Status: AC
  Administered 2015-03-26: 750 mg via INTRAVENOUS
  Filled 2015-03-26: qty 15

## 2015-03-26 MED ORDER — PALONOSETRON HCL INJECTION 0.25 MG/5ML
INTRAVENOUS | Status: AC
  Filled 2015-03-26: qty 5

## 2015-03-26 MED ORDER — FLUOROURACIL CHEMO INJECTION 5 GM/100ML
2400.0000 mg/m2 | INTRAVENOUS | Status: DC
  Administered 2015-03-26: 4600 mg via INTRAVENOUS
  Filled 2015-03-26: qty 92

## 2015-03-26 MED ORDER — LIDOCAINE-PRILOCAINE 2.5-2.5 % EX CREA
TOPICAL_CREAM | CUTANEOUS | Status: AC
  Filled 2015-03-26: qty 5

## 2015-03-26 MED ORDER — OXALIPLATIN CHEMO INJECTION 100 MG/20ML
85.0000 mg/m2 | Freq: Once | INTRAVENOUS | Status: AC
  Administered 2015-03-26: 165 mg via INTRAVENOUS
  Filled 2015-03-26: qty 33

## 2015-03-26 MED ORDER — SODIUM CHLORIDE 0.9 % IV SOLN
Freq: Once | INTRAVENOUS | Status: AC
  Administered 2015-03-26: 15:00:00 via INTRAVENOUS
  Filled 2015-03-26: qty 5

## 2015-03-26 MED ORDER — DEXTROSE 5 % IV SOLN
400.0000 mg/m2 | Freq: Once | INTRAVENOUS | Status: AC
  Administered 2015-03-26: 768 mg via INTRAVENOUS
  Filled 2015-03-26: qty 38.4

## 2015-03-26 MED ORDER — SODIUM CHLORIDE 0.9 % IV SOLN
Freq: Once | INTRAVENOUS | Status: AC
  Administered 2015-03-26: 15:00:00 via INTRAVENOUS

## 2015-03-26 NOTE — Telephone Encounter (Signed)
error-pt is a Dr Benay Spice pt

## 2015-03-26 NOTE — Progress Notes (Signed)
GENETIC TEST RESULTS  HPI: [name] was previously seen in the [place] clinic due to a personal history of mucinous ovarian cancer, family history of breast, colon, and pancreatic cancers, and concerns regarding a hereditary predisposition to cancer.  There is also a reported paternal family history of a pathogenic BRCA1 mutation in a paternal first cousin.  Please refer to our prior cancer genetics clinic note from February 27, 2015 for more information regarding [name]'s medical, social and family histories, and our assessment and recommendations, at the time. [name] recent genetic test results were disclosed to her, as were recommendations warranted by these results. These results and recommendations are discussed in more detail below.  GENETIC TEST RESULTS: At the time of [name] visit on 02/27/15, we recommended she pursue genetic testing of the 20-gene Breast/Ovarian Cancer Panel through [facility] Brianna Pigeon, MD).  The Breast/Ovarian Cancer Panel offered by [facility] includes sequencing and deletion/duplication analysis for the following 19 genes:  ATM, BARD1, BRCA1, BRCA2, BRIP1, CDH1, CHEK2, FANCC, MLH1, MSH2, MSH6, NBN, PALB2, PMS2, PTEN, RAD51C, RAD51D, TP53, and XRCC2.  This panel also includes deletion/duplication analysis (without sequencing) for one gene, EPCAM.  Those results are now back, the report date for which is March 16, 2015.  Genetic testing was normal, and did not reveal a deleterious mutation in these genes.  Additionally, no variants of uncertain significance (VUSs) were found.  The test report will be scanned into EPIC and will be located under the Results Review tab in the Pathology>Molecular Pathology section.   We discussed with [name] that since the current genetic testing is not perfect, it is possible there may be a gene mutation in one of these genes that current testing cannot detect, but that chance is small. We also  discussed, that it is possible that another gene that has not yet been discovered, or that we have not yet tested, is responsible for the cancer diagnoses in the family, and it is, therefore, important to remain in touch with cancer genetics in the future so that we can continue to offer [name] the most up to date genetic testing.   CANCER SCREENING RECOMMENDATIONS: We still do not have an explanation for [name] ovarian cancer.  Because there is a BRCA1 mutation in the paternal family that likely explains the paternal family history of cancer, this result is reassuring and indicates that [name] likely does not have an increased risk for a future cancer due to a mutation in one of these genes. This normal test also suggests that [name] cancer was most likely not due to an inherited predisposition associated with one of these genes.  Most cancers happen by chance and this negative test suggests that her cancer falls into this category.  We, therefore, recommended she continue to follow the cancer management and screening guidelines provided by her oncology and primary healthcare providers.  However, it could also be the case that we just are not testing the right genes at this point in time.  It would be helpful to verify the presence of the BRCA1 mutation in the paternal family, so that we might explain the paternal family history of cancer and reassure [name] own cancer risks in light of not having inherited this familial mutation.    RECOMMENDATIONS FOR FAMILY MEMBERS: There is likely an explanation for the paternal family history of cancer based on the BRCA1 mutation in the family.  Thus we still do not have  an explanation for [name] ovarian cancer.  Other women in this family, who are not at-risk for the BRCA1 mutation in the paternal family might still be at some increased risk of developing cancer, over the general population risk, simply due to the family history of  cancer. We recommended women in this family have a yearly mammogram beginning at age 53, or 53 years younger than the earliest onset of cancer, an an annual clinical breast exam, and perform monthly breast self-exams.  This includes [name].  Additionally, her Vasquez is considered to be at some increased risk for ovarian cancer based on her mother's family history.  She should make her primary doctor aware of this history, so that she may receive the most appropriate cancer screening.   Women in this family should also have a gynecological exam as recommended by their primary provider. All family members should have a colonoscopy by age 53.  Women in the paternal family, who have inherited the BRCA1 mutation, or who have not yet had genetic testing to determine their mutation status, should be screened according to BRCA mutation-positive screening guidelines.  Based on [name] family history, we recommended her sister, who is currently 53, have genetic counseling and testing. If [name] father inherited the BRCA1 mutation, [name] sister would have a 50% chance of having inherited this same mutation.  [name] will let us know if we can be of any assistance in coordinating genetic counseling and/or testing for her sister.  She reports that her sister's insurance will be changing soon, and that she wants to get this all decided prior to her coming in for genetic counseling and testing.  [name] reports that she will still try to get a copy of her paternal first cousin's positive genetic test report.  FOLLOW-UP: Lastly, we discussed with [name] that cancer genetics is a rapidly advancing field and it is possible that new genetic tests will be appropriate for her and/or her family members in the future. We encouraged her to remain in contact with cancer genetics on an annual basis so we can update her personal and family histories and let her know of advances in cancer  genetics that may benefit this family.   Our contact number was provided. [name] questions were answered to her satisfaction, and she knows she is welcome to call us at anytime with additional questions or concerns.   Jeanine Luz, MS Genetic Counselor [email or web address] Phone: [phone]

## 2015-03-26 NOTE — Telephone Encounter (Signed)
talked w/Tanya (nurse)went over sch-talked with Sharyn Lull she added missing ivf . [place] completed-pt to get updated sch b4 leaving

## 2015-03-26 NOTE — Patient Instructions (Signed)
[facility] Discharge Instructions for Patients Receiving Chemotherapy  Today you received the following chemotherapy agents: Oxaliplatin, Leucovorin, and Adrucil   To help prevent nausea and vomiting after your treatment, we encourage you to take your nausea medication as directed.    If you develop nausea and vomiting that is not controlled by your nausea medication, call the clinic.   BELOW ARE SYMPTOMS THAT SHOULD BE REPORTED IMMEDIATELY:  *FEVER GREATER THAN 100.5 F  *CHILLS WITH OR WITHOUT FEVER  NAUSEA AND VOMITING THAT IS NOT CONTROLLED WITH YOUR NAUSEA MEDICATION  *UNUSUAL SHORTNESS OF BREATH  *UNUSUAL BRUISING OR BLEEDING  TENDERNESS IN MOUTH AND THROAT WITH OR WITHOUT PRESENCE OF ULCERS  *URINARY PROBLEMS  *BOWEL PROBLEMS  UNUSUAL RASH Items with * indicate a potential emergency and should be followed up as soon as possible.  Feel free to call the clinic you have any questions or concerns. The clinic phone number is [phone].  Please show the CHEMO ALERT CARD at check-in to the Emergency Department and triage nurse.   

## 2015-03-28 ENCOUNTER — Other Ambulatory Visit: Payer: BLUE CROSS/BLUE SHIELD

## 2015-03-28 ENCOUNTER — Ambulatory Visit: Payer: BLUE CROSS/BLUE SHIELD

## 2015-03-28 ENCOUNTER — Ambulatory Visit (HOSPITAL_BASED_OUTPATIENT_CLINIC_OR_DEPARTMENT_OTHER): Payer: BLUE CROSS/BLUE SHIELD

## 2015-03-28 ENCOUNTER — Ambulatory Visit: Payer: BLUE CROSS/BLUE SHIELD | Admitting: Oncology

## 2015-03-28 DIAGNOSIS — C5702 Malignant neoplasm of left fallopian tube: Secondary | ICD-10-CM | POA: Diagnosis not present

## 2015-03-28 DIAGNOSIS — C562 Malignant neoplasm of left ovary: Secondary | ICD-10-CM | POA: Diagnosis not present

## 2015-03-28 DIAGNOSIS — C787 Secondary malignant neoplasm of liver and intrahepatic bile duct: Secondary | ICD-10-CM

## 2015-03-28 DIAGNOSIS — R110 Nausea: Secondary | ICD-10-CM

## 2015-03-28 DIAGNOSIS — C801 Malignant (primary) neoplasm, unspecified: Secondary | ICD-10-CM

## 2015-03-28 DIAGNOSIS — C7800 Secondary malignant neoplasm of unspecified lung: Secondary | ICD-10-CM

## 2015-03-28 DIAGNOSIS — C7951 Secondary malignant neoplasm of bone: Secondary | ICD-10-CM

## 2015-03-28 MED ORDER — SODIUM CHLORIDE 0.9 % IJ SOLN
10.0000 mL | INTRAMUSCULAR | Status: DC | PRN
  Administered 2015-03-28: 10 mL
  Filled 2015-03-28: qty 10

## 2015-03-28 MED ORDER — PEGFILGRASTIM INJECTION 6 MG/0.6ML ~~LOC~~
6.0000 mg | PREFILLED_SYRINGE | Freq: Once | SUBCUTANEOUS | Status: AC
  Administered 2015-03-28: 6 mg via SUBCUTANEOUS
  Filled 2015-03-28: qty 0.6

## 2015-03-28 MED ORDER — HEPARIN SOD (PORK) LOCK FLUSH 100 UNIT/ML IV SOLN
500.0000 [IU] | Freq: Once | INTRAVENOUS | Status: AC | PRN
  Administered 2015-03-28: 500 [IU]
  Filled 2015-03-28: qty 5

## 2015-03-28 MED ORDER — SODIUM CHLORIDE 0.9 % IV SOLN
Freq: Once | INTRAVENOUS | Status: AC
  Administered 2015-03-28: 16:00:00 via INTRAVENOUS

## 2015-03-28 NOTE — Patient Instructions (Signed)

## 2015-03-28 NOTE — Progress Notes (Unsigned)
Neulasta injection given by infusion nurse after pump discontinued. 

## 2015-03-30 ENCOUNTER — Ambulatory Visit (HOSPITAL_BASED_OUTPATIENT_CLINIC_OR_DEPARTMENT_OTHER): Payer: BLUE CROSS/BLUE SHIELD

## 2015-03-30 ENCOUNTER — Ambulatory Visit: Payer: BLUE CROSS/BLUE SHIELD

## 2015-03-30 VITALS — BP 94/55 | HR 104 | Temp 97.6°F | Resp 19

## 2015-03-30 DIAGNOSIS — C7800 Secondary malignant neoplasm of unspecified lung: Secondary | ICD-10-CM | POA: Diagnosis not present

## 2015-03-30 DIAGNOSIS — C801 Malignant (primary) neoplasm, unspecified: Secondary | ICD-10-CM

## 2015-03-30 DIAGNOSIS — C562 Malignant neoplasm of left ovary: Secondary | ICD-10-CM

## 2015-03-30 DIAGNOSIS — C7951 Secondary malignant neoplasm of bone: Secondary | ICD-10-CM

## 2015-03-30 DIAGNOSIS — C5702 Malignant neoplasm of left fallopian tube: Secondary | ICD-10-CM | POA: Diagnosis not present

## 2015-03-30 DIAGNOSIS — C787 Secondary malignant neoplasm of liver and intrahepatic bile duct: Secondary | ICD-10-CM

## 2015-03-30 MED ORDER — SODIUM CHLORIDE 0.9 % IV SOLN
Freq: Once | INTRAVENOUS | Status: AC
  Administered 2015-03-30: 14:00:00 via INTRAVENOUS

## 2015-03-30 MED ORDER — SODIUM CHLORIDE 0.9 % IV SOLN
Freq: Once | INTRAVENOUS | Status: AC
  Administered 2015-03-30: 14:00:00 via INTRAVENOUS
  Filled 2015-03-30: qty 4

## 2015-03-30 NOTE — Patient Instructions (Signed)
Dehydration, Adult Dehydration is when you lose more fluids from the body than you take in. Vital organs like the kidneys, brain, and heart cannot function without a proper amount of fluids and salt. Any loss of fluids from the body can cause dehydration.  CAUSES   Vomiting.  Diarrhea.  Excessive sweating.  Excessive urine output.  Fever. SYMPTOMS  Mild dehydration  Thirst.  Dry lips.  Slightly dry mouth. Moderate dehydration  Very dry mouth.  Sunken eyes.  Skin does not bounce back quickly when lightly pinched and released.  Dark urine and decreased urine production.  Decreased tear production.  Headache. Severe dehydration  Very dry mouth.  Extreme thirst.  Rapid, weak pulse (more than 100 beats per minute at rest).  Cold hands and feet.  Not able to sweat in spite of heat and temperature.  Rapid breathing.  Blue lips.  Confusion and lethargy.  Difficulty being awakened.  Minimal urine production.  No tears. DIAGNOSIS  Your caregiver will diagnose dehydration based on your symptoms and your exam. Blood and urine tests will help confirm the diagnosis. The diagnostic evaluation should also identify the cause of dehydration. TREATMENT  Treatment of mild or moderate dehydration can often be done at home by increasing the amount of fluids that you drink. It is best to drink small amounts of fluid more often. Drinking too much at one time can make vomiting worse. Refer to the home care instructions below. Severe dehydration needs to be treated at the hospital where you will probably be given intravenous (IV) fluids that contain water and electrolytes. HOME CARE INSTRUCTIONS   Ask your caregiver about specific rehydration instructions.  Drink enough fluids to keep your urine clear or pale yellow.  Drink small amounts frequently if you have nausea and vomiting.  Eat as you normally do.  Avoid:  Foods or drinks high in sugar.  Carbonated  drinks.  Juice.  Extremely hot or cold fluids.  Drinks with caffeine.  Fatty, greasy foods.  Alcohol.  Tobacco.  Overeating.  Gelatin desserts.  Wash your hands well to avoid spreading bacteria and viruses.  Only take over-the-counter or prescription medicines for pain, discomfort, or fever as directed by your caregiver.  Ask your caregiver if you should continue all prescribed and over-the-counter medicines.  Keep all follow-up appointments with your caregiver. SEEK MEDICAL CARE IF:  You have abdominal pain and it increases or stays in one area (localizes).  You have a rash, stiff neck, or severe headache.  You are irritable, sleepy, or difficult to awaken.  You are weak, dizzy, or extremely thirsty. SEEK IMMEDIATE MEDICAL CARE IF:   You are unable to keep fluids down or you get worse despite treatment.  You have frequent episodes of vomiting or diarrhea.  You have blood or green matter (bile) in your vomit.  You have blood in your stool or your stool looks black and tarry.  You have not urinated in 6 to 8 hours, or you have only urinated a small amount of very dark urine.  You have a fever.  You faint. MAKE SURE YOU:   Understand these instructions.  Will watch your condition.  Will get help right away if you are not doing well or get worse. Document Released: 05/26/2005 Document Revised: 08/18/2011 Document Reviewed: 01/13/2011 ExitCare Patient Information 2015 [employer]. This information is not intended to replace advice given to you by your health care provider. Make sure you discuss any questions you have with your health care   provider.  

## 2015-04-02 ENCOUNTER — Ambulatory Visit (HOSPITAL_BASED_OUTPATIENT_CLINIC_OR_DEPARTMENT_OTHER): Payer: BLUE CROSS/BLUE SHIELD

## 2015-04-02 ENCOUNTER — Telehealth: Payer: Self-pay | Admitting: *Deleted

## 2015-04-02 ENCOUNTER — Other Ambulatory Visit: Payer: Self-pay | Admitting: *Deleted

## 2015-04-02 VITALS — BP 117/63 | HR 97 | Temp 98.6°F | Resp 18

## 2015-04-02 DIAGNOSIS — C562 Malignant neoplasm of left ovary: Secondary | ICD-10-CM

## 2015-04-02 DIAGNOSIS — C7800 Secondary malignant neoplasm of unspecified lung: Secondary | ICD-10-CM

## 2015-04-02 DIAGNOSIS — C787 Secondary malignant neoplasm of liver and intrahepatic bile duct: Secondary | ICD-10-CM | POA: Diagnosis not present

## 2015-04-02 DIAGNOSIS — R5383 Other fatigue: Secondary | ICD-10-CM | POA: Diagnosis not present

## 2015-04-02 DIAGNOSIS — C5702 Malignant neoplasm of left fallopian tube: Secondary | ICD-10-CM | POA: Diagnosis not present

## 2015-04-02 DIAGNOSIS — C7951 Secondary malignant neoplasm of bone: Secondary | ICD-10-CM

## 2015-04-02 MED ORDER — HEPARIN SOD (PORK) LOCK FLUSH 100 UNIT/ML IV SOLN
500.0000 [IU] | Freq: Once | INTRAVENOUS | Status: AC
  Administered 2015-04-02: 500 [IU] via INTRAVENOUS
  Filled 2015-04-02: qty 5

## 2015-04-02 MED ORDER — SODIUM CHLORIDE 0.9 % IV SOLN
INTRAVENOUS | Status: DC
  Administered 2015-04-02: 16:00:00 via INTRAVENOUS

## 2015-04-02 MED ORDER — SODIUM CHLORIDE 0.9 % IJ SOLN
10.0000 mL | INTRAMUSCULAR | Status: DC | PRN
  Administered 2015-04-02: 10 mL via INTRAVENOUS
  Filled 2015-04-02: qty 10

## 2015-04-02 NOTE — Telephone Encounter (Signed)
Received call from pt; reports "my mouth is very tender; having trouble swallowing this weekend and I feel dehydrated"  Pt states she has been able to eat/drink "but am not able to take in a lot"  Pt states she has sore on lip; 3-4 sores in mouth and mouth is very tender; denies fever/n/v.  Per Dr. Benay Spice; notified pt that she can come in at 1530 for IV fluids.  Pt verbalized understanding and expressed appreciation for call.

## 2015-04-03 ENCOUNTER — Other Ambulatory Visit: Payer: Self-pay | Admitting: *Deleted

## 2015-04-03 ENCOUNTER — Telehealth: Payer: Self-pay | Admitting: *Deleted

## 2015-04-03 DIAGNOSIS — R1900 Intra-abdominal and pelvic swelling, mass and lump, unspecified site: Secondary | ICD-10-CM

## 2015-04-03 DIAGNOSIS — C562 Malignant neoplasm of left ovary: Secondary | ICD-10-CM

## 2015-04-03 NOTE — Telephone Encounter (Signed)
Pt called reports "I'm feeling better after fluids yesterday and I want to re-schedule my appt to get treatment every 3 weeks instead of every 2 weeks"  Pt states she "just can't tolerate" every 2 weeks; feels so weak and tired; wants to be able to take treatment and not get "so run down"  Per Dr. Benay Spice; Faythe Ghee to change her to every 3 weeks and receive IVF.  Confirmed with pt that she will keep 04/10/15 appt with lab/Lisa Marcello Moores, NP; request to have chemo 04/16/15 then get pump off 11/9 with IVF and then IVF on Friday the 11th.  Pt verbalized understanding of all information and will wait to hear from schedulers re: time.

## 2015-04-04 ENCOUNTER — Ambulatory Visit (HOSPITAL_BASED_OUTPATIENT_CLINIC_OR_DEPARTMENT_OTHER): Payer: BLUE CROSS/BLUE SHIELD | Admitting: Nurse Practitioner

## 2015-04-04 ENCOUNTER — Other Ambulatory Visit (HOSPITAL_BASED_OUTPATIENT_CLINIC_OR_DEPARTMENT_OTHER): Payer: BLUE CROSS/BLUE SHIELD

## 2015-04-04 ENCOUNTER — Telehealth: Payer: Self-pay | Admitting: *Deleted

## 2015-04-04 VITALS — BP 139/71 | HR 110 | Temp 98.4°F | Resp 18 | Ht 65.0 in | Wt 152.6 lb

## 2015-04-04 DIAGNOSIS — C562 Malignant neoplasm of left ovary: Secondary | ICD-10-CM

## 2015-04-04 DIAGNOSIS — E860 Dehydration: Secondary | ICD-10-CM | POA: Diagnosis not present

## 2015-04-04 DIAGNOSIS — K1230 Oral mucositis (ulcerative), unspecified: Secondary | ICD-10-CM

## 2015-04-04 DIAGNOSIS — T451X5A Adverse effect of antineoplastic and immunosuppressive drugs, initial encounter: Secondary | ICD-10-CM

## 2015-04-04 DIAGNOSIS — R530 Neoplastic (malignant) related fatigue: Secondary | ICD-10-CM

## 2015-04-04 DIAGNOSIS — B3781 Candidal esophagitis: Secondary | ICD-10-CM | POA: Diagnosis not present

## 2015-04-04 DIAGNOSIS — R110 Nausea: Secondary | ICD-10-CM

## 2015-04-04 DIAGNOSIS — D61810 Antineoplastic chemotherapy induced pancytopenia: Secondary | ICD-10-CM

## 2015-04-04 DIAGNOSIS — Z95828 Presence of other vascular implants and grafts: Secondary | ICD-10-CM

## 2015-04-04 LAB — COMPREHENSIVE METABOLIC PANEL (CC13)
ALT: 13 U/L (ref 0–55)
AST: 28 U/L (ref 5–34)
Albumin: 3 g/dL — ABNORMAL LOW (ref 3.5–5.0)
Alkaline Phosphatase: 222 U/L — ABNORMAL HIGH (ref 40–150)
Anion Gap: 11 mEq/L (ref 3–11)
BUN: 8.2 mg/dL (ref 7.0–26.0)
CALCIUM: 9.5 mg/dL (ref 8.4–10.4)
CHLORIDE: 105 meq/L (ref 98–109)
CO2: 23 mEq/L (ref 22–29)
Creatinine: 0.8 mg/dL (ref 0.6–1.1)
EGFR: 84 mL/min/{1.73_m2} — ABNORMAL LOW (ref >90)
Glucose: 65 mg/dl — ABNORMAL LOW (ref 70–140)
POTASSIUM: 3.5 meq/L (ref 3.5–5.1)
Sodium: 138 mEq/L (ref 136–145)
Total Bilirubin: 0.92 mg/dL (ref 0.20–1.20)
Total Protein: 7 g/dL (ref 6.4–8.3)

## 2015-04-04 LAB — CBC WITH DIFFERENTIAL/PLATELET
BASO%: 3.4 % — ABNORMAL HIGH (ref 0.0–2.0)
BASOS ABS: 0.1 10*3/uL (ref 0.0–0.1)
EOS%: 16.3 % — AB (ref 0.0–7.0)
Eosinophils Absolute: 0.5 10*3/uL (ref 0.0–0.5)
HEMATOCRIT: 32.4 % — AB (ref 34.8–46.6)
HGB: 10.5 g/dL — ABNORMAL LOW (ref 11.6–15.9)
LYMPH#: 1.4 10*3/uL (ref 0.9–3.3)
LYMPH%: 49.5 % (ref 14.0–49.7)
MCH: 28.7 pg (ref 25.1–34.0)
MCHC: 32.3 g/dL (ref 31.5–36.0)
MCV: 88.9 fL (ref 79.5–101.0)
MONO#: 0.3 10*3/uL (ref 0.1–0.9)
MONO%: 9.1 % (ref 0.0–14.0)
NEUT#: 0.6 10*3/uL — ABNORMAL LOW (ref 1.5–6.5)
NEUT%: 21.7 % — AB (ref 38.4–76.8)
Platelets: 65 10*3/uL — ABNORMAL LOW (ref 145–400)
RBC: 3.65 10*6/uL — ABNORMAL LOW (ref 3.70–5.45)
RDW: 16.6 % — ABNORMAL HIGH (ref 11.2–14.5)
WBC: 2.8 10*3/uL — ABNORMAL LOW (ref 3.9–10.3)

## 2015-04-04 MED ORDER — SODIUM CHLORIDE 0.9 % IV SOLN
INTRAVENOUS | Status: AC
  Administered 2015-04-04: 15:00:00 via INTRAVENOUS

## 2015-04-04 MED ORDER — MAGIC MOUTHWASH W/LIDOCAINE: 5.0000 mL | Freq: Four times a day (QID) | ORAL | Status: AC | PRN

## 2015-04-04 MED ORDER — SODIUM CHLORIDE 0.9 % IJ SOLN
10.0000 mL | INTRAMUSCULAR | Status: DC | PRN
  Administered 2015-04-04: 10 mL via INTRAVENOUS
  Filled 2015-04-04: qty 10

## 2015-04-04 MED ORDER — FLUCONAZOLE 100 MG PO TABS: 100.0000 mg | ORAL_TABLET | Freq: Every day | ORAL | Status: DC

## 2015-04-04 MED ORDER — HEPARIN SOD (PORK) LOCK FLUSH 100 UNIT/ML IV SOLN
500.0000 [IU] | Freq: Once | INTRAVENOUS | Status: DC
Start: 2015-04-04 — End: 2015-04-06
  Filled 2015-04-04: qty 5

## 2015-04-04 NOTE — Telephone Encounter (Signed)
Patient called with hoarse voice reporting "struggling with raw digestive tract.  Nasal cavity bleeding with drainage down throat.  Was the right nostril and last night it was the left side.  White blisters in my mouth and it's still hard to swallow.  I do not have a fever.  I do not think I need IVF's again but I need to know what to do.  My voice is not normally like this."  Will notify Dr. Benay Spice.  This nurse advised she sit upright, lean forward at neck, holding pressure to nose for 5 to 10 minutes, and FF.  Reports she "didn't sleep well last night because she had to sleep sitting up".  Return number [phone].

## 2015-04-04 NOTE — Telephone Encounter (Signed)
Verbal order received and read back from Dr. Benay Spice for patient to come in today or tomorrow for further assessment.  Called Ms. Mallinger who cannot arrive before the next thirty to forty five minutes.  Will schedule for lab to determine nadir and for [facility].  Will schedule lab at 1300 then [facility].

## 2015-04-06 ENCOUNTER — Telehealth: Payer: Self-pay | Admitting: *Deleted

## 2015-04-06 ENCOUNTER — Encounter: Payer: Self-pay | Admitting: Nurse Practitioner

## 2015-04-06 DIAGNOSIS — R530 Neoplastic (malignant) related fatigue: Secondary | ICD-10-CM | POA: Insufficient documentation

## 2015-04-06 DIAGNOSIS — D61810 Antineoplastic chemotherapy induced pancytopenia: Secondary | ICD-10-CM | POA: Insufficient documentation

## 2015-04-06 DIAGNOSIS — T451X5A Adverse effect of antineoplastic and immunosuppressive drugs, initial encounter: Secondary | ICD-10-CM

## 2015-04-06 DIAGNOSIS — B3781 Candidal esophagitis: Secondary | ICD-10-CM | POA: Insufficient documentation

## 2015-04-06 NOTE — Assessment & Plan Note (Signed)
Patient reports significantly increased fatigue and deconditioning since her last cycle of chemotherapy.  She also feels dehydrated; most likely secondary to her significant mucositis/esophagitis issues.  She states that she has been lying in the bed for the last 3 days.  Long discussion with both patient and her family member regarding the need for increasing her activity level; and deconditioning issues.  Advised patient would order a home health evaluation for in-home physical therapy for strength training.

## 2015-04-06 NOTE — Assessment & Plan Note (Signed)
Patient received her last cycle of chemotherapy on 03/26/2015; and received her last Neulasta injection on 03/28/2015.  She reported to the [facility] today with complaint of mucositis/esophagitis; and was feeling dehydrated.  She will receive IV fluid rehydration while at the cancer Center today.  Blood counts obtained today revealed a WBC of 2.8, ANC 0.6, hemoglobin 10.5, platelet count 65.  Patient denies any recent fevers or chills.  She has had an occasional mild nosebleed; but no other issues with bruising or bleeding.  On exam today.  Patient with fairly significant mucositis/oral lesions to the right oral mucosa; with white plaques.  She also has some mild thrush to her tongue.  Her posterior oropharynx with erythema; and some white patches as well.  Patient does have a very hoarse voice today; and reports that her throat is hurting.  Most likely, patient has a combination of both mucositis and Candida esophagitis.  Will prescribe Diflucan orally; and also prescribed Magic mouthwash with both nystatin and lidocaine.  Patient was encouraged to push fluids at home.

## 2015-04-06 NOTE — Assessment & Plan Note (Signed)
He should received her last cycle of oxaliplatin/Adrucil chemotherapy on 03/26/2015.  She received Neulasta injection for growth factor support on 03/28/2015.  She presented to the [facility] today with complaint of mucositis/esophagitis; and feels dehydrated.  She will receive IV fluid rehydration while at the cancer Center today.  Patient is scheduled to return for labs and a follow-up visit on 04/10/2015.  Patient is scheduled for chemotherapy on 04/16/2015.

## 2015-04-06 NOTE — Progress Notes (Signed)
SYMPTOM MANAGEMENT CLINIC   HPI: Brianna Vasquez 54 y.o. female diagnosed with ovarian cancer.  Currently undergoing oxaliplatin/Adrucil chemotherapy regimen.  Patient received her last cycle of chemotherapy on 03/26/2015; and received her last Neulasta injection on 03/28/2015.  She reported to the [facility] today with complaint of mucositis/esophagitis; and was feeling dehydrated.  She will receive IV fluid rehydration while at the cancer Center today.  Blood counts obtained today revealed a WBC of 2.8, ANC 0.6, hemoglobin 10.5, platelet count 65.  Patient denies any recent fevers or chills.  She has had an occasional mild nosebleed; but no other issues with bruising or bleeding.  On exam today.  Patient with fairly significant mucositis/oral lesions to the right oral mucosa; with white plaques.  She also has some mild thrush to her tongue.  Her posterior oropharynx with erythema; and some white patches as well.  Patient does have a very hoarse voice today; and reports that her throat is hurting.  Most likely, patient has a combination of both mucositis and Candida esophagitis.  Will prescribe Diflucan orally; and also prescribed Magic mouthwash with both nystatin and lidocaine.  Patient was encouraged to push fluids at home.  HPI  ROS  Past Medical History  Diagnosis Date  . Wears glasses   . Diverticulosis     dx 09/2014  . Esophagus hernia     hx of  . GERD (gastroesophageal reflux disease)     once in a while  . Anemia     hx of, none since hysterectomy  . Headache     severe, for last few mornings  . Cancer ([place])     "ovary tumor-on colon, kidney, spots on liver and lungs"  . Anxiety     recent, situational    Past Surgical History  Procedure Laterality Date  . Wisdom tooth extraction  ~2001  . Cholecystectomy  2007    lap choli  . Breast lumpectomy Right 10/2013  . Vaginal hysterectomy  2005  . Colonoscopy  2016  . Laparotomy N/A 01/16/2015    Procedure:  EXPLORATORY LAPAROTOMY ;  Surgeon: Nancy Marus, MD;  Location: [facility];  Service: Gynecology;  Laterality: N/A;  . Oophorectomy Bilateral 01/16/2015    Procedure: BILATERAL OOPHORECTOMY RESECTION OF PELVIC MASS ;  Surgeon: Nancy Marus, MD;  Location: [facility];  Service: Gynecology;  Laterality: Bilateral;  . Cystoscopy w/ ureteral stent placement Left 01/16/2015    Procedure: CYSTOSCOPY WITH RETROGRADE PYELOGRAM/URETERAL STENT PLACEMENT;  Surgeon: Carolan Clines, MD;  Location: [facility];  Service: Urology;  Laterality: Left;  . Porta cath insertion    . Colonoscopy with propofol N/A 02/09/2015    Procedure: COLONOSCOPY WITH PROPOFOL;  Surgeon: Ronald Lobo, MD;  Location: [facility];  Service: Endoscopy;  Laterality: N/A;    has Atypical ductal hyperplasia of right breast; Pelvic mass in female; Ovarian mass; Ovarian cancer on left [facility]); Family history of breast cancer in female; Family history of pancreatic cancer; Family history of colon cancer; Dehydration; Nausea without vomiting; UTI (urinary tract infection); Flank pain; Chemotherapy induced neutropenia ([place]); Genetic testing; Antineoplastic chemotherapy induced pancytopenia ([place]); 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    is allergic to other and darvon.    Medication List       This list is accurate as of: 04/04/15 11:59 PM.  Always use your most recent med list.               ALPRAZolam  0.5 MG tablet  Commonly known as:  XANAX  take 1 tablet by mouth every 8 hours as needed for anxiety     calcium carbonate 500 MG chewable tablet  Commonly known as:  TUMS - dosed in mg elemental calcium  Chew 1-2 tablets by mouth 3 (three) times daily as needed for indigestion or heartburn.     dexamethasone 4 MG tablet  Commonly known as:  DECADRON  TAKE TWO TABS TWICE A DAY FOR TWO DAYS     fluconazole 100 MG tablet  Commonly known as:  DIFLUCAN  Take 1 tablet (100 mg total) by mouth  daily.     gabapentin 300 MG capsule  Commonly known as:  NEURONTIN  Take 2 capsules (600 mg total) by mouth 3 (three) times daily.     lidocaine-prilocaine cream  Commonly known as:  EMLA  Apply to port site one hour prior to use. Do not rub in. Cover with plastic.     LORazepam 0.5 MG tablet  Commonly known as:  ATIVAN  Take 1 tablet (0.5 mg total) by mouth every 6 (six) hours as needed for anxiety (nausea).     magic mouthwash w/lidocaine Soln  Take 5 mLs by mouth 4 (four) times daily as needed for mouth pain ([employer] formula w/ nystatin and lidocaine 1:1.).     metoCLOPramide 10 MG tablet  Commonly known as:  REGLAN  Take 1 tablet (10 mg total) by mouth 3 (three) times daily before meals.     ondansetron 4 MG disintegrating tablet  Commonly known as:  ZOFRAN ODT  Take 1 tablet (4 mg total) by mouth every 8 (eight) hours as needed for nausea or vomiting.     ondansetron 8 MG tablet  Commonly known as:  ZOFRAN  Take 1 tablet (8 mg total) by mouth every 8 (eight) hours as needed for nausea or vomiting.     oxyCODONE 5 MG immediate release tablet  Commonly known as:  Oxy IR/ROXICODONE  Take 1-2 tabs every 4 hours as needed for pain     PRESCRIPTION MEDICATION  Chemo at [place] through pump for 2 days at home and gets pump connected after Chemo IV at [facility] and then comes back to get it disconnected after the 2 days     sucralfate 1 G tablet  Commonly known as:  CARAFATE  Take 1 g by mouth 4 (four) times daily as needed (for stomach ulcers).         PHYSICAL EXAMINATION  Oncology Vitals 04/04/2015 04/02/2015 03/30/2015 03/26/2015 03/23/2015 03/20/2015 03/19/2015  Height 165 cm - - - 165 cm 165 cm -  Weight 69.219 kg - - - 71.305 kg 71.85 kg -  Weight (lbs) 152 lbs 10 oz - - - 157 lbs 3 oz 158 lbs 6 oz -  BMI (kg/m2) 25.39 kg/m2 - - - 26.16 kg/m2 26.36 kg/m2 -  Temp 98.4 98.6 97.6 98.4 98.3 98.3 -  Pulse 110 97 104 110 118 117 96  Resp _0 SpO2  100 100 99 - 99 98 99  BSA (m2) 1.78 m2 - - - 1.81 m2 1.82 m2 -   BP Readings from Last 3 Encounters:  04/04/15 139/71  04/02/15 117/63  03/30/15 94/55    Physical Exam  Constitutional: She is oriented to person, place, and time and well-developed, well-nourished, and in no distress.  HENT:  Head: Normocephalic and atraumatic.  Mouth/Throat: Oropharyngeal exudate present.  On exam  today.  Patient with fairly significant mucositis/oral lesions to the right oral mucosa; with white plaques.  She also has some mild thrush to her tongue.  Her posterior oropharynx with erythema; and some white patches as well.  Patient does have a very hoarse voice today; and reports that her throat is hurting.    Eyes: Conjunctivae and EOM are normal. Pupils are equal, round, and reactive to light. Right eye exhibits no discharge. Left eye exhibits no discharge. No scleral icterus.  Neck: Normal range of motion.  Pulmonary/Chest: Effort normal. No respiratory distress.  Musculoskeletal: Normal range of motion. She exhibits no edema or tenderness.  Neurological: She is alert and oriented to person, place, and time. Gait normal.  Skin: Skin is warm and dry. No rash noted. No erythema. There is pallor.  Psychiatric: Affect normal.  Nursing note and vitals reviewed.   LABORATORY DATA:. Appointment on 04/04/2015  Component Date Value Ref Range Status  . WBC 04/04/2015 2.8* 3.9 - 10.3 10e3/uL Final  . NEUT# 04/04/2015 0.6* 1.5 - 6.5 10e3/uL Final  . HGB 04/04/2015 10.5* 11.6 - 15.9 g/dL Final  . HCT 04/04/2015 32.4* 34.8 - 46.6 % Final  . Platelets 04/04/2015 65 Few Large & giant platelets* 145 - 400 10e3/uL Final  . MCV 04/04/2015 88.9  79.5 - 101.0 fL Final  . MCH 04/04/2015 28.7  25.1 - 34.0 pg Final  . MCHC 04/04/2015 32.3  31.5 - 36.0 g/dL Final  . RBC 04/04/2015 3.65* 3.70 - 5.45 10e6/uL Final  . RDW 04/04/2015 16.6* 11.2 - 14.5 % Final  . lymph# 04/04/2015 1.4  0.9 - 3.3 10e3/uL Final  . MONO#  04/04/2015 0.3  0.1 - 0.9 10e3/uL Final  . Eosinophils Absolute 04/04/2015 0.5  0.0 - 0.5 10e3/uL Final  . Basophils Absolute 04/04/2015 0.1  0.0 - 0.1 10e3/uL Final  . NEUT% 04/04/2015 21.7* 38.4 - 76.8 % Final  . LYMPH% 04/04/2015 49.5  14.0 - 49.7 % Final  . MONO% 04/04/2015 9.1  0.0 - 14.0 % Final  . EOS% 04/04/2015 16.3* 0.0 - 7.0 % Final  . BASO% 04/04/2015 3.4* 0.0 - 2.0 % Final  . Sodium 04/04/2015 138  136 - 145 mEq/L Final  . Potassium 04/04/2015 3.5  3.5 - 5.1 mEq/L Final  . Chloride 04/04/2015 105  98 - 109 mEq/L Final  . CO2 04/04/2015 23  22 - 29 mEq/L Final  . Glucose 04/04/2015 65* 70 - 140 mg/dl Final   Glucose reference range is for nonfasting patients. Fasting glucose reference range is 70- 100.  Marland Kitchen BUN 04/04/2015 8.2  7.0 - 26.0 mg/dL Final  . Creatinine 04/04/2015 0.8  0.6 - 1.1 mg/dL Final  . Total Bilirubin 04/04/2015 0.92  0.20 - 1.20 mg/dL Final  . Alkaline Phosphatase 04/04/2015 222* 40 - 150 U/L Final  . AST 04/04/2015 28  5 - 34 U/L Final  . ALT 04/04/2015 13  0 - 55 U/L Final  . Total Protein 04/04/2015 7.0  6.4 - 8.3 g/dL Final  . Albumin 04/04/2015 3.0* 3.5 - 5.0 g/dL Final  . Calcium 04/04/2015 9.5  8.4 - 10.4 mg/dL Final  . Anion Gap 04/04/2015 11  3 - 11 mEq/L Final  . EGFR 04/04/2015 84* >90 ml/min/1.73 m2 Final   eGFR is calculated using the CKD-EPI Creatinine Equation (2009)     RADIOGRAPHIC STUDIES: No results found.  ASSESSMENT/PLAN:    Ovarian cancer on left He should received her last cycle of oxaliplatin/Adrucil chemotherapy on 03/26/2015.  She received Neulasta injection for growth factor support on 03/28/2015.  She presented to the [facility] today with complaint of mucositis/esophagitis; and feels dehydrated.  She will receive IV fluid rehydration while at the cancer Center today.  Patient is scheduled to return for labs and a follow-up visit on 04/10/2015.  Patient is scheduled for chemotherapy on 04/16/2015.  Antineoplastic  chemotherapy induced pancytopenia [facility]) Patient received her last cycle of chemotherapy on 03/26/2015; and received her last Neulasta injection on 03/28/2015.  She reported to the [facility] today with complaint of mucositis/esophagitis; and was feeling dehydrated.  She will receive IV fluid rehydration while at the cancer Center today.  Blood counts obtained today revealed a WBC of 2.8, ANC 0.6, hemoglobin 10.5, platelet count 65.  Patient denies any recent fevers or chills.  She has had an occasional mild nosebleed; but no other issues with bruising or bleeding.  Carefully reviewed on neutropenic precautions with patient and her family member today.  We'll continue to monitor closely.  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  She reported to the [facility] today with complaint of mucositis/esophagitis; and was feeling dehydrated.  She will receive IV fluid rehydration while at the cancer Center today.  Blood counts obtained today revealed a WBC of 2.8, ANC 0.6, hemoglobin 10.5, platelet count 65.  Patient denies any recent fevers or chills.  She has had an occasional mild nosebleed; but no other issues with bruising or bleeding.  On exam today.  Patient with fairly significant mucositis/oral lesions to the right oral mucosa; with white plaques.  She also has some mild thrush to her tongue.  Her posterior oropharynx with erythema; and some white patches as well.  Patient does have a very hoarse voice today; and reports that her throat is hurting.  Most likely, patient has a combination of both mucositis and Candida esophagitis.  Will prescribe Diflucan orally; and also prescribed Magic mouthwash with both nystatin and lidocaine.  Patient was encouraged to push fluids at home.    Neoplastic malignant related fatigue Patient reports significantly increased fatigue and deconditioning  since her last cycle of chemotherapy.  She also feels dehydrated; most likely secondary to her significant mucositis/esophagitis issues.  She states that she has been lying in the bed for the last 3 days.  Long discussion with both patient and her family member regarding the need for increasing her activity level; and deconditioning issues.  Advised patient would order a home health evaluation for in-home physical therapy for strength training.  Dehydration Patient received her last cycle of chemotherapy on 03/26/2015; and received her last Neulasta injection on 03/28/2015.  She reported to the [facility] today with complaint of mucositis/esophagitis; and was feeling dehydrated.  She will receive IV fluid rehydration while at the cancer Center today.  Blood counts obtained today revealed a WBC of 2.8, ANC 0.6, hemoglobin 10.5, platelet count 65.  Patient denies any recent fevers or chills.  She has had an occasional mild nosebleed; but no other issues with bruising or bleeding.  On exam today.  Patient with fairly significant mucositis/oral lesions to the right oral mucosa; with white plaques.  She also has some mild thrush to her tongue.  Her posterior oropharynx with erythema; and some white patches as well.  Patient does have a very hoarse voice today; and reports that her throat is hurting.  Most likely, patient has a combination of both  mucositis and Candida esophagitis.  Will prescribe Diflucan orally; and also prescribed Magic mouthwash with both nystatin and lidocaine.  Patient was encouraged to push fluids at home.  Patient stated understanding of all instructions; and was in agreement with this plan of care. The patient knows to call the clinic with any problems, questions or concerns.   Review/collaboration with Dr. Benay Spice regarding all aspects of patient's visit today.   Total time spent with patient was 25 minutes;  with greater than 75 percent of that time spent in face to face  counseling regarding patient's symptoms,  and coordination of care and follow up.  Disclaimer:This dictation was prepared with Dragon/digital dictation along with [employer]. Any transcriptional errors that result from this process are unintentional.  Drue Second, NP 04/06/2015

## 2015-04-06 NOTE — Assessment & Plan Note (Signed)
Patient received her last cycle of chemotherapy on 03/26/2015; and received her last Neulasta injection on 03/28/2015.  She reported to the [facility] today with complaint of mucositis/esophagitis; and was feeling dehydrated.  She will receive IV fluid rehydration while at the cancer Center today.  Blood counts obtained today revealed a WBC of 2.8, ANC 0.6, hemoglobin 10.5, platelet count 65.  Patient denies any recent fevers or chills.  She has had an occasional mild nosebleed; but no other issues with bruising or bleeding.  Carefully reviewed on neutropenic precautions with patient and her family member today.  We'll continue to monitor closely.

## 2015-04-06 NOTE — Assessment & Plan Note (Signed)
Patient received her last cycle of chemotherapy on 03/26/2015; and received her last Neulasta injection on 03/28/2015.  She reported to the [facility] today with complaint of mucositis/esophagitis; and was feeling dehydrated.  She will receive IV fluid rehydration while at the cancer Center today.  Blood counts obtained today revealed a WBC of 2.8, ANC 0.6, hemoglobin 10.5, platelet count 65.  Patient denies any recent fevers or chills.  She has had an occasional mild nosebleed; but no other issues with bruising or bleeding.  On exam today.  Patient with fairly significant mucositis/oral lesions to the right oral mucosa; with white plaques.  She also has some mild thrush to her tongue.  Her posterior oropharynx with erythema; and some white patches as well.  Patient does have a very hoarse voice today; and reports that her throat is hurting.  Most likely, patient has a combination of both mucositis and Candida esophagitis.  Will prescribe Diflucan orally; and also prescribed Magic mouthwash with both nystatin and lidocaine.  Patient was encouraged to push fluids at home.

## 2015-04-06 NOTE — Telephone Encounter (Signed)
[place] and talked to [place]. Referral information given and she states they will initiate contact with the patient for the home visit.

## 2015-04-08 ENCOUNTER — Other Ambulatory Visit: Payer: Self-pay | Admitting: Oncology

## 2015-04-10 ENCOUNTER — Telehealth: Payer: Self-pay | Admitting: Oncology

## 2015-04-10 ENCOUNTER — Other Ambulatory Visit (HOSPITAL_BASED_OUTPATIENT_CLINIC_OR_DEPARTMENT_OTHER): Payer: BLUE CROSS/BLUE SHIELD

## 2015-04-10 ENCOUNTER — Ambulatory Visit: Payer: BLUE CROSS/BLUE SHIELD

## 2015-04-10 ENCOUNTER — Ambulatory Visit (HOSPITAL_BASED_OUTPATIENT_CLINIC_OR_DEPARTMENT_OTHER): Payer: BLUE CROSS/BLUE SHIELD | Admitting: Nurse Practitioner

## 2015-04-10 VITALS — BP 121/69 | HR 105 | Temp 98.1°F | Resp 20 | Ht 65.0 in | Wt 152.8 lb

## 2015-04-10 DIAGNOSIS — C5702 Malignant neoplasm of left fallopian tube: Secondary | ICD-10-CM | POA: Diagnosis not present

## 2015-04-10 DIAGNOSIS — C7951 Secondary malignant neoplasm of bone: Secondary | ICD-10-CM

## 2015-04-10 DIAGNOSIS — G8918 Other acute postprocedural pain: Secondary | ICD-10-CM

## 2015-04-10 DIAGNOSIS — C801 Malignant (primary) neoplasm, unspecified: Secondary | ICD-10-CM

## 2015-04-10 DIAGNOSIS — C562 Malignant neoplasm of left ovary: Secondary | ICD-10-CM | POA: Diagnosis not present

## 2015-04-10 DIAGNOSIS — C7800 Secondary malignant neoplasm of unspecified lung: Secondary | ICD-10-CM | POA: Diagnosis not present

## 2015-04-10 DIAGNOSIS — C787 Secondary malignant neoplasm of liver and intrahepatic bile duct: Secondary | ICD-10-CM | POA: Diagnosis not present

## 2015-04-10 DIAGNOSIS — G893 Neoplasm related pain (acute) (chronic): Secondary | ICD-10-CM

## 2015-04-10 DIAGNOSIS — R110 Nausea: Secondary | ICD-10-CM

## 2015-04-10 LAB — COMPREHENSIVE METABOLIC PANEL (CC13)
ALT: 14 U/L (ref 0–55)
ANION GAP: 9 meq/L (ref 3–11)
AST: 36 U/L — ABNORMAL HIGH (ref 5–34)
Albumin: 2.9 g/dL — ABNORMAL LOW (ref 3.5–5.0)
Alkaline Phosphatase: 192 U/L — ABNORMAL HIGH (ref 40–150)
BUN: 7.1 mg/dL (ref 7.0–26.0)
CHLORIDE: 106 meq/L (ref 98–109)
CO2: 27 meq/L (ref 22–29)
Calcium: 9.5 mg/dL (ref 8.4–10.4)
Creatinine: 1 mg/dL (ref 0.6–1.1)
EGFR: 64 mL/min/{1.73_m2} — AB (ref >90)
GLUCOSE: 113 mg/dL (ref 70–140)
POTASSIUM: 3.2 meq/L — AB (ref 3.5–5.1)
SODIUM: 141 meq/L (ref 136–145)
TOTAL PROTEIN: 6.9 g/dL (ref 6.4–8.3)
Total Bilirubin: 0.47 mg/dL (ref 0.20–1.20)

## 2015-04-10 LAB — CBC WITH DIFFERENTIAL/PLATELET
BASO%: 1.4 % (ref 0.0–2.0)
Basophils Absolute: 0.1 10*3/uL (ref 0.0–0.1)
EOS ABS: 0.3 10*3/uL (ref 0.0–0.5)
EOS%: 4.5 % (ref 0.0–7.0)
HCT: 33.1 % — ABNORMAL LOW (ref 34.8–46.6)
HGB: 10.8 g/dL — ABNORMAL LOW (ref 11.6–15.9)
LYMPH%: 29.1 % (ref 14.0–49.7)
MCH: 29.7 pg (ref 25.1–34.0)
MCHC: 32.7 g/dL (ref 31.5–36.0)
MCV: 90.9 fL (ref 79.5–101.0)
MONO#: 1.1 10*3/uL — ABNORMAL HIGH (ref 0.1–0.9)
MONO%: 13.8 % (ref 0.0–14.0)
NEUT%: 51.2 % (ref 38.4–76.8)
NEUTROS ABS: 4 10*3/uL (ref 1.5–6.5)
Platelets: 199 10*3/uL (ref 145–400)
RBC: 3.64 10*6/uL — AB (ref 3.70–5.45)
RDW: 18.7 % — ABNORMAL HIGH (ref 11.2–14.5)
WBC: 7.7 10*3/uL (ref 3.9–10.3)
lymph#: 2.3 10*3/uL (ref 0.9–3.3)

## 2015-04-10 NOTE — Telephone Encounter (Signed)
Gave and printed appt sched and avs fo rpt; for NOV  °

## 2015-04-10 NOTE — Progress Notes (Signed)
  [facility] OFFICE PROGRESS NOTE   Diagnosis:  Ovarian cancer  INTERVAL HISTORY:   Brianna Vasquez returns as scheduled. She completed cycle 3 FOLFOX 03/26/2015. She continues to have intermittent nausea. She takes Zofran with good relief. She developed mouth sores. She was prescribed Magic mouthwash and Diflucan 04/06/2015. The mouth sores have resolved. She has periodic loose stools. No significant diarrhea. Cold sensitivity lasted 7-10 days. No persistent neuropathy symptoms. She continues to have intermittent right-sided abdominal pain. She takes oxycodone about every 4 hours.  Objective:  Vital signs in last 24 hours:  Blood pressure 121/69, pulse 105, temperature 98.1 F (36.7 C), temperature source Oral, resp. rate 20, height $RemoveBe'5\' 5"'jqxyIBjZC$  (1.651 m), weight 152 lb 12.8 oz (69.31 kg), SpO2 100 %.    HEENT: No thrush or ulcers. Resp: Lungs clear bilaterally. Cardio: Regular rate and rhythm. GI: Abdomen soft with mild tenderness over the right medial abdomen. No hepatomegaly. No mass. Bowel sounds active. Vascular: No leg edema. Calves soft and nontender. Neuro: Vibratory sense intact over the fingertips per tuning fork exam.  Skin: No rash. Port-A-Cath without erythema.    Lab Results:  Lab Results  Component Value Date   WBC 7.7 04/10/2015   HGB 10.8* 04/10/2015   HCT 33.1* 04/10/2015   MCV 90.9 04/10/2015   PLT 199 04/10/2015   NEUTROABS 4.0 04/10/2015    Imaging:  No results found.  Medications: I have reviewed the patient's current medications.  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  Metastatic left periaortic adenopathy.  PET scan 02/06/2015 with extensive hypermetabolic lymphadenopathy in the chest, abdomen, and pelvis, Residual left pelvic tumor, sigmoid colon lesion, liver/lung metastases, and a left acetabulum metastasis  Colonoscopy 02/09/2015 with a stricture at 30 cm, biopsy positive for adenocarcinoma, no intrinsic colon mass noted  Cycle 1 FOLFOX 02/14/2015  Cycle 2 FOLFOX 02/28/2015  CT 03/16/2015 with improvement in lung metastases, stable liver lesions and left pelvic soft tissue fullness, right ovarian vein thrombosis  Cycle 3 FOLFOX 03/26/2015 with Neulasta support  2. Placement of a left ureter stent 01/16/2015  3. Family history of multiple cancers, report of a paternal cousin-BRCA2 positive  4. Upper endoscopy 01/29/2015 with a localized thick gastric fold with a small ulcer  5. Mild swelling of the left foot and ankle 01/27/2015  6. Pain secondary to the left pelvic tumor and acetabulum metastasis  7. Acute/delayed nausea following FOLFOX-Aloxi and amend were added with cycle 2, prophylactic Decadron with cycle 3  8. Acute transient loss of bladder control with cycle 1 FOLFOX  9. Severe neutropenia following cycle 2 FOLFOX. Neulasta added beginning with cycle 3.  10. Persistent Nausea-etiology unclear, unlikely related to chemotherapy    Disposition:[name] appears stable. She has completed 3 cycles of FOLFOX. She would like to adjust the treatment schedule to every 3 weeks. She will return for cycle 4 FOLFOX 04/16/2015. We will see her in follow-up prior to cycle 5 on 05/07/2015. She will contact the office in the interim with any problems.  Plan reviewed with Dr. Benay Spice.    Ned Card ANP/GNP-BC   04/10/2015  10:21 AM

## 2015-04-11 ENCOUNTER — Telehealth: Payer: Self-pay | Admitting: *Deleted

## 2015-04-11 DIAGNOSIS — C562 Malignant neoplasm of left ovary: Secondary | ICD-10-CM

## 2015-04-11 LAB — CEA: CEA: 68.6 ng/mL — AB (ref 0.0–5.0)

## 2015-04-11 MED ORDER — ONDANSETRON HCL 8 MG PO TABS: 8.0000 mg | ORAL_TABLET | Freq: Three times a day (TID) | ORAL | Status: DC

## 2015-04-11 NOTE — Telephone Encounter (Signed)
-----   Message from Ladell Pier, MD sent at 04/10/2015  7:31 PM EDT ----- Start kcl 51meq daily, bmet day of chemo.

## 2015-04-11 NOTE — Telephone Encounter (Signed)
Called pt with instructions to take Potassium 20 meq once daily. She voiced understanding, stated she has some in the home. She will begin today. Pt reports persistent nausea. She is taking Zofran Q8 hours for this. Requests new Rx to be sent to pharmacy to reflect she is no longer taking PRN. Reviewed with Dr. Benay Spice: Rx sent to pharmacy.

## 2015-04-11 NOTE — Addendum Note (Signed)
Addended by: Brien Few on: 04/11/2015 12:27 PM   Modules accepted: Orders

## 2015-04-12 ENCOUNTER — Ambulatory Visit: Payer: BLUE CROSS/BLUE SHIELD

## 2015-04-16 ENCOUNTER — Other Ambulatory Visit: Payer: Self-pay | Admitting: *Deleted

## 2015-04-16 ENCOUNTER — Other Ambulatory Visit (HOSPITAL_BASED_OUTPATIENT_CLINIC_OR_DEPARTMENT_OTHER): Payer: BLUE CROSS/BLUE SHIELD

## 2015-04-16 ENCOUNTER — Ambulatory Visit (HOSPITAL_BASED_OUTPATIENT_CLINIC_OR_DEPARTMENT_OTHER): Payer: BLUE CROSS/BLUE SHIELD

## 2015-04-16 VITALS — BP 125/73 | HR 103 | Temp 98.3°F | Resp 20

## 2015-04-16 DIAGNOSIS — C787 Secondary malignant neoplasm of liver and intrahepatic bile duct: Secondary | ICD-10-CM

## 2015-04-16 DIAGNOSIS — C562 Malignant neoplasm of left ovary: Secondary | ICD-10-CM

## 2015-04-16 DIAGNOSIS — C5702 Malignant neoplasm of left fallopian tube: Secondary | ICD-10-CM | POA: Diagnosis not present

## 2015-04-16 DIAGNOSIS — Z5111 Encounter for antineoplastic chemotherapy: Secondary | ICD-10-CM

## 2015-04-16 DIAGNOSIS — C7800 Secondary malignant neoplasm of unspecified lung: Secondary | ICD-10-CM

## 2015-04-16 DIAGNOSIS — G8918 Other acute postprocedural pain: Secondary | ICD-10-CM

## 2015-04-16 DIAGNOSIS — C801 Malignant (primary) neoplasm, unspecified: Secondary | ICD-10-CM

## 2015-04-16 DIAGNOSIS — C7951 Secondary malignant neoplasm of bone: Secondary | ICD-10-CM

## 2015-04-16 LAB — CBC WITH DIFFERENTIAL/PLATELET
BASO%: 2 % (ref 0.0–2.0)
Basophils Absolute: 0.2 10*3/uL — ABNORMAL HIGH (ref 0.0–0.1)
EOS%: 1.5 % (ref 0.0–7.0)
Eosinophils Absolute: 0.1 10*3/uL (ref 0.0–0.5)
HCT: 35.7 % (ref 34.8–46.6)
HGB: 11.4 g/dL — ABNORMAL LOW (ref 11.6–15.9)
LYMPH#: 2.3 10*3/uL (ref 0.9–3.3)
LYMPH%: 25.7 % (ref 14.0–49.7)
MCH: 29.4 pg (ref 25.1–34.0)
MCHC: 31.9 g/dL (ref 31.5–36.0)
MCV: 92 fL (ref 79.5–101.0)
MONO#: 1.3 10*3/uL — AB (ref 0.1–0.9)
MONO%: 14.4 % — ABNORMAL HIGH (ref 0.0–14.0)
NEUT%: 56.4 % (ref 38.4–76.8)
NEUTROS ABS: 5.1 10*3/uL (ref 1.5–6.5)
PLATELETS: 387 10*3/uL (ref 145–400)
RBC: 3.88 10*6/uL (ref 3.70–5.45)
RDW: 20.6 % — ABNORMAL HIGH (ref 11.2–14.5)
WBC: 9 10*3/uL (ref 3.9–10.3)

## 2015-04-16 LAB — BASIC METABOLIC PANEL (CC13)
Anion Gap: 10 mEq/L (ref 3–11)
BUN: 6.1 mg/dL — AB (ref 7.0–26.0)
CHLORIDE: 106 meq/L (ref 98–109)
CO2: 23 mEq/L (ref 22–29)
Calcium: 9.7 mg/dL (ref 8.4–10.4)
Creatinine: 1 mg/dL (ref 0.6–1.1)
EGFR: 66 mL/min/{1.73_m2} — ABNORMAL LOW (ref >90)
Glucose: 99 mg/dl (ref 70–140)
POTASSIUM: 4 meq/L (ref 3.5–5.1)
Sodium: 140 mEq/L (ref 136–145)

## 2015-04-16 MED ORDER — PALONOSETRON HCL INJECTION 0.25 MG/5ML
0.2500 mg | Freq: Once | INTRAVENOUS | Status: AC
  Administered 2015-04-16: 0.25 mg via INTRAVENOUS

## 2015-04-16 MED ORDER — DEXTROSE 5 % IV SOLN
400.0000 mg/m2 | Freq: Once | INTRAVENOUS | Status: AC
  Administered 2015-04-16: 768 mg via INTRAVENOUS
  Filled 2015-04-16: qty 38.4

## 2015-04-16 MED ORDER — PALONOSETRON HCL INJECTION 0.25 MG/5ML
INTRAVENOUS | Status: AC
  Filled 2015-04-16: qty 5

## 2015-04-16 MED ORDER — SODIUM CHLORIDE 0.9 % IV SOLN
Freq: Once | INTRAVENOUS | Status: AC
  Administered 2015-04-16: 14:00:00 via INTRAVENOUS
  Filled 2015-04-16: qty 5

## 2015-04-16 MED ORDER — FLUOROURACIL CHEMO INJECTION 2.5 GM/50ML
400.0000 mg/m2 | Freq: Once | INTRAVENOUS | Status: AC
  Administered 2015-04-16: 750 mg via INTRAVENOUS
  Filled 2015-04-16: qty 15

## 2015-04-16 MED ORDER — OXYCODONE HCL 5 MG PO TABS: ORAL_TABLET | ORAL | Status: DC

## 2015-04-16 MED ORDER — SODIUM CHLORIDE 0.9 % IV SOLN
2400.0000 mg/m2 | INTRAVENOUS | Status: DC
  Administered 2015-04-16: 4600 mg via INTRAVENOUS
  Filled 2015-04-16: qty 92

## 2015-04-16 MED ORDER — ALPRAZOLAM 0.5 MG PO TABS: 0.5000 mg | ORAL_TABLET | Freq: Three times a day (TID) | ORAL | Status: DC | PRN

## 2015-04-16 MED ORDER — DEXTROSE 5 % IV SOLN
Freq: Once | INTRAVENOUS | Status: AC
  Administered 2015-04-16: 14:00:00 via INTRAVENOUS

## 2015-04-16 MED ORDER — OXALIPLATIN CHEMO INJECTION 100 MG/20ML
85.0000 mg/m2 | Freq: Once | INTRAVENOUS | Status: AC
  Administered 2015-04-16: 165 mg via INTRAVENOUS
  Filled 2015-04-16: qty 33

## 2015-04-18 ENCOUNTER — Encounter: Payer: Self-pay | Admitting: *Deleted

## 2015-04-18 ENCOUNTER — Ambulatory Visit: Payer: BLUE CROSS/BLUE SHIELD

## 2015-04-18 ENCOUNTER — Ambulatory Visit (HOSPITAL_BASED_OUTPATIENT_CLINIC_OR_DEPARTMENT_OTHER): Payer: BLUE CROSS/BLUE SHIELD

## 2015-04-18 VITALS — BP 111/52 | HR 106 | Temp 98.8°F | Resp 18

## 2015-04-18 DIAGNOSIS — R110 Nausea: Secondary | ICD-10-CM

## 2015-04-18 DIAGNOSIS — C562 Malignant neoplasm of left ovary: Secondary | ICD-10-CM | POA: Diagnosis not present

## 2015-04-18 DIAGNOSIS — C787 Secondary malignant neoplasm of liver and intrahepatic bile duct: Secondary | ICD-10-CM

## 2015-04-18 DIAGNOSIS — R1900 Intra-abdominal and pelvic swelling, mass and lump, unspecified site: Secondary | ICD-10-CM

## 2015-04-18 DIAGNOSIS — C7800 Secondary malignant neoplasm of unspecified lung: Secondary | ICD-10-CM

## 2015-04-18 DIAGNOSIS — C5702 Malignant neoplasm of left fallopian tube: Secondary | ICD-10-CM | POA: Diagnosis not present

## 2015-04-18 DIAGNOSIS — C7951 Secondary malignant neoplasm of bone: Secondary | ICD-10-CM

## 2015-04-18 MED ORDER — HEPARIN SOD (PORK) LOCK FLUSH 100 UNIT/ML IV SOLN
500.0000 [IU] | Freq: Once | INTRAVENOUS | Status: AC | PRN
  Administered 2015-04-18: 500 [IU]
  Filled 2015-04-18: qty 5

## 2015-04-18 MED ORDER — SODIUM CHLORIDE 0.9 % IV SOLN
INTRAVENOUS | Status: DC
  Administered 2015-04-18: 16:00:00 via INTRAVENOUS

## 2015-04-18 MED ORDER — SODIUM CHLORIDE 0.9 % IJ SOLN
10.0000 mL | INTRAMUSCULAR | Status: DC | PRN
  Administered 2015-04-18: 10 mL
  Filled 2015-04-18: qty 10

## 2015-04-18 MED ORDER — PEGFILGRASTIM INJECTION 6 MG/0.6ML ~~LOC~~
6.0000 mg | PREFILLED_SYRINGE | Freq: Once | SUBCUTANEOUS | Status: AC
  Administered 2015-04-18: 6 mg via SUBCUTANEOUS
  Filled 2015-04-18: qty 0.6

## 2015-04-18 NOTE — Patient Instructions (Signed)

## 2015-04-18 NOTE — Progress Notes (Signed)
Pt's pump d/c'd in infusion room

## 2015-04-20 ENCOUNTER — Ambulatory Visit (HOSPITAL_BASED_OUTPATIENT_CLINIC_OR_DEPARTMENT_OTHER): Payer: BLUE CROSS/BLUE SHIELD

## 2015-04-20 VITALS — BP 94/57 | HR 113 | Temp 98.6°F | Resp 18

## 2015-04-20 DIAGNOSIS — C787 Secondary malignant neoplasm of liver and intrahepatic bile duct: Secondary | ICD-10-CM | POA: Diagnosis not present

## 2015-04-20 DIAGNOSIS — C562 Malignant neoplasm of left ovary: Secondary | ICD-10-CM

## 2015-04-20 DIAGNOSIS — R1900 Intra-abdominal and pelvic swelling, mass and lump, unspecified site: Secondary | ICD-10-CM

## 2015-04-20 DIAGNOSIS — C5702 Malignant neoplasm of left fallopian tube: Secondary | ICD-10-CM | POA: Diagnosis not present

## 2015-04-20 DIAGNOSIS — R110 Nausea: Secondary | ICD-10-CM | POA: Diagnosis not present

## 2015-04-20 DIAGNOSIS — C7951 Secondary malignant neoplasm of bone: Secondary | ICD-10-CM

## 2015-04-20 DIAGNOSIS — C7800 Secondary malignant neoplasm of unspecified lung: Secondary | ICD-10-CM

## 2015-04-20 MED ORDER — SODIUM CHLORIDE 0.9 % IJ SOLN
10.0000 mL | INTRAMUSCULAR | Status: DC | PRN
  Administered 2015-04-20: 10 mL via INTRAVENOUS
  Filled 2015-04-20: qty 10

## 2015-04-20 MED ORDER — SODIUM CHLORIDE 0.9 % IV SOLN
INTRAVENOUS | Status: DC
  Administered 2015-04-20: 12:00:00 via INTRAVENOUS

## 2015-04-20 MED ORDER — HEPARIN SOD (PORK) LOCK FLUSH 100 UNIT/ML IV SOLN
500.0000 [IU] | Freq: Once | INTRAVENOUS | Status: AC
  Administered 2015-04-20: 500 [IU] via INTRAVENOUS
  Filled 2015-04-20: qty 5

## 2015-04-20 NOTE — Patient Instructions (Signed)

## 2015-04-23 ENCOUNTER — Encounter: Payer: Self-pay | Admitting: Nurse Practitioner

## 2015-04-23 ENCOUNTER — Other Ambulatory Visit: Payer: Self-pay | Admitting: Nurse Practitioner

## 2015-04-23 ENCOUNTER — Telehealth: Payer: Self-pay | Admitting: *Deleted

## 2015-04-23 ENCOUNTER — Ambulatory Visit (HOSPITAL_BASED_OUTPATIENT_CLINIC_OR_DEPARTMENT_OTHER): Payer: BLUE CROSS/BLUE SHIELD | Admitting: Nurse Practitioner

## 2015-04-23 ENCOUNTER — Other Ambulatory Visit (HOSPITAL_BASED_OUTPATIENT_CLINIC_OR_DEPARTMENT_OTHER): Payer: BLUE CROSS/BLUE SHIELD

## 2015-04-23 ENCOUNTER — Other Ambulatory Visit: Payer: Self-pay | Admitting: *Deleted

## 2015-04-23 ENCOUNTER — Ambulatory Visit (HOSPITAL_BASED_OUTPATIENT_CLINIC_OR_DEPARTMENT_OTHER): Payer: BLUE CROSS/BLUE SHIELD

## 2015-04-23 VITALS — BP 114/68 | HR 105 | Temp 98.0°F | Resp 18

## 2015-04-23 DIAGNOSIS — C562 Malignant neoplasm of left ovary: Secondary | ICD-10-CM

## 2015-04-23 DIAGNOSIS — B3781 Candidal esophagitis: Secondary | ICD-10-CM | POA: Diagnosis not present

## 2015-04-23 DIAGNOSIS — E860 Dehydration: Secondary | ICD-10-CM

## 2015-04-23 LAB — CBC WITH DIFFERENTIAL/PLATELET
BASO%: 0.1 % (ref 0.0–2.0)
BASOS ABS: 0 10*3/uL (ref 0.0–0.1)
EOS%: 1.5 % (ref 0.0–7.0)
Eosinophils Absolute: 0.1 10*3/uL (ref 0.0–0.5)
HEMATOCRIT: 36.1 % (ref 34.8–46.6)
HGB: 11.3 g/dL — ABNORMAL LOW (ref 11.6–15.9)
LYMPH#: 1.2 10*3/uL (ref 0.9–3.3)
LYMPH%: 28.8 % (ref 14.0–49.7)
MCH: 29.1 pg (ref 25.1–34.0)
MCHC: 31.3 g/dL — AB (ref 31.5–36.0)
MCV: 93.2 fL (ref 79.5–101.0)
MONO#: 0.1 10*3/uL (ref 0.1–0.9)
MONO%: 1.9 % (ref 0.0–14.0)
NEUT#: 2.8 10*3/uL (ref 1.5–6.5)
NEUT%: 67.7 % (ref 38.4–76.8)
PLATELETS: 85 10*3/uL — AB (ref 145–400)
RBC: 3.88 10*6/uL (ref 3.70–5.45)
RDW: 20.5 % — ABNORMAL HIGH (ref 11.2–14.5)
WBC: 4.2 10*3/uL (ref 3.9–10.3)

## 2015-04-23 LAB — COMPREHENSIVE METABOLIC PANEL (CC13)
ALT: 16 U/L (ref 0–55)
ANION GAP: 11 meq/L (ref 3–11)
AST: 39 U/L — ABNORMAL HIGH (ref 5–34)
Albumin: 3.1 g/dL — ABNORMAL LOW (ref 3.5–5.0)
Alkaline Phosphatase: 256 U/L — ABNORMAL HIGH (ref 40–150)
BUN: 12.9 mg/dL (ref 7.0–26.0)
CALCIUM: 9.5 mg/dL (ref 8.4–10.4)
CHLORIDE: 110 meq/L — AB (ref 98–109)
CO2: 21 mEq/L — ABNORMAL LOW (ref 22–29)
Creatinine: 0.7 mg/dL (ref 0.6–1.1)
EGFR: >90 mL/min/{1.73_m2} (ref >90)
Glucose: 93 mg/dl (ref 70–140)
POTASSIUM: 3.8 meq/L (ref 3.5–5.1)
Sodium: 142 mEq/L (ref 136–145)
Total Bilirubin: 1.1 mg/dL (ref 0.20–1.20)
Total Protein: 7.4 g/dL (ref 6.4–8.3)

## 2015-04-23 MED ORDER — SODIUM CHLORIDE 0.9 % IV SOLN
INTRAVENOUS | Status: DC
  Administered 2015-04-23: 14:00:00 via INTRAVENOUS

## 2015-04-23 MED ORDER — SODIUM CHLORIDE 0.9 % IV SOLN
Freq: Once | INTRAVENOUS | Status: DC
  Filled 2015-04-23: qty 4

## 2015-04-23 MED ORDER — HYDROMORPHONE HCL 4 MG/ML IJ SOLN
1.0000 mg | Freq: Once | INTRAMUSCULAR | Status: AC
  Administered 2015-04-23: 1 mg via INTRAVENOUS

## 2015-04-23 MED ORDER — SODIUM CHLORIDE 0.9 % IV SOLN: 1500.0000 mL | Freq: Once | INTRAVENOUS | Status: DC

## 2015-04-23 MED ORDER — SODIUM CHLORIDE 0.9 % IJ SOLN
10.0000 mL | INTRAMUSCULAR | Status: DC | PRN
  Administered 2015-04-23: 10 mL via INTRAVENOUS
  Filled 2015-04-23: qty 10

## 2015-04-23 MED ORDER — SODIUM CHLORIDE 0.9 % IV SOLN
Freq: Once | INTRAVENOUS | Status: DC | PRN
  Administered 2015-04-23: 14:00:00 via INTRAVENOUS

## 2015-04-23 MED ORDER — HEPARIN SOD (PORK) LOCK FLUSH 100 UNIT/ML IV SOLN
500.0000 [IU] | Freq: Once | INTRAVENOUS | Status: AC
  Administered 2015-04-23: 500 [IU] via INTRAVENOUS
  Filled 2015-04-23: qty 5

## 2015-04-23 MED ORDER — HYDROMORPHONE HCL 4 MG/ML IJ SOLN
INTRAMUSCULAR | Status: AC
  Filled 2015-04-23: qty 1

## 2015-04-23 NOTE — Patient Instructions (Signed)
Oral Mucositis Oral mucositis is a mouth condition that may develop from treatments for cancer. With this condition, sores may appear on your lips, gums, tongue, throat, and the top (roof) or bottom (floor) of your mouth. CAUSES Oral mucositis can happen to anyone who is being treated with cancer therapies, including:  Cancer medicines (chemotherapy).  Radiation therapy.  Bone marrow transplants and stem cell transplants. Cancer treatments can damage the lining of the mouth, and that causes this condition. Oral mucositis is not caused by infection. However, the sores can become infected after they form. Infection can make oral mucositis worse. RISK FACTORS This condition is more likely to develop in people:  With poor oral hygiene.  With dental problems or oral diseases.  Who use any tobacco product, including cigarettes, chewing tobacco, and electronic cigarettes.  Who drink alcohol.  Who have other medical conditions, such as diabetes, HIV, AIDS, or kidney disease.  Who do not drink enough clear fluids.  Who wear dentures that do not fit correctly.  Who have cancers that primarily affect the blood.  Who are female. SYMPTOMS Symptoms can vary from mild to severe. Symptoms are usually seen 7-10 days after cancer treatment has started. Symptoms include:  Mouth sores. These sores may bleed.  Color changes inside the mouth. Red, shiny areas may appear.  White patches or pus in the mouth.  Pain in the mouth and throat. This can make it painful to speak.  Dryness and a burning feeling in the mouth.  Saliva that is dry and thick.  Trouble eating, drinking, and swallowing. This can lead to weight loss. DIAGNOSIS This condition can be diagnosed with a physical exam. TREATMENT Treatment depends on the severity of the condition. Oral mucositis often heals on its own. Sometimes, changes in the cancer treatment can help. Treatment may include medicines, such as:  An antibiotic  medicine to fight infection, if present.  Medicine to help the cells in your mouth heal more quickly. Medicine may also be given to help control pain. This may include:  Pain relievers that are swished around in the mouth. These make the mouth numb to ease the pain (topical anesthetics).   Mouth rinses.  Prescribed, medicated gels. The gel coats the mouth. This protects nerve endings and lessens pain.  Narcotic pain medicines. HOME CARE INSTRUCTIONS Medicines  If you were prescribed an antibiotic medicine, finish all of it even if you start to feel better.  Take or apply medicines only as directed by your health care provider. Lifestyle  Keeping your mouth clean and germ-free is important. To maintain good oral hygiene:  Brush your teeth carefully with a soft, nylon-bristled toothbrush at least two times each day. Use a gentle toothpaste. Ask your health care provider for a toothpaste recommendation.  Floss your teeth every day.  Have your teeth cleaned regularly as recommended by your dentist.  Rinse your mouth after every meal or as directed by your health care provider. Do not use mouthwash that contains alcohol. Ask your health care provider for a mouthwash recommendation.  Do not use any tobacco products, including cigarettes, chewing tobacco, and electronic cigarettes. If you need help quitting, ask your health care provider.  Avoid eating:  Hot and spicy foods.  Citrus.  Foods that have sharp edges, such as chips.  Sugary foods, such as candy.  Do not drink alcohol. General Instructions  Clean the sores as directed by your health care provider.  If your lips are dry or cracked, apply a   water-based moisturizer to your lips as needed.  Try sucking on ice chips or sugar-free frozen pops. This may help with pain. This also keeps your mouth moist.  Drink enough fluid to keep your urine clear or pale yellow.  If you are losing weight, talk with your health care  provider.  If you have dentures, take them out often as directed by your health care provider.  Keep all follow-up visits as directed by your health care provider. This is important. SEEK MEDICAL CARE IF:  You have mouth pain or throat pain.  You are having more trouble swallowing.  Your symptoms get worse.  You have new symptoms.  Your pain is not controlled with medicine. SEEK IMMEDIATE MEDICAL CARE IF:  You have a lot of bleeding in your mouth.  You have trouble speaking.  You develop new, open, or draining sores in your mouth.  You cannot swallow solid food or liquids.  You have a fever.   This information is not intended to replace advice given to you by your health care provider. Make sure you discuss any questions you have with your health care provider.   Document Released: 01/10/2011 Document Revised: 10/10/2014 Document Reviewed: 05/22/2014 Elsevier Interactive Patient Education 2016 [employer]  

## 2015-04-23 NOTE — Telephone Encounter (Signed)
Pt called to [facility] stating ongoing issues post chemo last week.  Aryal states " my mouth is covered in blisters and I can't swallow well including my medications and fluid"  " I am using the magic mouthwash 4 times a day but it is not helping ".  Pt is requesting IVF today.  Appointment obtained for IVF today here in this office.  POF sent for appointment with Symptom Management while pt is in infusion.

## 2015-04-23 NOTE — Assessment & Plan Note (Addendum)
Patient received FOLFOX chemotherapy on 04/16/2015.  She received Neulasta for growth factor support on 04/18/2015.  She presented to the [place] [facility] today with complaint of fairly severe mucositis and subsequent dehydration.  She denies any recent fevers or chills.  Blood count obtained today reveal a WBC of 4.2, ANC 2.8, hemoglobin 11.3, and platelet count 85.  Patient states that she bumped her right forearm; has a tiny healing bruise/hematoma.  Carlynn Spry has no other complaints of easy bleeding or bruising.  Patient is scheduled to return for IV fluid rehydration only this coming Wednesday, 04/25/2015 at 2:15 PM.  She will return on 05/07/2015 for labs, visit, and chemotherapy.

## 2015-04-23 NOTE — Assessment & Plan Note (Signed)
She presented to the [facility] today with complaint of fairly severe mucositis and subsequent dehydration.  She denies any recent fevers or chills.  Patient received IV fluid rehydration while at the cancer Center today.  Patient is scheduled to return for IV fluid rehydration only this coming Wednesday, 04/25/2015 at 2:15 PM.  She will return on 05/07/2015 for labs, visit, and chemotherapy.

## 2015-04-23 NOTE — Assessment & Plan Note (Signed)
Patient received FOLFOX chemotherapy on 04/16/2015.  She received Neulasta for growth factor support on 04/18/2015.  She presented to the [facility] today with complaint of fairly severe mucositis and subsequent dehydration.  She also complains of a mildly sore throat.  She denies any recent fevers or chills.  On exam.-Patient has oral lesions to her oral mucosa, and multiple lesions to her upper and lower lips as well as well.  Patient is scheduled to return for IV fluid rehydration only this coming Wednesday, 04/25/2015 at 2:15 PM.  She will return on 05/07/2015 for labs, visit, and chemotherapy.

## 2015-04-23 NOTE — Progress Notes (Signed)
SYMPTOM MANAGEMENT CLINIC   HPI: Brianna Vasquez 54 y.o. female diagnosed with ovarian cancer.  Currently undergoing FOLFOX chemotherapy regimen.  Patient received FOLFOX chemotherapy on 04/16/2015.  She received Neulasta for growth factor support on 04/18/2015.  She presented to the [facility] today with complaint of fairly severe mucositis and subsequent dehydration.  She also complains of a mildly sore throat.  She denies any recent fevers or chills.  On exam.-Patient has oral lesions to her oral mucosa, and multiple lesions to her upper and lower lips as well as well.  Patient is scheduled to return for IV fluid rehydration only this coming Wednesday, 04/25/2015 at 2:15 PM.  She will return on 05/07/2015 for labs, visit, and chemotherapy.   HPI  ROS  Past Medical History  Diagnosis Date  . Wears glasses   . Diverticulosis     dx 09/2014  . Esophagus hernia     hx of  . GERD (gastroesophageal reflux disease)     once in a while  . Anemia     hx of, none since hysterectomy  . Headache     severe, for last few mornings  . Cancer ([place])     "ovary tumor-on colon, kidney, spots on liver and lungs"  . Anxiety     recent, situational    Past Surgical History  Procedure Laterality Date  . Wisdom tooth extraction  ~2001  . Cholecystectomy  2007    lap choli  . Breast lumpectomy Right 10/2013  . Vaginal hysterectomy  2005  . Colonoscopy  2016  . Laparotomy N/A 01/16/2015    Procedure: EXPLORATORY LAPAROTOMY ;  Surgeon: Nancy Marus, MD;  Location: [facility];  Service: Gynecology;  Laterality: N/A;  . Oophorectomy Bilateral 01/16/2015    Procedure: BILATERAL OOPHORECTOMY RESECTION OF PELVIC MASS ;  Surgeon: Nancy Marus, MD;  Location: [facility];  Service: Gynecology;  Laterality: Bilateral;  . Cystoscopy w/ ureteral stent placement Left 01/16/2015    Procedure: CYSTOSCOPY WITH RETROGRADE PYELOGRAM/URETERAL STENT PLACEMENT;  Surgeon: Carolan Clines, MD;  Location: [facility];   Service: Urology;  Laterality: Left;  . Porta cath insertion    . Colonoscopy with propofol N/A 02/09/2015    Procedure: COLONOSCOPY WITH PROPOFOL;  Surgeon: Ronald Lobo, MD;  Location: [place];  Service: Endoscopy;  Laterality: N/A;    has Atypical ductal hyperplasia of right breast; Pelvic mass in female; Ovarian mass; Ovarian cancer on left [facility]); Family history of breast cancer in female; Family history of pancreatic cancer; Family history of colon cancer; Dehydration; Nausea without vomiting; UTI (urinary tract infection); Flank pain; Chemotherapy induced neutropenia ([place]); Genetic testing; Antineoplastic chemotherapy induced pancytopenia ([place]); 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    is allergic to other and darvon.    Medication List       This list is accurate as of: 04/23/15  5:03 PM.  Always use your most recent med list.               ALPRAZolam 0.5 MG tablet  Commonly known as:  XANAX  Take 1 tablet (0.5 mg total) by mouth every 8 (eight) hours as needed. for anxiety     calcium carbonate 500 MG chewable tablet  Commonly known as:  TUMS - dosed in mg elemental calcium  Chew 1-2 tablets by mouth 3 (three) times daily as needed for indigestion or heartburn.     dexamethasone 4 MG tablet  Commonly known as:  DECADRON  TAKE TWO TABS TWICE A DAY FOR TWO DAYS     fluconazole 100 MG tablet  Commonly known as:  DIFLUCAN  Take 1 tablet (100 mg total) by mouth daily.     gabapentin 300 MG capsule  Commonly known as:  NEURONTIN  Take 2 capsules (600 mg total) by mouth 3 (three) times daily.     lidocaine-prilocaine cream  Commonly known as:  EMLA  Apply to port site one hour prior to use. Do not rub in. Cover with plastic.     LORazepam 0.5 MG tablet  Commonly known as:  ATIVAN  Take 1 tablet (0.5 mg total) by mouth every 6 (six) hours as needed for anxiety (nausea).     magic mouthwash w/lidocaine Soln  Take  5 mLs by mouth 4 (four) times daily as needed for mouth pain ([employer] formula w/ nystatin and lidocaine 1:1.).     metoCLOPramide 10 MG tablet  Commonly known as:  REGLAN  Take 1 tablet (10 mg total) by mouth 3 (three) times daily before meals.     ondansetron 4 MG disintegrating tablet  Commonly known as:  ZOFRAN ODT  Take 1 tablet (4 mg total) by mouth every 8 (eight) hours as needed for nausea or vomiting.     ondansetron 8 MG tablet  Commonly known as:  ZOFRAN  Take 1 tablet (8 mg total) by mouth every 8 (eight) hours.     oxyCODONE 5 MG immediate release tablet  Commonly known as:  Oxy IR/ROXICODONE  Take 1-2 tabs every 4 hours as needed for pain     potassium chloride SA 20 MEQ tablet  Commonly known as:  K-DUR,KLOR-CON  Take 1 tablet (20 mEq total) by mouth 2 (two) times daily.     PRESCRIPTION MEDICATION  Chemo at [place] through pump for 2 days at home and gets pump connected after Chemo IV at [facility] and then comes back to get it disconnected after the 2 days     sucralfate 1 G tablet  Commonly known as:  CARAFATE  Take 1 g by mouth 4 (four) times daily as needed (for stomach ulcers).         PHYSICAL EXAMINATION  Oncology Vitals 04/23/2015 04/23/2015  Height - -  Weight - -  Weight (lbs) - -  BMI (kg/m2) - -  Temp - 98  Pulse 105 118  Resp - 18  SpO2 - 100  BSA (m2) - -   BP Readings from Last 2 Encounters:  04/23/15 114/68  04/20/15 94/57    Physical Exam  Constitutional: She is well-developed, well-nourished, and in no distress.  HENT:  Head: Normocephalic and atraumatic.  On exam.-Patient has oral lesions to her oral mucosa, and multiple lesions to her upper and lower lips as well as well.    Nursing note and vitals reviewed.   LABORATORY DATA:. Appointment on 04/23/2015  Component Date Value Ref Range Status  . WBC 04/23/2015 4.2  3.9 - 10.3 10e3/uL Final  . NEUT# 04/23/2015 2.8  1.5 - 6.5 10e3/uL Final  . HGB 04/23/2015 11.3* 11.6 -  15.9 g/dL Final  . HCT 04/23/2015 36.1  34.8 - 46.6 % Final  . Platelets 04/23/2015 85* 145 - 400 10e3/uL Final  . MCV 04/23/2015 93.2  79.5 - 101.0 fL Final  . MCH 04/23/2015 29.1  25.1 - 34.0 pg Final  . MCHC 04/23/2015 31.3* 31.5 - 36.0 g/dL Final  . RBC 04/23/2015 3.88  3.70 - 5.45  10e6/uL Final  . RDW 04/23/2015 20.5* 11.2 - 14.5 % Final  . lymph# 04/23/2015 1.2  0.9 - 3.3 10e3/uL Final  . MONO# 04/23/2015 0.1  0.1 - 0.9 10e3/uL Final  . Eosinophils Absolute 04/23/2015 0.1  0.0 - 0.5 10e3/uL Final  . Basophils Absolute 04/23/2015 0.0  0.0 - 0.1 10e3/uL Final  . NEUT% 04/23/2015 67.7  38.4 - 76.8 % Final  . LYMPH% 04/23/2015 28.8  14.0 - 49.7 % Final  . MONO% 04/23/2015 1.9  0.0 - 14.0 % Final  . EOS% 04/23/2015 1.5  0.0 - 7.0 % Final  . BASO% 04/23/2015 0.1  0.0 - 2.0 % Final  . Sodium 04/23/2015 142  136 - 145 mEq/L Final  . Potassium 04/23/2015 3.8  3.5 - 5.1 mEq/L Final  . Chloride 04/23/2015 110* 98 - 109 mEq/L Final  . CO2 04/23/2015 21* 22 - 29 mEq/L Final  . Glucose 04/23/2015 93  70 - 140 mg/dl Final   Glucose reference range is for nonfasting patients. Fasting glucose reference range is 70- 100.  Marland Kitchen BUN 04/23/2015 12.9  7.0 - 26.0 mg/dL Final  . Creatinine 04/23/2015 0.7  0.6 - 1.1 mg/dL Final  . Total Bilirubin 04/23/2015 1.10  0.20 - 1.20 mg/dL Final  . Alkaline Phosphatase 04/23/2015 256* 40 - 150 U/L Final  . AST 04/23/2015 39* 5 - 34 U/L Final  . ALT 04/23/2015 16  0 - 55 U/L Final  . Total Protein 04/23/2015 7.4  6.4 - 8.3 g/dL Final  . Albumin 04/23/2015 3.1* 3.5 - 5.0 g/dL Final  . Calcium 04/23/2015 9.5  8.4 - 10.4 mg/dL Final  . Anion Gap 04/23/2015 11  3 - 11 mEq/L Final  . EGFR 04/23/2015 >90  >90 ml/min/1.73 m2 Final   eGFR is calculated using the CKD-EPI Creatinine Equation (2009)     RADIOGRAPHIC STUDIES: No results found.  ASSESSMENT/PLAN:    Ovarian cancer on left Patient received FOLFOX chemotherapy on 04/16/2015.  She received Neulasta for  growth factor support on 04/18/2015.  She presented to the [facility] today with complaint of fairly severe mucositis and subsequent dehydration.  She denies any recent fevers or chills.  Blood count obtained today reveal a WBC of 4.2, ANC 2.8, hemoglobin 11.3, and platelet count 85.  Patient states that she bumped her right forearm; has a tiny healing bruise/hematoma.  Carlynn Spry has no other complaints of easy bleeding or bruising.  Patient is scheduled to return for IV fluid rehydration only this coming Wednesday, 04/25/2015 at 2:15 PM.  She will return on 05/07/2015 for labs, visit, and chemotherapy.  Dehydration She presented to the cancer Center today with complaint of fairly severe mucositis and subsequent dehydration.  She denies any recent fevers or chills.  Patient received IV fluid rehydration while at the cancer Center today.  Patient is scheduled to return for IV fluid rehydration only this coming Wednesday, 04/25/2015 at 2:15 PM.  She will return on 05/07/2015 for labs, visit, and chemotherapy.  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  She received Neulasta for growth factor support on 04/18/2015.  She presented to the [facility] today with complaint of fairly severe mucositis and subsequent dehydration.  She also complains of a mildly sore throat.  She denies any recent fevers or chills.  On exam.-Patient has oral lesions to her oral mucosa, and multiple lesions to her upper and lower lips as well as well.  Patient is scheduled to return for IV fluid rehydration  only this coming Wednesday, 04/25/2015 at 2:15 PM.  She will return on 05/07/2015 for labs, visit, and chemotherapy.  Patient stated understanding of all instructions; and was in agreement with this plan of care. The patient knows to call the clinic with any problems, questions or concerns.   Review/collaboration with Dr. Benay Spice regarding all aspects of patient's visit  today.   Total time spent with patient was 25 minutes;  with greater than 75 percent of that time spent in face to face counseling regarding patient's symptoms,  and coordination of care and follow up.  Disclaimer:This dictation was prepared with Dragon/digital dictation along with [employer]. Any transcriptional errors that result from this process are unintentional.  Drue Second, NP 04/23/2015

## 2015-04-25 ENCOUNTER — Ambulatory Visit (HOSPITAL_BASED_OUTPATIENT_CLINIC_OR_DEPARTMENT_OTHER): Payer: BLUE CROSS/BLUE SHIELD | Admitting: Nurse Practitioner

## 2015-04-25 ENCOUNTER — Ambulatory Visit (HOSPITAL_BASED_OUTPATIENT_CLINIC_OR_DEPARTMENT_OTHER): Payer: BLUE CROSS/BLUE SHIELD

## 2015-04-25 VITALS — BP 124/65 | HR 96 | Temp 98.2°F

## 2015-04-25 DIAGNOSIS — C562 Malignant neoplasm of left ovary: Secondary | ICD-10-CM

## 2015-04-25 DIAGNOSIS — E860 Dehydration: Secondary | ICD-10-CM | POA: Diagnosis not present

## 2015-04-25 DIAGNOSIS — R300 Dysuria: Secondary | ICD-10-CM

## 2015-04-25 DIAGNOSIS — B3781 Candidal esophagitis: Secondary | ICD-10-CM

## 2015-04-25 DIAGNOSIS — K1230 Oral mucositis (ulcerative), unspecified: Secondary | ICD-10-CM

## 2015-04-25 MED ORDER — HYDROCODONE-ACETAMINOPHEN 7.5-325 MG/15ML PO SOLN: 15.0000 mL | Freq: Four times a day (QID) | ORAL | Status: DC | PRN

## 2015-04-25 MED ORDER — HYDROMORPHONE HCL 4 MG/ML IJ SOLN
INTRAMUSCULAR | Status: AC
  Filled 2015-04-25: qty 1

## 2015-04-25 MED ORDER — HYDROMORPHONE HCL 4 MG/ML IJ SOLN
1.0000 mg | Freq: Once | INTRAMUSCULAR | Status: AC
  Administered 2015-04-25: 1 mg via INTRAVENOUS

## 2015-04-25 MED ORDER — SODIUM CHLORIDE 0.9 % IV SOLN
INTRAVENOUS | Status: DC
  Administered 2015-04-25: 15:00:00 via INTRAVENOUS

## 2015-04-25 MED ORDER — DEXAMETHASONE 0.5 MG/5ML PO ELIX: ORAL_SOLUTION | ORAL | Status: AC

## 2015-04-26 ENCOUNTER — Encounter (HOSPITAL_COMMUNITY): Payer: Self-pay

## 2015-04-26 ENCOUNTER — Inpatient Hospital Stay (HOSPITAL_COMMUNITY)
Admission: AD | Admit: 2015-04-26 | Discharge: 2015-04-29 | DRG: 809 | Disposition: A | Discharge status: Home / Self Care | Payer: BLUE CROSS/BLUE SHIELD | Source: Ambulatory Visit | Attending: Internal Medicine | Admitting: Internal Medicine

## 2015-04-26 ENCOUNTER — Encounter: Payer: Self-pay | Admitting: Nurse Practitioner

## 2015-04-26 ENCOUNTER — Ambulatory Visit (HOSPITAL_BASED_OUTPATIENT_CLINIC_OR_DEPARTMENT_OTHER): Payer: BLUE CROSS/BLUE SHIELD | Admitting: Nurse Practitioner

## 2015-04-26 VITALS — BP 117/65 | HR 102 | Temp 98.0°F | Resp 18 | Ht 65.0 in | Wt 142.7 lb

## 2015-04-26 DIAGNOSIS — K1230 Oral mucositis (ulcerative), unspecified: Secondary | ICD-10-CM | POA: Diagnosis not present

## 2015-04-26 DIAGNOSIS — Z87891 Personal history of nicotine dependence: Secondary | ICD-10-CM

## 2015-04-26 DIAGNOSIS — C562 Malignant neoplasm of left ovary: Secondary | ICD-10-CM | POA: Diagnosis not present

## 2015-04-26 DIAGNOSIS — K1231 Oral mucositis (ulcerative) due to antineoplastic therapy: Secondary | ICD-10-CM

## 2015-04-26 DIAGNOSIS — R070 Pain in throat: Secondary | ICD-10-CM

## 2015-04-26 DIAGNOSIS — K121 Other forms of stomatitis: Secondary | ICD-10-CM

## 2015-04-26 DIAGNOSIS — C801 Malignant (primary) neoplasm, unspecified: Secondary | ICD-10-CM | POA: Diagnosis not present

## 2015-04-26 DIAGNOSIS — C7951 Secondary malignant neoplasm of bone: Secondary | ICD-10-CM | POA: Diagnosis present

## 2015-04-26 DIAGNOSIS — Z9049 Acquired absence of other specified parts of digestive tract: Secondary | ICD-10-CM

## 2015-04-26 DIAGNOSIS — E860 Dehydration: Secondary | ICD-10-CM | POA: Diagnosis present

## 2015-04-26 DIAGNOSIS — R32 Unspecified urinary incontinence: Secondary | ICD-10-CM

## 2015-04-26 DIAGNOSIS — D61810 Antineoplastic chemotherapy induced pancytopenia: Principal | ICD-10-CM | POA: Diagnosis present

## 2015-04-26 DIAGNOSIS — Z803 Family history of malignant neoplasm of breast: Secondary | ICD-10-CM

## 2015-04-26 DIAGNOSIS — G8918 Other acute postprocedural pain: Secondary | ICD-10-CM

## 2015-04-26 DIAGNOSIS — Z801 Family history of malignant neoplasm of trachea, bronchus and lung: Secondary | ICD-10-CM

## 2015-04-26 DIAGNOSIS — C787 Secondary malignant neoplasm of liver and intrahepatic bile duct: Secondary | ICD-10-CM | POA: Diagnosis present

## 2015-04-26 DIAGNOSIS — R300 Dysuria: Secondary | ICD-10-CM | POA: Insufficient documentation

## 2015-04-26 DIAGNOSIS — Z800 Family history of malignant neoplasm of digestive organs: Secondary | ICD-10-CM

## 2015-04-26 DIAGNOSIS — E876 Hypokalemia: Secondary | ICD-10-CM | POA: Diagnosis present

## 2015-04-26 DIAGNOSIS — R627 Adult failure to thrive: Secondary | ICD-10-CM | POA: Diagnosis present

## 2015-04-26 DIAGNOSIS — C7800 Secondary malignant neoplasm of unspecified lung: Secondary | ICD-10-CM | POA: Diagnosis present

## 2015-04-26 DIAGNOSIS — T451X5A Adverse effect of antineoplastic and immunosuppressive drugs, initial encounter: Secondary | ICD-10-CM | POA: Diagnosis present

## 2015-04-26 DIAGNOSIS — Z90710 Acquired absence of both cervix and uterus: Secondary | ICD-10-CM

## 2015-04-26 DIAGNOSIS — B3781 Candidal esophagitis: Secondary | ICD-10-CM

## 2015-04-26 LAB — URINALYSIS, ROUTINE W REFLEX MICROSCOPIC
Glucose, UA: NEGATIVE mg/dL
Ketones, ur: >80 mg/dL — AB
Nitrite: NEGATIVE
PROTEIN: 30 mg/dL — AB
Specific Gravity, Urine: 1.027 (ref 1.005–1.030)
pH: 6 (ref 5.0–8.0)

## 2015-04-26 LAB — CBC
HEMATOCRIT: 29.1 % — AB (ref 36.0–46.0)
Hemoglobin: 9.3 g/dL — ABNORMAL LOW (ref 12.0–15.0)
MCH: 29.8 pg (ref 26.0–34.0)
MCHC: 32 g/dL (ref 30.0–36.0)
MCV: 93.3 fL (ref 78.0–100.0)
Platelets: 35 10*3/uL — ABNORMAL LOW (ref 150–400)
RBC: 3.12 MIL/uL — AB (ref 3.87–5.11)
RDW: 18.1 % — AB (ref 11.5–15.5)
WBC: 1.4 10*3/uL — AB (ref 4.0–10.5)

## 2015-04-26 LAB — CREATININE, SERUM
Creatinine, Ser: 0.68 mg/dL (ref 0.44–1.00)
GFR calc non Af Amer: >60 mL/min (ref >60)

## 2015-04-26 LAB — URINE MICROSCOPIC-ADD ON

## 2015-04-26 MED ORDER — ONDANSETRON HCL 4 MG PO TABS: 4.0000 mg | ORAL_TABLET | Freq: Four times a day (QID) | ORAL | Status: DC | PRN

## 2015-04-26 MED ORDER — PANTOPRAZOLE SODIUM 40 MG IV SOLR
40.0000 mg | INTRAVENOUS | Status: DC
  Administered 2015-04-26 – 2015-04-29 (×3): 40 mg via INTRAVENOUS
  Filled 2015-04-26 (×3): qty 40

## 2015-04-26 MED ORDER — SODIUM CHLORIDE 0.9 % IJ SOLN
10.0000 mL | INTRAMUSCULAR | Status: DC | PRN
  Administered 2015-04-26: 10 mL via INTRAVENOUS
  Filled 2015-04-26: qty 10

## 2015-04-26 MED ORDER — SODIUM CHLORIDE 0.9 % IV SOLN
INTRAVENOUS | Status: DC
  Administered 2015-04-26 – 2015-04-29 (×3): via INTRAVENOUS

## 2015-04-26 MED ORDER — HYDROMORPHONE HCL 1 MG/ML IJ SOLN: 0.5000 mg | INTRAMUSCULAR | Status: DC | PRN

## 2015-04-26 MED ORDER — ONDANSETRON HCL 8 MG PO TABS: 8.0000 mg | ORAL_TABLET | Freq: Three times a day (TID) | ORAL | Status: DC

## 2015-04-26 MED ORDER — ONDANSETRON HCL 4 MG/2ML IJ SOLN: 4.0000 mg | Freq: Four times a day (QID) | INTRAMUSCULAR | Status: DC | PRN

## 2015-04-26 MED ORDER — HYDROMORPHONE HCL 4 MG/ML IJ SOLN
1.0000 mg | Freq: Once | INTRAMUSCULAR | Status: AC
Start: 2015-04-26 — End: 2015-04-26
  Administered 2015-04-26: 1 mg via INTRAVENOUS

## 2015-04-26 MED ORDER — HYDROMORPHONE HCL 1 MG/ML IJ SOLN
1.0000 mg | INTRAMUSCULAR | Status: DC | PRN
  Administered 2015-04-26 – 2015-04-29 (×8): 1 mg via INTRAVENOUS
  Filled 2015-04-26 (×8): qty 1

## 2015-04-26 MED ORDER — FLUCONAZOLE IN SODIUM CHLORIDE 100-0.9 MG/50ML-% IV SOLN
100.0000 mg | INTRAVENOUS | Status: DC
  Administered 2015-04-26 – 2015-04-29 (×4): 100 mg via INTRAVENOUS
  Filled 2015-04-26 (×4): qty 50

## 2015-04-26 MED ORDER — ONDANSETRON 4 MG PO TBDP: 4.0000 mg | ORAL_TABLET | Freq: Three times a day (TID) | ORAL | Status: DC | PRN

## 2015-04-26 MED ORDER — ZOLPIDEM TARTRATE 5 MG PO TABS
5.0000 mg | ORAL_TABLET | Freq: Every evening | ORAL | Status: DC | PRN
  Administered 2015-04-26 – 2015-04-28 (×3): 5 mg via ORAL
  Filled 2015-04-26 (×3): qty 1

## 2015-04-26 MED ORDER — HYDROMORPHONE HCL 4 MG/ML IJ SOLN
INTRAMUSCULAR | Status: AC
  Filled 2015-04-26: qty 1

## 2015-04-26 MED ORDER — ENOXAPARIN SODIUM 40 MG/0.4ML ~~LOC~~ SOLN
40.0000 mg | SUBCUTANEOUS | Status: DC
  Administered 2015-04-26 – 2015-04-27 (×2): 40 mg via SUBCUTANEOUS
  Filled 2015-04-26 (×2): qty 0.4

## 2015-04-26 MED ORDER — MAGIC MOUTHWASH
10.0000 mL | Freq: Four times a day (QID) | ORAL | Status: DC
  Administered 2015-04-26 – 2015-04-27 (×3): 10 mL via ORAL
  Filled 2015-04-26 (×7): qty 10

## 2015-04-26 NOTE — Assessment & Plan Note (Signed)
Patient received cycle 4 FOLFOX chemotherapy on 04/16/2015.  She received Neulasta for growth factor support on 04/18/2015.  She presented to the [facility] today with complaint of fairly severe mucositis and subsequent dehydration.  She denies any recent fevers or chills.  Patient will receive IV fluid rehydration while the cancer Center today.  Per Dr. Allyn Kenner plan is for the patient to receive one additional cycle of chemotherapy prior to restaging scans.  She will return on 05/07/2015 for labs, visit, and chemotherapy.

## 2015-04-26 NOTE — Progress Notes (Signed)
SYMPTOM MANAGEMENT CLINIC   HPI: Brianna Vasquez 54 y.o. female diagnosed with ovarian cancer.  Currently undergoing FOLFOX chemotherapy regimen.  Patient received FOLFOX chemotherapy on 04/16/2015.  She received Neulasta for growth factor support on 04/18/2015.  Patient received FOLFOX chemotherapy on 04/16/2015.  She received Neulasta for growth factor support on 04/18/2015.  She presented to the [facility] today with complaint of fairly severe mucositis and subsequent dehydration.  She also complains of a significant sore throat.  She states that she has been unable to take in any solid foods since this past weekend; and is now unable to swallow her pain medication or even water.  She denies any recent fevers or chills.  HPI  ROS  Past Medical History  Diagnosis Date  . Wears glasses   . Diverticulosis     dx 09/2014  . Esophagus hernia     hx of  . GERD (gastroesophageal reflux disease)     once in a while  . Anemia     hx of, none since hysterectomy  . Headache     severe, for last few mornings  . Cancer ([place])     "ovary tumor-on colon, kidney, spots on liver and lungs"  . Anxiety     recent, situational    Past Surgical History  Procedure Laterality Date  . Wisdom tooth extraction  ~2001  . Cholecystectomy  2007    lap choli  . Breast lumpectomy Right 10/2013  . Vaginal hysterectomy  2005  . Colonoscopy  2016  . Laparotomy N/A 01/16/2015    Procedure: EXPLORATORY LAPAROTOMY ;  Surgeon: Nancy Marus, MD;  Location: [facility];  Service: Gynecology;  Laterality: N/A;  . Oophorectomy Bilateral 01/16/2015    Procedure: BILATERAL OOPHORECTOMY RESECTION OF PELVIC MASS ;  Surgeon: Nancy Marus, MD;  Location: [facility];  Service: Gynecology;  Laterality: Bilateral;  . Cystoscopy w/ ureteral stent placement Left 01/16/2015    Procedure: CYSTOSCOPY WITH RETROGRADE PYELOGRAM/URETERAL STENT PLACEMENT;  Surgeon: Carolan Clines, MD;  Location: [facility];  Service: Urology;   Laterality: Left;  . Porta cath insertion    . Colonoscopy with propofol N/A 02/09/2015    Procedure: COLONOSCOPY WITH PROPOFOL;  Surgeon: Ronald Lobo, MD;  Location: [facility];  Service: Endoscopy;  Laterality: N/A;    has Atypical ductal hyperplasia of right breast; Pelvic mass in female; Ovarian mass; Ovarian cancer on left [facility]); Family history of breast cancer in female; Family history of pancreatic cancer; Family history of colon cancer; Dehydration; Nausea without vomiting; UTI (urinary tract infection); Flank pain; Chemotherapy induced neutropenia ([place]); Genetic testing; Antineoplastic chemotherapy induced pancytopenia ([place]); Candidal esophagitis ([place]); Neoplastic malignant related fatigue; Mucositis; and Dysuria on her problem list.    is allergic to other; ativan; and darvon.    Medication List       This list is accurate as of: 04/26/15  1:42 PM.  Always use your most recent med list.               ALPRAZolam 0.5 MG tablet  Commonly known as:  XANAX  Take 1 tablet (0.5 mg total) by mouth every 8 (eight) hours as needed. for anxiety     calcium carbonate 500 MG chewable tablet  Commonly known as:  TUMS - dosed in mg elemental calcium  Chew 1-2 tablets by mouth 3 (three) times daily as needed for indigestion or heartburn.     dexamethasone 0.5 MG/5ML elixir  Take 5 mls PO QID PRN mucositis.  fluconazole 100 MG tablet  Commonly known as:  DIFLUCAN  Take 1 tablet (100 mg total) by mouth daily.     gabapentin 300 MG capsule  Commonly known as:  NEURONTIN  Take 2 capsules (600 mg total) by mouth 3 (three) times daily.     HYDROcodone-acetaminophen 7.5-325 mg/15 ml solution  Commonly known as:  HYCET  Take 15 mLs by mouth every 6 (six) hours as needed for moderate pain.     lidocaine-prilocaine cream  Commonly known as:  EMLA  Apply to port site one hour prior to use. Do not rub in. Cover with plastic.     magic mouthwash w/lidocaine Soln  Take 5 mLs by  mouth 4 (four) times daily as needed for mouth pain ([employer] formula w/ nystatin and lidocaine 1:1.).     metoCLOPramide 10 MG tablet  Commonly known as:  REGLAN  Take 1 tablet (10 mg total) by mouth 3 (three) times daily before meals.     ondansetron 4 MG disintegrating tablet  Commonly known as:  ZOFRAN ODT  Take 1 tablet (4 mg total) by mouth every 8 (eight) hours as needed for nausea or vomiting.     ondansetron 8 MG tablet  Commonly known as:  ZOFRAN  Take 1 tablet (8 mg total) by mouth every 8 (eight) hours.     oxyCODONE 5 MG immediate release tablet  Commonly known as:  Oxy IR/ROXICODONE  Take 1-2 tabs every 4 hours as needed for pain     potassium chloride SA 20 MEQ tablet  Commonly known as:  K-DUR,KLOR-CON  Take 1 tablet (20 mEq total) by mouth 2 (two) times daily.     POTASSIUM PO  Take 1 tablet by mouth daily.     PRESCRIPTION MEDICATION  Chemo at [place] through pump for 2 days at home and gets pump connected after Chemo IV at [facility] and then comes back to get it disconnected after the 2 days     prochlorperazine 10 MG tablet  Commonly known as:  COMPAZINE  Take 10 mg by mouth daily as needed for nausea.     sucralfate 1 G tablet  Commonly known as:  CARAFATE  Take 1 g by mouth 4 (four) times daily as needed (for stomach ulcers).         PHYSICAL EXAMINATION  Oncology Vitals 04/26/2015 04/26/2015  Height 165 cm 165 cm  Weight 63.957 kg 64.728 kg  Weight (lbs) 141 lbs 142 lbs 11 oz  BMI (kg/m2) 23.46 kg/m2 23.75 kg/m2  Temp 97.5 98  Pulse 91 102  Resp 18 18  SpO2 100 100  BSA (m2) 1.71 m2 1.72 m2   BP Readings from Last 2 Encounters:  04/26/15 112/68  04/26/15 117/65    Physical Exam  Constitutional: She is well-developed, well-nourished, and in no distress.  HENT:  Head: Normocephalic and atraumatic.  On exam.-Patient has oral lesions to her oral mucosa, and multiple lesions to her upper and lower lips as well as well.    Nursing note  and vitals reviewed.   LABORATORY DATA:. No visits with results within 3 Day(s) from this visit. Latest known visit with results is:  Appointment on 04/23/2015  Component Date Value Ref Range Status  . WBC 04/23/2015 4.2  3.9 - 10.3 10e3/uL Final  . NEUT# 04/23/2015 2.8  1.5 - 6.5 10e3/uL Final  . HGB 04/23/2015 11.3* 11.6 - 15.9 g/dL Final  . HCT 04/23/2015 36.1  34.8 - 46.6 % Final  .  Platelets 04/23/2015 85* 145 - 400 10e3/uL Final  . MCV 04/23/2015 93.2  79.5 - 101.0 fL Final  . MCH 04/23/2015 29.1  25.1 - 34.0 pg Final  . MCHC 04/23/2015 31.3* 31.5 - 36.0 g/dL Final  . RBC 04/23/2015 3.88  3.70 - 5.45 10e6/uL Final  . RDW 04/23/2015 20.5* 11.2 - 14.5 % Final  . lymph# 04/23/2015 1.2  0.9 - 3.3 10e3/uL Final  . MONO# 04/23/2015 0.1  0.1 - 0.9 10e3/uL Final  . Eosinophils Absolute 04/23/2015 0.1  0.0 - 0.5 10e3/uL Final  . Basophils Absolute 04/23/2015 0.0  0.0 - 0.1 10e3/uL Final  . NEUT% 04/23/2015 67.7  38.4 - 76.8 % Final  . LYMPH% 04/23/2015 28.8  14.0 - 49.7 % Final  . MONO% 04/23/2015 1.9  0.0 - 14.0 % Final  . EOS% 04/23/2015 1.5  0.0 - 7.0 % Final  . BASO% 04/23/2015 0.1  0.0 - 2.0 % Final  . Sodium 04/23/2015 142  136 - 145 mEq/L Final  . Potassium 04/23/2015 3.8  3.5 - 5.1 mEq/L Final  . Chloride 04/23/2015 110* 98 - 109 mEq/L Final  . CO2 04/23/2015 21* 22 - 29 mEq/L Final  . Glucose 04/23/2015 93  70 - 140 mg/dl Final   Glucose reference range is for nonfasting patients. Fasting glucose reference range is 70- 100.  Marland Kitchen BUN 04/23/2015 12.9  7.0 - 26.0 mg/dL Final  . Creatinine 04/23/2015 0.7  0.6 - 1.1 mg/dL Final  . Total Bilirubin 04/23/2015 1.10  0.20 - 1.20 mg/dL Final  . Alkaline Phosphatase 04/23/2015 256* 40 - 150 U/L Final  . AST 04/23/2015 39* 5 - 34 U/L Final  . ALT 04/23/2015 16  0 - 55 U/L Final  . Total Protein 04/23/2015 7.4  6.4 - 8.3 g/dL Final  . Albumin 04/23/2015 3.1* 3.5 - 5.0 g/dL Final  . Calcium 04/23/2015 9.5  8.4 - 10.4 mg/dL Final  .  Anion Gap 04/23/2015 11  3 - 11 mEq/L Final  . EGFR 04/23/2015 >90  >90 ml/min/1.73 m2 Final   eGFR is calculated using the CKD-EPI Creatinine Equation (2009)     RADIOGRAPHIC STUDIES: No results found.  ASSESSMENT/PLAN:    Ovarian cancer on left Patient received cycle 4 FOLFOX chemotherapy on 04/16/2015.  She received Neulasta for growth factor support on 04/18/2015.  She presented to the [facility] today with complaint of fairly severe mucositis and subsequent dehydration.  She denies any recent fevers or chills. She has been receiving IV fluid rehydration almost every day this week; but continues with the same complaints.  She will be direct admitted to the hospital today for further evaluation and treatment.  Per Dr. Allyn Kenner plan is for the patient to receive one additional cycle of chemotherapy prior to restaging scans.  She will return on 05/07/2015 for labs, visit, and chemotherapy.      Dehydration She presented to the cancer Center today with complaint of fairly severe mucositis and subsequent dehydration.  She denies any recent fevers or chills.  Patient has been receiving IV fluid rehydration almost every day this week; but continues to feel dehydrated due to minimal oral intake.  Patient will be admitted to the hospital for further evaluation and management.       Chemotherapy induced mucositis ([place]) Patient received FOLFOX chemotherapy on 04/16/2015.  She received Neulasta for growth factor support on 04/18/2015.  She presented to the [facility] today with complaint of fairly severe mucositis and subsequent dehydration.  She  also complains of a significant sore throat.  She states that she has been unable to take in any solid foods since this past weekend; and is now unable to swallow her pain medication or even water.  She denies any recent fevers or chills.  On exam.-Patient has oral lesions to her oral mucosa, and multiple lesions to her upper and  lower lips as well as well.  It also appears that she has some thrush to her tongue.  Patient was prescribed Hycocet liquid pain medication; as well as dexamethasone elixir for a swish and swallow yesterday late afternoon.-But states that the pain medication made little difference.  She does feel that the dexamethasone elixir helped somewhat.    Due to patient's continued poor oral intake, mucositis/esophagitis, inability to take oral pain medications.-Patient will be direct admitted to the hospital via the hospitalist program.  Brief history.  Report were given to Dr. Erlinda Hong prior to patient being transported to the emergency department via wheelchair.  Per the cancer Center nurse.       Dysuria Patient reports an approximately 24-hour history of dysuria and urinary incontinence.  She states that she is wearing a pad since she is constantly leaking urine at this time.  She denies any hematuria, fever, or chills.  She states that she is unable to give a urine sample; because she is continually leaking urine.  Advised hospitalist Dr. Erlinda Hong; as well as floor nurse, Jenny Reichmann of the need to collect a urine and a urine culture for further evaluation of possible UTI.  Of note-patient denies any numbness, tingling, or weakness to her back or lower extremities.   Patient stated understanding of all instructions; and was in agreement with this plan of care. The patient knows to call the clinic with any problems, questions or concerns.   This was a shared visit with Dr. Benay Spice today.  Total time spent with patient was 25 minutes;  with greater than 75 percent of that time spent in face to face counseling regarding patient's symptoms,  and coordination of care and follow up.  Disclaimer:This dictation was prepared with Dragon/digital dictation along with [employer]. Any transcriptional errors that result from this process are unintentional.  Drue Second, NP 04/26/2015   This was a shared  visit with Drue Second. Ms. Acocella was interviewed and examined. She has developed severe mucositis following cycle 4 FOLFOX. She is now day 11 following chemotherapy. She will be admitted for intravenous hydration and pain control.  The 5-FU will be dose reduced with the next cycle of chemotherapy. The plan is to schedule a restaging CT evaluation after one to 2 more cycles of chemotherapy.  Julieanne Manson, M.D.

## 2015-04-26 NOTE — Assessment & Plan Note (Signed)
Patient received FOLFOX chemotherapy on 04/16/2015.  She received Neulasta for growth factor support on 04/18/2015.  She presented to the [facility] today with complaint of fairly severe mucositis and subsequent dehydration.  She also complains of a significant sore throat.  She states that she has been unable to take in any solid foods since this past weekend; and is now unable to swallow her pain medication or even water.  She denies any recent fevers or chills.  On exam.-Patient has oral lesions to her oral mucosa, and multiple lesions to her upper and lower lips as well as well.  It also appears that she has some thrush to her tongue.  Patient was prescribed Hycocet liquid pain medication; as well as dexamethasone elixir for a swish and swallow yesterday late afternoon.-But states that the pain medication made little difference.  She does feel that the dexamethasone elixir helped somewhat.    Due to patient's continued poor oral intake, mucositis/esophagitis, inability to take oral pain medications.-Patient will be direct admitted to the hospital via the hospitalist program.  Brief history.  Report were given to Dr. Erlinda Hong prior to patient being transported to the emergency department via wheelchair.  Per the cancer Center nurse.

## 2015-04-26 NOTE — Assessment & Plan Note (Signed)
She presented to the [facility] today with complaint of fairly severe mucositis and subsequent dehydration.  She denies any recent fevers or chills.  Patient has been receiving IV fluid rehydration almost every day this week; but continues to feel dehydrated due to minimal oral intake.  Patient will be admitted to the hospital for further evaluation and management.

## 2015-04-26 NOTE — Assessment & Plan Note (Signed)
Patient reports an approximately 24-hour history of dysuria and urinary incontinence.  She states that she is wearing a pad since she is constantly leaking urine at this time.  She denies any hematuria, fever, or chills.  She states that she is unable to give a urine sample; because she is continually leaking urine.  Advised hospitalist Dr. Erlinda Hong; as well as floor nurse, Jenny Reichmann of the need to collect a urine and a urine culture for further evaluation of possible UTI.  Of note-patient denies any numbness, tingling, or weakness to her back or lower extremities.

## 2015-04-26 NOTE — Assessment & Plan Note (Signed)
Patient received cycle 4 FOLFOX chemotherapy on 04/16/2015.  She received Neulasta for growth factor support on 04/18/2015.  She presented to the [facility] today with complaint of fairly severe mucositis and subsequent dehydration.  She denies any recent fevers or chills. She has been receiving IV fluid rehydration almost every day this week; but continues with the same complaints.  She will be direct admitted to the hospital today for further evaluation and treatment.  Per Dr. Allyn Kenner plan is for the patient to receive one additional cycle of chemotherapy prior to restaging scans.  She will return on 05/07/2015 for labs, visit, and chemotherapy.

## 2015-04-26 NOTE — Assessment & Plan Note (Signed)
Patient received FOLFOX chemotherapy on 04/16/2015.  She received Neulasta for growth factor support on 04/18/2015.  She presented to the [facility] today with complaint of fairly severe mucositis and subsequent dehydration.  She also complains of a significant sore throat.  She states that she has been unable to take in any solid foods since this past weekend; and is now unable to swallow her pain medication or even water.  She denies any recent fevers or chills.  On exam.-Patient has oral lesions to her oral mucosa, and multiple lesions to her upper and lower lips as well as well.  Patient prescribed Hycocet liquid pain medication; as well as dexamethasone elixir for a swish and swallow to see if this helps.  Patient is scheduled to return tomorrow for additional IV fluid rehydration.

## 2015-04-26 NOTE — H&P (Signed)
[facility] History and Physical  Brianna Vasquez [facility]:[id number] DOB: 18-Oct-1960 DOA: 04/26/2015  Referring physician: direct admission from cancer center  PCP: Clayborn Heron, MD   Chief Complaint: difficulty swallowing   HPI:  54 y.o. female diagnosed with ovarian cancer, currently undergoing FOLFOX chemotherapy regimen, received FOLFOX chemotherapy on 04/16/2015. She received Neulasta for growth factor support on 04/18/2015, presented to [facility] to cancer center today with concern of severe mucositis and poor oral intake for several days, associated with sore throat. She denies chest pain, shortness of breath, no abdominal or urinary concerns. TRH asked to admit for hydration.  Assessment and Plan: Failure to thrive - Secondary to mucositis - Provide IV fluids, magic mouthwash - Advance diet as patient able to tolerate   Mucositis - Management as noted above   Thrombocytopenia - Likely chemotherapy related  - Monitor, no indication for transfusion  - CBC in the morning   Ovarian cancer - Oncologist aware of patient's admission   Lovenox for DVT prophylaxis  Radiological Exams on Admission: No results found.   Code Status: Full Family Communication: Pt and husband at bedside Disposition Plan: Admit for further evaluation    Danie Binder [facility] [phone]   Review of Systems:  Constitutional:  Negative for diaphoresis.  HENT: Negative for hearing loss, ear pain, tinnitus and ear discharge.   Eyes: Negative for blurred vision, double vision, photophobia, pain, discharge and redness.  Respiratory: Negative for shortness of breath, wheezing and stridor.   Cardiovascular: Negative for chest pain, palpitations, orthopnea, claudication and leg swelling.  Gastrointestinal: Negative for heartburn, constipation, blood in stool and melena.  Genitourinary: Negative for dysuria, urgency, frequency, hematuria and flank pain.  Musculoskeletal: Negative for myalgias, back pain,  joint pain and falls.  Skin: Negative for itching and rash.  Neurological: Negative for dizziness and weakness.  Endo/Heme/Allergies: Negative for environmental allergies and polydipsia. Does not bruise/bleed easily.  Psychiatric/Behavioral: Negative for suicidal ideas. The patient is not nervous/anxious.      Past Medical History  Diagnosis Date  . Wears glasses   . Diverticulosis     dx 09/2014  . Esophagus hernia     hx of  . GERD (gastroesophageal reflux disease)     once in a while  . Anemia     hx of, none since hysterectomy  . Headache     severe, for last few mornings  . Cancer (HCC)     "ovary tumor-on colon, kidney, spots on liver and lungs"  . Anxiety     recent, situational    Past Surgical History  Procedure Laterality Date  . Wisdom tooth extraction  ~2001  . Cholecystectomy  2007    lap choli  . Breast lumpectomy Right 10/2013  . Vaginal hysterectomy  2005  . Colonoscopy  2016  . Laparotomy N/A 01/16/2015    Procedure: EXPLORATORY LAPAROTOMY ;  Surgeon: Cleda Mccreedy, MD;  Location: [facility];  Service: Gynecology;  Laterality: N/A;  . Oophorectomy Bilateral 01/16/2015    Procedure: BILATERAL OOPHORECTOMY RESECTION OF PELVIC MASS ;  Surgeon: Cleda Mccreedy, MD;  Location: [facility];  Service: Gynecology;  Laterality: Bilateral;  . Cystoscopy w/ ureteral stent placement Left 01/16/2015    Procedure: CYSTOSCOPY WITH RETROGRADE PYELOGRAM/URETERAL STENT PLACEMENT;  Surgeon: Jethro Bolus, MD;  Location: [facility];  Service: Urology;  Laterality: Left;  . Porta cath insertion    . Colonoscopy with propofol N/A 02/09/2015    Procedure: COLONOSCOPY WITH PROPOFOL;  Surgeon: Bernette Redbird, MD;  [facility]: [facility];  Service: Endoscopy;  Laterality: N/A;    Social History:  reports that she quit smoking about 29 years ago. Her smoking use included Cigarettes. She quit after 4 years of use. She has never used smokeless tobacco. She reports that she drinks about 4.2 oz of alcohol  per week. She reports that she does not use illicit drugs.  Allergies  Allergen Reactions  . Other Swelling and Other (See Comments)    Adhesive Glue-Caused rash and burning.   . Ativan [Lorazepam] Other (See Comments)    Dizziness, loss of balance  . Darvon [Propoxyphene] Other (See Comments)    Hallucinations     Family History  Problem Relation Age of Onset  . Pancreatic cancer Father 25  . Breast cancer Paternal Aunt     dx. 81-41  . Breast cancer Paternal Grandmother 13  . Cancer Paternal Grandmother     "adenocarcinoma of gall bladder possibly"  . Colon cancer Maternal Grandfather 39  . Alzheimer's disease Paternal Grandfather   . Skin cancer Mother     dx. 47-61; squamous cell carcinoma  . Colon polyps Mother     6-7 total  . Other Mother     TAH due to issue with IUD; abnormal breast findings  . Other Sister     TAH due to pre-cancerous cells of cervix; still has ovarie  . Breast cancer Cousin     dx. 45 or younger; radical mastectomy; BRCA+  . Throat cancer Cousin     dx. 30; smokeless tobacco user  . Pancreatic cancer Other   . Lung cancer Cousin   . Pancreatic cancer Other     Prior to Admission medications   Medication Sig Start Date End Date Taking? Authorizing Provider  ALPRAZolam Duanne Moron) 0.5 MG tablet Take 1 tablet (0.5 mg total) by mouth every 8 (eight) hours as needed. for anxiety 04/16/15  Yes Ladell Pier, MD  dexamethasone 0.5 MG/5ML elixir Take 5 mls PO QID PRN mucositis. 04/25/15  Yes Susanne Borders, NP  fluconazole (DIFLUCAN) 100 MG tablet Take 1 tablet (100 mg total) by mouth daily. 04/04/15  Yes Susanne Borders, NP  HYDROcodone-acetaminophen (HYCET) 7.5-325 mg/15 ml solution Take 15 mLs by mouth every 6 (six) hours as needed for moderate pain. 04/25/15  Yes Susanne Borders, NP  lidocaine-prilocaine (EMLA) cream Apply to port site one hour prior to use. Do not rub in. Cover with plastic. Patient taking differently: Apply 1 application topically  daily as needed (for port access). Apply to port site one hour prior to use. Do not rub in. Cover with plastic. 02/07/15  Yes Ladell Pier, MD  magic mouthwash w/lidocaine SOLN Take 5 mLs by mouth 4 (four) times daily as needed for mouth pain (Duke's formula w/ nystatin and lidocaine 1:1.). 04/04/15  Yes Susanne Borders, NP  calcium carbonate (TUMS - DOSED IN MG ELEMENTAL CALCIUM) 500 MG chewable tablet Chew 1-2 tablets by mouth 3 (three) times daily as needed for indigestion or heartburn.    Historical Provider, MD  gabapentin (NEURONTIN) 300 MG capsule Take 2 capsules (600 mg total) by mouth 3 (three) times daily. Patient not taking: Reported on 04/26/2015 02/16/15   Dorothyann Gibbs, NP  metoCLOPramide (REGLAN) 10 MG tablet Take 1 tablet (10 mg total) by mouth 3 (three) times daily before meals. Patient not taking: Reported on 04/26/2015 03/20/15   Ladell Pier, MD  ondansetron (ZOFRAN ODT) 4 MG disintegrating tablet Take 1 tablet (  4 mg total) by mouth every 8 (eight) hours as needed for nausea or vomiting. 03/16/15   Gareth Morgan, MD  ondansetron (ZOFRAN) 8 MG tablet Take 1 tablet (8 mg total) by mouth every 8 (eight) hours. Patient not taking: Reported on 04/26/2015 04/11/15   Ladell Pier, MD  oxyCODONE (OXY IR/ROXICODONE) 5 MG immediate release tablet Take 1-2 tabs every 4 hours as needed for pain Patient not taking: Reported on 04/26/2015 04/16/15   Ladell Pier, MD  potassium chloride SA (K-DUR,KLOR-CON) 20 MEQ tablet Take 1 tablet (20 mEq total) by mouth 2 (two) times daily. 04/11/15   Ladell Pier, MD  PRESCRIPTION MEDICATION Chemo at [facility] through pump for 2 days at home and gets pump connected after Chemo IV at [facility] and then comes back to get it disconnected after the 2 days    Historical Provider, MD  sucralfate (CARAFATE) 1 G tablet Take 1 g by mouth 4 (four) times daily as needed (for stomach ulcers).  01/30/15   Historical Provider, MD    Physical Exam: There were  no vitals filed for this visit.  Physical Exam  Constitutional: Appears well-developed and well-nourished. No distress.  HENT: Normocephalic. External right and left ear normal. Erythema noted in OP Eyes: Conjunctivae and EOM are normal. PERRLA, no scleral icterus.  Neck: Normal ROM. Neck supple. No JVD. No tracheal deviation. No thyromegaly.  CVS: RRR, S1/S2 +, no murmurs, no gallops, no carotid bruit.  Pulmonary: Effort and breath sounds normal, no stridor, rhonchi, wheezes, rales.  Abdominal: Soft. BS +,  no distension, tenderness, rebound or guarding.  Musculoskeletal: Normal range of motion. No edema and no tenderness.  Lymphadenopathy: No lymphadenopathy noted, cervical, inguinal. Neuro: Alert. Normal reflexes, muscle tone coordination. No cranial nerve deficit. Skin: Skin is warm and dry. No rash noted. Not diaphoretic. No erythema. No pallor.  Psychiatric: Normal mood and affect. Behavior, judgment, thought content normal.   Labs on Admission:  Basic Metabolic Panel:  Recent Labs Lab 04/23/15 1335  NA 142  K 3.8  CO2 21*  GLUCOSE 93  BUN 12.9  CREATININE 0.7  CALCIUM 9.5   Liver Function Tests:  Recent Labs Lab 04/23/15 1335  AST 39*  ALT 16  ALKPHOS 256*  BILITOT 1.10  PROT 7.4  ALBUMIN 3.1*   CBC:  Recent Labs Lab 04/23/15 1335  WBC 4.2  NEUTROABS 2.8  HGB 11.3*  HCT 36.1  MCV 93.2  PLT 85*    EKG: Pending   If 7PM-7AM, please contact night-coverage [email or web address] Password [email or web address] 04/26/2015, 2:14 PM

## 2015-04-26 NOTE — Progress Notes (Signed)
Direct admission from the cancer center :  Brianna Vasquez, Brianna Vasquez is a 81yr female with metastatic ovarian ca undergoing chemotherapy with Folfox, last on 11/7, she is have severe mucositis, not able to take any oral intake or pills , direct admit for severe mucositis/dehydration/deconditioning/pain control , also new complaints of urinary incontinence, need to check ua as well (h/o left ureteral stent placed this 01/2015 likely due to external compression for cancer) Admit to med surg, ivf/ iv pain meds/topical treatment for mucositis/PT for deconditioning.  Please call flow manager Upon patient's arrival to floor.

## 2015-04-26 NOTE — Assessment & Plan Note (Signed)
She presented to the [facility] today with complaint of fairly severe mucositis and subsequent dehydration.  She denies any recent fevers or chills.  Patient received IV fluid rehydration while at the cancer Center today.  Patient has plans to return tomorrow for additional IV fluid rehydration.

## 2015-04-26 NOTE — Progress Notes (Signed)
SYMPTOM MANAGEMENT CLINIC   HPI: Brianna Vasquez 54 y.o. female diagnosed with ovarian cancer.  Currently undergoing FOLFOX chemotherapy regimen.  Patient received FOLFOX chemotherapy on 04/16/2015.  She received Neulasta for growth factor support on 04/18/2015.  She presented to the [facility] today with complaint of fairly severe mucositis and subsequent dehydration.  She also complains of a significant sore throat.  She states that she has been unable to take in any solid foods since this past weekend; and is now unable to swallow her pain medication or even water.  She denies any recent fevers or chills.  HPI  ROS  Past Medical History  Diagnosis Date  . Wears glasses   . Diverticulosis     dx 09/2014  . Esophagus hernia     hx of  . GERD (gastroesophageal reflux disease)     once in a while  . Anemia     hx of, none since hysterectomy  . Headache     severe, for last few mornings  . Cancer ([place])     "ovary tumor-on colon, kidney, spots on liver and lungs"  . Anxiety     recent, situational    Past Surgical History  Procedure Laterality Date  . Wisdom tooth extraction  ~2001  . Cholecystectomy  2007    lap choli  . Breast lumpectomy Right 10/2013  . Vaginal hysterectomy  2005  . Colonoscopy  2016  . Laparotomy N/A 01/16/2015    Procedure: EXPLORATORY LAPAROTOMY ;  Surgeon: Nancy Marus, MD;  Location: [facility];  Service: Gynecology;  Laterality: N/A;  . Oophorectomy Bilateral 01/16/2015    Procedure: BILATERAL OOPHORECTOMY RESECTION OF PELVIC MASS ;  Surgeon: Nancy Marus, MD;  Location: [facility];  Service: Gynecology;  Laterality: Bilateral;  . Cystoscopy w/ ureteral stent placement Left 01/16/2015    Procedure: CYSTOSCOPY WITH RETROGRADE PYELOGRAM/URETERAL STENT PLACEMENT;  Surgeon: Carolan Clines, MD;  Location: [facility];  Service: Urology;  Laterality: Left;  . Porta cath insertion    . Colonoscopy with propofol N/A 02/09/2015    Procedure: COLONOSCOPY WITH  PROPOFOL;  Surgeon: Ronald Lobo, MD;  Location: [facility];  Service: Endoscopy;  Laterality: N/A;    has Atypical ductal hyperplasia of right breast; Pelvic mass in female; Ovarian mass; Ovarian cancer on left [facility]); Family history of breast cancer in female; Family history of pancreatic cancer; Family history of colon cancer; Dehydration; Nausea without vomiting; UTI (urinary tract infection); Flank pain; Chemotherapy induced neutropenia ([place]); Genetic testing; Antineoplastic chemotherapy induced pancytopenia ([place]); Candidal esophagitis ([place]); Neoplastic malignant related fatigue; and Mucositis on her problem list.    is allergic to other; ativan; and darvon.    Medication List       This list is accurate as of: 04/25/15 11:59 PM.  Always use your most recent med list.               ALPRAZolam 0.5 MG tablet  Commonly known as:  XANAX  Take 1 tablet (0.5 mg total) by mouth every 8 (eight) hours as needed. for anxiety     calcium carbonate 500 MG chewable tablet  Commonly known as:  TUMS - dosed in mg elemental calcium  Chew 1-2 tablets by mouth 3 (three) times daily as needed for indigestion or heartburn.     dexamethasone 0.5 MG/5ML elixir  Take 5 mls PO QID PRN mucositis.     fluconazole 100 MG tablet  Commonly known as:  DIFLUCAN  Take 1 tablet (100  mg total) by mouth daily.     gabapentin 300 MG capsule  Commonly known as:  NEURONTIN  Take 2 capsules (600 mg total) by mouth 3 (three) times daily.     HYDROcodone-acetaminophen 7.5-325 mg/15 ml solution  Commonly known as:  HYCET  Take 15 mLs by mouth every 6 (six) hours as needed for moderate pain.     lidocaine-prilocaine cream  Commonly known as:  EMLA  Apply to port site one hour prior to use. Do not rub in. Cover with plastic.     LORazepam 0.5 MG tablet  Commonly known as:  ATIVAN  Take 1 tablet (0.5 mg total) by mouth every 6 (six) hours as needed for anxiety (nausea).     magic mouthwash w/lidocaine  Soln  Take 5 mLs by mouth 4 (four) times daily as needed for mouth pain ([employer]'s formula w/ nystatin and lidocaine 1:1.).     metoCLOPramide 10 MG tablet  Commonly known as:  REGLAN  Take 1 tablet (10 mg total) by mouth 3 (three) times daily before meals.     ondansetron 4 MG disintegrating tablet  Commonly known as:  ZOFRAN ODT  Take 1 tablet (4 mg total) by mouth every 8 (eight) hours as needed for nausea or vomiting.     ondansetron 8 MG tablet  Commonly known as:  ZOFRAN  Take 1 tablet (8 mg total) by mouth every 8 (eight) hours.     oxyCODONE 5 MG immediate release tablet  Commonly known as:  Oxy IR/ROXICODONE  Take 1-2 tabs every 4 hours as needed for pain     potassium chloride SA 20 MEQ tablet  Commonly known as:  K-DUR,KLOR-CON  Take 1 tablet (20 mEq total) by mouth 2 (two) times daily.     PRESCRIPTION MEDICATION  Chemo at [facility] through pump for 2 days at home and gets pump connected after Chemo IV at [facility] and then comes back to get it disconnected after the 2 days     sucralfate 1 G tablet  Commonly known as:  CARAFATE  Take 1 g by mouth 4 (four) times daily as needed (for stomach ulcers).         PHYSICAL EXAMINATION  Oncology Vitals 04/26/2015 04/25/2015  Height 165 cm -  Weight 64.728 kg -  Weight (lbs) 142 lbs 11 oz -  BMI (kg/m2) 23.75 kg/m2 -  Temp 98 -  Pulse 102 96  Resp 18 -  SpO2 100 -  BSA (m2) 1.72 m2 -   BP Readings from Last 2 Encounters:  04/26/15 117/65  04/25/15 124/65    Physical Exam  Constitutional: She is well-developed, well-nourished, and in no distress.  HENT:  Head: Normocephalic and atraumatic.  On exam.-Patient has oral lesions to her oral mucosa, and multiple lesions to her upper and lower lips as well as well.    Nursing note and vitals reviewed.   LABORATORY DATA:. Appointment on 04/23/2015  Component Date Value Ref Range Status  . WBC 04/23/2015 4.2  3.9 - 10.3 10e3/uL Final  . NEUT# 04/23/2015 2.8  1.5 -  6.5 10e3/uL Final  . HGB 04/23/2015 11.3* 11.6 - 15.9 g/dL Final  . HCT 04/23/2015 36.1  34.8 - 46.6 % Final  . Platelets 04/23/2015 85* 145 - 400 10e3/uL Final  . MCV 04/23/2015 93.2  79.5 - 101.0 fL Final  . MCH 04/23/2015 29.1  25.1 - 34.0 pg Final  . MCHC 04/23/2015 31.3* 31.5 - 36.0 g/dL Final  .  RBC 04/23/2015 3.88  3.70 - 5.45 10e6/uL Final  . RDW 04/23/2015 20.5* 11.2 - 14.5 % Final  . lymph# 04/23/2015 1.2  0.9 - 3.3 10e3/uL Final  . MONO# 04/23/2015 0.1  0.1 - 0.9 10e3/uL Final  . Eosinophils Absolute 04/23/2015 0.1  0.0 - 0.5 10e3/uL Final  . Basophils Absolute 04/23/2015 0.0  0.0 - 0.1 10e3/uL Final  . NEUT% 04/23/2015 67.7  38.4 - 76.8 % Final  . LYMPH% 04/23/2015 28.8  14.0 - 49.7 % Final  . MONO% 04/23/2015 1.9  0.0 - 14.0 % Final  . EOS% 04/23/2015 1.5  0.0 - 7.0 % Final  . BASO% 04/23/2015 0.1  0.0 - 2.0 % Final  . Sodium 04/23/2015 142  136 - 145 mEq/L Final  . Potassium 04/23/2015 3.8  3.5 - 5.1 mEq/L Final  . Chloride 04/23/2015 110* 98 - 109 mEq/L Final  . CO2 04/23/2015 21* 22 - 29 mEq/L Final  . Glucose 04/23/2015 93  70 - 140 mg/dl Final   Glucose reference range is for nonfasting patients. Fasting glucose reference range is 70- 100.  Marland Kitchen BUN 04/23/2015 12.9  7.0 - 26.0 mg/dL Final  . Creatinine 04/23/2015 0.7  0.6 - 1.1 mg/dL Final  . Total Bilirubin 04/23/2015 1.10  0.20 - 1.20 mg/dL Final  . Alkaline Phosphatase 04/23/2015 256* 40 - 150 U/L Final  . AST 04/23/2015 39* 5 - 34 U/L Final  . ALT 04/23/2015 16  0 - 55 U/L Final  . Total Protein 04/23/2015 7.4  6.4 - 8.3 g/dL Final  . Albumin 04/23/2015 3.1* 3.5 - 5.0 g/dL Final  . Calcium 04/23/2015 9.5  8.4 - 10.4 mg/dL Final  . Anion Gap 04/23/2015 11  3 - 11 mEq/L Final  . EGFR 04/23/2015 >90  >90 ml/min/1.73 m2 Final   eGFR is calculated using the CKD-EPI Creatinine Equation (2009)     RADIOGRAPHIC STUDIES: No results found.  ASSESSMENT/PLAN:    Ovarian cancer on left Patient received cycle 4 FOLFOX  chemotherapy on 04/16/2015.  She received Neulasta for growth factor support on 04/18/2015.  She presented to the [facility] today with complaint of fairly severe mucositis and subsequent dehydration.  She denies any recent fevers or chills.  Patient will receive IV fluid rehydration while the cancer Center today.  Per Dr. Allyn Kenner plan is for the patient to receive one additional cycle of chemotherapy prior to restaging scans.  She will return on 05/07/2015 for labs, visit, and chemotherapy.    Dehydration She presented to the cancer Center today with complaint of fairly severe mucositis and subsequent dehydration.  She denies any recent fevers or chills.  Patient received IV fluid rehydration while at the cancer Center today.  Patient has plans to return tomorrow for additional IV fluid rehydration.    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  She received Neulasta for growth factor support on 04/18/2015.  She presented to the [facility] today with complaint of fairly severe mucositis and subsequent dehydration.  She also complains of a significant sore throat.  She states that she has been unable to take in any solid foods since this past weekend; and is now unable to swallow her pain medication or even water.  She denies any recent fevers or chills.  On exam.-Patient has oral lesions to her oral mucosa, and multiple lesions to her upper and lower lips as well as well.  Patient prescribed Hycocet liquid pain medication; as well as dexamethasone elixir for  a swish and swallow to see if this helps.  Patient is scheduled to return tomorrow for additional IV fluid rehydration.       Patient stated understanding of all instructions; and was in agreement with this plan of care. The patient knows to call the clinic with any problems, questions or concerns.   Review/collaboration with Dr. Benay Spice regarding all aspects of patient's visit  today.   Total time spent with patient was 25 minutes;  with greater than 75 percent of that time spent in face to face counseling regarding patient's symptoms,  and coordination of care and follow up.  Disclaimer:This dictation was prepared with Dragon/digital dictation along with [employer]. Any transcriptional errors that result from this process are unintentional.  Drue Second, NP 04/26/2015

## 2015-04-27 ENCOUNTER — Other Ambulatory Visit: Payer: Self-pay | Admitting: *Deleted

## 2015-04-27 ENCOUNTER — Telehealth: Payer: Self-pay | Admitting: Oncology

## 2015-04-27 DIAGNOSIS — K1231 Oral mucositis (ulcerative) due to antineoplastic therapy: Secondary | ICD-10-CM

## 2015-04-27 DIAGNOSIS — C7800 Secondary malignant neoplasm of unspecified lung: Secondary | ICD-10-CM

## 2015-04-27 DIAGNOSIS — D701 Agranulocytosis secondary to cancer chemotherapy: Secondary | ICD-10-CM

## 2015-04-27 DIAGNOSIS — C5702 Malignant neoplasm of left fallopian tube: Secondary | ICD-10-CM

## 2015-04-27 DIAGNOSIS — C562 Malignant neoplasm of left ovary: Secondary | ICD-10-CM | POA: Diagnosis not present

## 2015-04-27 DIAGNOSIS — T451X5A Adverse effect of antineoplastic and immunosuppressive drugs, initial encounter: Principal | ICD-10-CM

## 2015-04-27 DIAGNOSIS — C7951 Secondary malignant neoplasm of bone: Secondary | ICD-10-CM

## 2015-04-27 DIAGNOSIS — E860 Dehydration: Secondary | ICD-10-CM | POA: Diagnosis not present

## 2015-04-27 DIAGNOSIS — C772 Secondary and unspecified malignant neoplasm of intra-abdominal lymph nodes: Secondary | ICD-10-CM

## 2015-04-27 DIAGNOSIS — K1230 Oral mucositis (ulcerative), unspecified: Secondary | ICD-10-CM | POA: Diagnosis not present

## 2015-04-27 DIAGNOSIS — D61810 Antineoplastic chemotherapy induced pancytopenia: Secondary | ICD-10-CM | POA: Diagnosis not present

## 2015-04-27 LAB — DIFFERENTIAL
Basophils Absolute: 0 10*3/uL (ref 0.0–0.1)
Basophils Relative: 2 %
EOS PCT: 2 %
Eosinophils Absolute: 0 10*3/uL (ref 0.0–0.7)
LYMPHS ABS: 1.9 10*3/uL (ref 0.7–4.0)
Lymphocytes Relative: 88 %
MONOS PCT: 7 %
Monocytes Absolute: 0.1 10*3/uL (ref 0.1–1.0)
NEUTROS ABS: 0 10*3/uL — AB (ref 1.7–7.7)
Neutrophils Relative %: 1 %

## 2015-04-27 LAB — BASIC METABOLIC PANEL
Anion gap: 7 (ref 5–15)
BUN: 12 mg/dL (ref 6–20)
CALCIUM: 8.8 mg/dL — AB (ref 8.9–10.3)
CO2: 23 mmol/L (ref 22–32)
Chloride: 109 mmol/L (ref 101–111)
Creatinine, Ser: 0.53 mg/dL (ref 0.44–1.00)
GFR calc Af Amer: >60 mL/min (ref >60)
GLUCOSE: 77 mg/dL (ref 65–99)
Potassium: 3.1 mmol/L — ABNORMAL LOW (ref 3.5–5.1)
Sodium: 139 mmol/L (ref 135–145)

## 2015-04-27 LAB — CBC
HEMATOCRIT: 27.1 % — AB (ref 36.0–46.0)
Hemoglobin: 8.8 g/dL — ABNORMAL LOW (ref 12.0–15.0)
MCH: 30.2 pg (ref 26.0–34.0)
MCHC: 32.5 g/dL (ref 30.0–36.0)
MCV: 93.1 fL (ref 78.0–100.0)
Platelets: 34 10*3/uL — ABNORMAL LOW (ref 150–400)
RBC: 2.91 MIL/uL — ABNORMAL LOW (ref 3.87–5.11)
RDW: 18.1 % — ABNORMAL HIGH (ref 11.5–15.5)
WBC: 2 10*3/uL — ABNORMAL LOW (ref 4.0–10.5)

## 2015-04-27 MED ORDER — POTASSIUM CHLORIDE CRYS ER 20 MEQ PO TBCR
40.0000 meq | EXTENDED_RELEASE_TABLET | Freq: Once | ORAL | Status: AC
  Administered 2015-04-27: 40 meq via ORAL
  Filled 2015-04-27 (×2): qty 2

## 2015-04-27 MED ORDER — LOPERAMIDE HCL 1 MG/5ML PO LIQD
2.0000 mg | ORAL | Status: DC | PRN
  Administered 2015-04-27 (×2): 2 mg via ORAL
  Filled 2015-04-27 (×3): qty 10

## 2015-04-27 MED ORDER — OXYCODONE HCL 5 MG/5ML PO SOLN: 5.0000 mg | ORAL | Status: DC | PRN

## 2015-04-27 MED ORDER — ENSURE ENLIVE PO LIQD
237.0000 mL | Freq: Two times a day (BID) | ORAL | Status: DC
  Administered 2015-04-28 – 2015-04-29 (×2): 237 mL via ORAL

## 2015-04-27 MED ORDER — SUCRALFATE 1 GM/10ML PO SUSP
1.0000 g | Freq: Three times a day (TID) | ORAL | Status: DC
  Administered 2015-04-27 – 2015-04-29 (×10): 1 g via ORAL
  Filled 2015-04-27 (×9): qty 10

## 2015-04-27 MED ORDER — MAGIC MOUTHWASH
10.0000 mL | Freq: Three times a day (TID) | ORAL | Status: DC
  Administered 2015-04-27 – 2015-04-29 (×10): 10 mL via ORAL
  Filled 2015-04-27 (×12): qty 10

## 2015-04-27 MED ORDER — DEXTROSE 5 % IV SOLN
1.0000 g | INTRAVENOUS | Status: DC
  Administered 2015-04-27 – 2015-04-29 (×3): 1 g via INTRAVENOUS
  Filled 2015-04-27 (×3): qty 10

## 2015-04-27 NOTE — Progress Notes (Addendum)
IP PROGRESS NOTE  Subjective:   She reports feeling stronger today. She continues to have pain and a "sticking "sensation with swallowing. No bleeding.  Objective: Vital signs in last 24 hours: Blood pressure 115/63, pulse 93, temperature 98.5 F (36.9 C), temperature source Oral, resp. rate 18, height $RemoveBe'5\' 5"'nEuQOKkma$  (1.651 m), weight 141 lb (63.957 kg), SpO2 100 %.  Intake/Output from previous day: 11/17 0701 - 11/18 0700 In: -  Out: 4 [Urine:4]  Physical Exam:  HEENT: Ulcerations at the lower lip, bilateral buccal mucosa, and tongue. No thrush Lungs: Clear bilaterally Cardiac: Regular rate and rhythm Abdomen: Soft and nontender Extremities: No leg edema   Portacath/PICC-without erythema  Lab Results:  Recent Labs  04/26/15 2018 04/27/15 0417  WBC 1.4* 2.0*  HGB 9.3* 8.8*  HCT 29.1* 27.1*  PLT 35* 34*    BMET  Recent Labs  04/26/15 2018 04/27/15 0417  NA  --  139  K  --  3.1*  CL  --  109  CO2  --  23  GLUCOSE  --  77  BUN  --  12  CREATININE 0.68 0.53  CALCIUM  --  8.8*    Studies/Results: No results found.  Medications: I have reviewed the patient's current medications.  Assessment/Plan:  1. 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 Metastatic left periaortic adenopathy.  PET scan 02/06/2015 with extensive hypermetabolic lymphadenopathy in the chest, abdomen, and pelvis, Residual left pelvic tumor, sigmoid colon lesion, liver/lung metastases, and a left acetabulum metastasis  Colonoscopy 02/09/2015 with a stricture at 30 cm, biopsy positive for adenocarcinoma, no intrinsic colon mass noted  Cycle 1 FOLFOX  02/14/2015  Cycle 2 FOLFOX 02/28/2015  CT 03/16/2015 with improvement in lung metastases, stable liver lesions and left pelvic soft tissue fullness, right ovarian vein thrombosis  Cycle 3 FOLFOX 03/26/2015 with Neulasta support  Cycle 4 FOLFOX 04/16/2015  2. Placement of a left ureter stent 01/16/2015  3. Family history of multiple cancers, report of a paternal cousin-BRCA2 positive  4. Upper endoscopy 01/29/2015 with a localized thick gastric fold with a small ulcer  5. Mild swelling of the left foot and ankle 01/27/2015  6. Pain secondary to the left pelvic tumor and acetabulum metastasis  7. Acute/delayed nausea following FOLFOX-Aloxi and amend were added with cycle 2, prophylactic Decadron with cycle 3  8. Acute transient loss of bladder control with cycle 1 FOLFOX  9. Severe neutropenia following cycle 2 FOLFOX. Neulasta added beginning with cycle 3.  10. Mucositis secondary to chemotherapy  11. Pancytopenia secondary chemotherapy  She was admitted yesterday with severe mucositis following FOLFOX chemotherapy. The mouth ulcers appear partially improved today. She reports adequate pain control and is willing to try an oral narcotic. I will add Carafate slurry and Roxicet elixir.  She has pancytopenia at day 12 following chemotherapy. The white count and platelets should improve over the next few days.  The 5-fluorouracil and oxaliplatin will be dose reduced with the next cycle of chemotherapy.  Recommendations: 1. Try Carafate slurry and oxycodone elixir for pain 2. Advance diet as tolerated 3. She can be discharged to home 04/28/2015 if she is taking by mouth, the platelet count is not lower, and the neutrophil count improves  4. Please call Oncology as needed on 04/28/2015. I will see her 04/29/2015 if she remains in the hospital. We will schedule outpatient follow-up.  I appreciate the care from Dr. Doyle Askew.    LOS: 1 day   [place]  04/27/2015, 8:00 AM  Addendum: The neutrophil count returned at "0 "this morning. She should be placed on broad-spectrum intravenous antibiotics with blood cultures of the blood and urine if she has a fever. I ordered a repeat CBC for 04/28/2015.

## 2015-04-27 NOTE — Telephone Encounter (Signed)
s.w. pt and advised on 11.21 appt.Marland KitchenMarland KitchenMarland KitchenMarland Kitchenpt ok adn aware

## 2015-04-27 NOTE — Progress Notes (Signed)
Initial Nutrition Assessment  DOCUMENTATION CODES:   Not applicable, Non-severe (moderate) malnutrition in context of chronic illness  INTERVENTION:  Ensure Enlive po BID, each supplement provides 350 kcal and 20 grams of protein  NUTRITION DIAGNOSIS:   Inadequate oral intake related to inability to eat, poor appetite as evidenced by per patient/family report, percent weight loss.  GOAL:   Patient will meet greater than or equal to 90% of their needs  MONITOR:   PO intake, Labs, I & O's, Supplement acceptance  REASON FOR ASSESSMENT:   Consult Assessment of nutrition requirement/status  ASSESSMENT:   54 y.o. female diagnosed with ovarian cancer, currently undergoing FOLFOX chemotherapy regimen, received FOLFOX chemotherapy on 04/16/2015. She received Neulasta for growth factor support on 04/18/2015, presented to [facility] to cancer center today with concern of severe mucositis and poor oral intake for several days, associated with sore throat.  RD consulted to help with oral intake.  Pt admitted to poor PO due to blisters in throat, down esophageal lining related to chemo treatment. (FOLFOX 04/16/2015)  Pt states she could not tolerate liquids sometimes.  Pt also exhibits a 29#/17% severe wt loss in 2 months. Encouraged pt to continue drinking ensure/boost when possible, along with whole milk. Pt said she enjoys milk and will try this.  Encouraged pt to speak with Oncology RD at [facility], pt said she will schedule an appointment with her.  Nutrition-Focused physical exam completed. Findings are mild fat depletion, mild muscle depletion, and no edema.   Follow for PO intake; Supplement acceptance  Diet Order:  DIET SOFT Room service appropriate?: Yes; Fluid consistency:: Thin  Skin:  Reviewed, no issues  Last BM:  04/24/2015  Height:   Ht Readings from Last 1 Encounters:  04/26/15 5\' 5"  (1.651 m)    Weight:   Wt Readings from Last 1 Encounters:  04/26/15 141 lb (63.957  kg)    Ideal Body Weight:  56.81 kg  BMI:  Body mass index is 23.46 kg/(m^2).  Estimated Nutritional Needs:   Kcal:  1900-2300 calories  Protein:  75-95 grams  Fluid:  >/= 1.9L  EDUCATION NEEDS:   No education needs identified at this time  Satira Anis. Oaklee Sunga, MS, RD LDN After Hours/Weekend Pager [phone]

## 2015-04-27 NOTE — Progress Notes (Signed)
Patient ID: Brianna Vasquez, female   DOB: [birth year], 54 y.o.   MRN: [id number]  [facility] PROGRESS NOTE  Brianna Vasquez [id number] DOB: 28-Oct-1960 DOA: 04/26/2015 PCP: Aretta Nip, MD   Brief narrative:    54 y.o. female diagnosed with ovarian cancer, currently undergoing FOLFOX chemotherapy regimen, received FOLFOX chemotherapy on 04/16/2015. She received Neulasta for growth factor support on 04/18/2015, presented to [facility] to cancer center today with concern of severe mucositis and poor oral intake for several days, associated with sore throat. She denies chest pain, shortness of breath, no abdominal or urinary concerns. TRH asked to admit for hydration.   Assessment/Plan:    Failure to thrive - Secondary to mucositis - pt reports feeling better this AM - continue to provide IV fluids, magic mouthwash - Advance diet as patient able to tolerate  Mucositis - Management as noted above   Pancytopenia  - Likely chemotherapy related  - Monitor, no indication for transfusion  - CBC in the morning   Hypokalemia - supplement and repeat BMP in AM  Ovarian cancer - appreciate Dr. Gearldine Shown assistance    DVT prophylaxis - Lovenox SQ  Code Status: Full.  Family Communication:  plan of care discussed with the patient Disposition Plan: Home by 11/19 if tolerating diet well   IV access:  Peripheral IV  Procedures and diagnostic studies:    No results found.  Medical Consultants:  Oncologist   Other Consultants:  Nutritionist   IAnti-Infectives:   None  Faye Ramsay, MD  [place] Pager [phone]  If 7PM-7AM, please contact night-coverage [email or web address] Password [email or web address] 04/27/2015, 9:02 AM   LOS: 1 day   HPI/Subjective: No events overnight.   Objective: Filed Vitals:   04/26/15 1449 04/26/15 2319 04/27/15 0415  BP: 112/68 123/62 115/63  Pulse: 91 97 93  Temp: 97.5 F (36.4 C) 98.6 F (37 C) 98.5 F (36.9 C)  TempSrc: Oral Oral Oral   Resp: 18 18 18   Height: 5\' 5"  (1.651 m)    Weight: 63.957 kg (141 lb)    SpO2: 100% 100% 100%    Intake/Output Summary (Last 24 hours) at 04/27/15 0902 Last data filed at 04/27/15 0050  Gross per 24 hour  Intake      0 ml  Output      4 ml  Net     -4 ml    Exam:   General:  Pt is alert, follows commands appropriately, not in acute distress  Cardiovascular: Regular rate and rhythm, S1/S2, no murmurs, no rubs, no gallops  Respiratory: Clear to auscultation bilaterally, no wheezing, no crackles, no rhonchi  Abdomen: Soft, non tender, non distended, bowel sounds present, no guarding   Data Reviewed: Basic Metabolic Panel:  Recent Labs Lab 04/23/15 1335 04/26/15 2018 04/27/15 0417  NA 142  --  139  K 3.8  --  3.1*  CL  --   --  109  CO2 21*  --  23  GLUCOSE 93  --  77  BUN 12.9  --  12  CREATININE 0.7 0.68 0.53  CALCIUM 9.5  --  8.8*   Liver Function Tests:  Recent Labs Lab 04/23/15 1335  AST 39*  ALT 16  ALKPHOS 256*  BILITOT 1.10  PROT 7.4  ALBUMIN 3.1*   CBC:  Recent Labs Lab 04/23/15 1335 04/26/15 2018 04/27/15 0417  WBC 4.2 1.4* 2.0*  NEUTROABS 2.8  --  PENDING  HGB 11.3* 9.3* 8.8*  HCT 36.1 29.1* 27.1*  MCV  93.2 93.3 93.1  PLT 85* 35* 34*     Scheduled Meds: . enoxaparin (LOVENOX) injection  40 mg Subcutaneous Q24H  . fluconazole (DIFLUCAN) IV  100 mg Intravenous Q24H  . magic mouthwash  10 mL Oral QID  . pantoprazole (PROTONIX) IV  40 mg Intravenous Q24H  . potassium chloride  40 mEq Oral Once  . sucralfate  1 g Oral TID WC & HS   Continuous Infusions: . sodium chloride 75 mL/hr at 04/26/15 1504

## 2015-04-28 DIAGNOSIS — R1310 Dysphagia, unspecified: Secondary | ICD-10-CM | POA: Diagnosis present

## 2015-04-28 DIAGNOSIS — K1230 Oral mucositis (ulcerative), unspecified: Secondary | ICD-10-CM | POA: Diagnosis not present

## 2015-04-28 DIAGNOSIS — T451X5A Adverse effect of antineoplastic and immunosuppressive drugs, initial encounter: Secondary | ICD-10-CM | POA: Diagnosis present

## 2015-04-28 DIAGNOSIS — E860 Dehydration: Secondary | ICD-10-CM | POA: Diagnosis not present

## 2015-04-28 DIAGNOSIS — Z800 Family history of malignant neoplasm of digestive organs: Secondary | ICD-10-CM | POA: Diagnosis not present

## 2015-04-28 DIAGNOSIS — C801 Malignant (primary) neoplasm, unspecified: Secondary | ICD-10-CM | POA: Diagnosis not present

## 2015-04-28 DIAGNOSIS — C7951 Secondary malignant neoplasm of bone: Secondary | ICD-10-CM | POA: Diagnosis present

## 2015-04-28 DIAGNOSIS — Z803 Family history of malignant neoplasm of breast: Secondary | ICD-10-CM | POA: Diagnosis not present

## 2015-04-28 DIAGNOSIS — D701 Agranulocytosis secondary to cancer chemotherapy: Secondary | ICD-10-CM | POA: Diagnosis not present

## 2015-04-28 DIAGNOSIS — D61810 Antineoplastic chemotherapy induced pancytopenia: Secondary | ICD-10-CM | POA: Diagnosis present

## 2015-04-28 DIAGNOSIS — E876 Hypokalemia: Secondary | ICD-10-CM | POA: Diagnosis present

## 2015-04-28 DIAGNOSIS — Z801 Family history of malignant neoplasm of trachea, bronchus and lung: Secondary | ICD-10-CM | POA: Diagnosis not present

## 2015-04-28 DIAGNOSIS — R627 Adult failure to thrive: Secondary | ICD-10-CM | POA: Diagnosis present

## 2015-04-28 DIAGNOSIS — Z90710 Acquired absence of both cervix and uterus: Secondary | ICD-10-CM | POA: Diagnosis not present

## 2015-04-28 DIAGNOSIS — C787 Secondary malignant neoplasm of liver and intrahepatic bile duct: Secondary | ICD-10-CM | POA: Diagnosis present

## 2015-04-28 DIAGNOSIS — Z9049 Acquired absence of other specified parts of digestive tract: Secondary | ICD-10-CM | POA: Diagnosis not present

## 2015-04-28 DIAGNOSIS — C7800 Secondary malignant neoplasm of unspecified lung: Secondary | ICD-10-CM | POA: Diagnosis present

## 2015-04-28 DIAGNOSIS — C562 Malignant neoplasm of left ovary: Secondary | ICD-10-CM | POA: Diagnosis present

## 2015-04-28 DIAGNOSIS — K1231 Oral mucositis (ulcerative) due to antineoplastic therapy: Secondary | ICD-10-CM | POA: Diagnosis present

## 2015-04-28 DIAGNOSIS — Z87891 Personal history of nicotine dependence: Secondary | ICD-10-CM | POA: Diagnosis not present

## 2015-04-28 LAB — CBC WITH DIFFERENTIAL/PLATELET
BASOS ABS: 0 10*3/uL (ref 0.0–0.1)
BASOS PCT: 2 %
EOS ABS: 0.1 10*3/uL (ref 0.0–0.7)
Eosinophils Relative: 3 %
HEMATOCRIT: 24.6 % — AB (ref 36.0–46.0)
Hemoglobin: 7.9 g/dL — ABNORMAL LOW (ref 12.0–15.0)
LYMPHS ABS: 2 10*3/uL (ref 0.7–4.0)
Lymphocytes Relative: 84 %
MCH: 30.2 pg (ref 26.0–34.0)
MCHC: 32.1 g/dL (ref 30.0–36.0)
MCV: 93.9 fL (ref 78.0–100.0)
MONOS PCT: 9 %
Monocytes Absolute: 0.2 10*3/uL (ref 0.1–1.0)
NEUTROS ABS: 0 10*3/uL — AB (ref 1.7–7.7)
Neutrophils Relative %: 2 %
Platelets: 28 10*3/uL — CL (ref 150–400)
RBC: 2.62 MIL/uL — ABNORMAL LOW (ref 3.87–5.11)
RDW: 18.4 % — AB (ref 11.5–15.5)
WBC: 2.3 10*3/uL — ABNORMAL LOW (ref 4.0–10.5)

## 2015-04-28 LAB — BASIC METABOLIC PANEL
ANION GAP: 6 (ref 5–15)
BUN: 6 mg/dL (ref 6–20)
CO2: 24 mmol/L (ref 22–32)
Calcium: 8.7 mg/dL — ABNORMAL LOW (ref 8.9–10.3)
Chloride: 111 mmol/L (ref 101–111)
Creatinine, Ser: 0.69 mg/dL (ref 0.44–1.00)
GFR calc Af Amer: >60 mL/min (ref >60)
GLUCOSE: 104 mg/dL — AB (ref 65–99)
POTASSIUM: 3.1 mmol/L — AB (ref 3.5–5.1)
Sodium: 141 mmol/L (ref 135–145)

## 2015-04-28 LAB — PREPARE RBC (CROSSMATCH)

## 2015-04-28 MED ORDER — SODIUM CHLORIDE 0.9 % IV SOLN: Freq: Once | INTRAVENOUS | Status: DC

## 2015-04-28 NOTE — Progress Notes (Signed)
Patient ID: Brianna Vasquez, female   DOB: 08/17/1960, 54 y.o.   MRN: [id number]  [facility] PROGRESS NOTE  Brianna Vasquez [id number] DOB: [birth year] DOA: 04/26/2015 PCP: Aretta Nip, MD   Brief narrative:    54 y.o. female diagnosed with ovarian cancer, currently undergoing FOLFOX chemotherapy regimen, received FOLFOX chemotherapy on 04/16/2015. She received Neulasta for growth factor support on 04/18/2015, presented to [facility] to cancer center today with concern of severe mucositis and poor oral intake for several days, associated with sore throat. She denies chest pain, shortness of breath, no abdominal or urinary concerns. TRH asked to admit for hydration.   Assessment/Plan:    Failure to thrive - Secondary to mucositis - pt reports feeling better this AM - continue to provide IV fluids, magic mouthwash - on soft diet so far and tolerating well   Mucositis - Management as noted above   Pancytopenia  - Likely chemotherapy related  - Hg and Plt down in the past 24 hours - will transfuse one U PRBC and one U Plt - CBC in the morning   Hypokalemia - BMP pending this AM   Ovarian cancer - appreciate Dr. Gearldine Shown assistance   DVT prophylaxis - Lovenox SQ  Code Status: Full.  Family Communication:  plan of care discussed with the patient Disposition Plan: Home when Plt and Hg stable  IV access:  Peripheral IV  Procedures and diagnostic studies:    No results found.  Medical Consultants:  Oncologist   Other Consultants:  Nutritionist   IAnti-Infectives:   None  Faye Ramsay, MD  [place] Pager [phone]  If 7PM-7AM, please contact night-coverage [email or web address] Password [email or web address] 04/28/2015, 9:31 AM   LOS: 2 days   HPI/Subjective: No events overnight.   Objective: Filed Vitals:   04/27/15 0415 04/27/15 1318 04/27/15 2030 04/28/15 0500  BP: 115/63 113/49 106/60 91/59  Pulse: 93 101 102 95  Temp: 98.5 F (36.9 C) 98.3 F (36.8 C) 99.9  F (37.7 C) 98.4 F (36.9 C)  TempSrc: Oral Oral Oral Oral  Resp: 18 16 18 16   Height:      Weight:      SpO2: 100% 98% 99% 98%    Intake/Output Summary (Last 24 hours) at 04/28/15 0931 Last data filed at 04/28/15 0500  Gross per 24 hour  Intake   2710 ml  Output      7 ml  Net   2703 ml    Exam:   General:  Pt is alert, follows commands appropriately, not in acute distress  Cardiovascular: Regular rate and rhythm, S1/S2, no murmurs, no rubs, no gallops  Respiratory: Clear to auscultation bilaterally, no wheezing, no crackles, no rhonchi  Abdomen: Soft, non tender, non distended, bowel sounds present, no guarding   Data Reviewed: Basic Metabolic Panel:  Recent Labs Lab 04/23/15 1335 04/26/15 2018 04/27/15 0417  NA 142  --  139  K 3.8  --  3.1*  CL  --   --  109  CO2 21*  --  23  GLUCOSE 93  --  77  BUN 12.9  --  12  CREATININE 0.7 0.68 0.53  CALCIUM 9.5  --  8.8*   Liver Function Tests:  Recent Labs Lab 04/23/15 1335  AST 39*  ALT 16  ALKPHOS 256*  BILITOT 1.10  PROT 7.4  ALBUMIN 3.1*   CBC:  Recent Labs Lab 04/23/15 1335 04/26/15 2018 04/27/15 0417 04/28/15 0500  WBC 4.2 1.4* 2.0* 2.3*  NEUTROABS 2.8  --  0.0* 0.0*  HGB 11.3* 9.3* 8.8* 7.9*  HCT 36.1 29.1* 27.1* 24.6*  MCV 93.2 93.3 93.1 93.9  PLT 85* 35* 34* 28*     Scheduled Meds: . sodium chloride   Intravenous Once  . cefTRIAXone (ROCEPHIN)  IV  1 g Intravenous Q24H  . enoxaparin (LOVENOX) injection  40 mg Subcutaneous Q24H  . feeding supplement (ENSURE ENLIVE)  237 mL Oral BID BM  . fluconazole (DIFLUCAN) IV  100 mg Intravenous Q24H  . magic mouthwash  10 mL Oral TID AC & HS  . pantoprazole (PROTONIX) IV  40 mg Intravenous Q24H  . sucralfate  1 g Oral TID WC & HS   Continuous Infusions: . sodium chloride 75 mL/hr at 04/28/15 [phone]

## 2015-04-29 ENCOUNTER — Other Ambulatory Visit: Payer: Self-pay | Admitting: Oncology

## 2015-04-29 DIAGNOSIS — C801 Malignant (primary) neoplasm, unspecified: Secondary | ICD-10-CM

## 2015-04-29 LAB — TYPE AND SCREEN
ABO/RH(D): O NEG
Antibody Screen: POSITIVE
DAT, IGG: NEGATIVE
UNIT DIVISION: 0

## 2015-04-29 LAB — BASIC METABOLIC PANEL
ANION GAP: 8 (ref 5–15)
ANION GAP: 8 (ref 5–15)
BUN: <5 mg/dL — ABNORMAL LOW (ref 6–20)
BUN: <5 mg/dL — ABNORMAL LOW (ref 6–20)
CALCIUM: 8.7 mg/dL — AB (ref 8.9–10.3)
CO2: 24 mmol/L (ref 22–32)
CO2: 24 mmol/L (ref 22–32)
CREATININE: 0.46 mg/dL (ref 0.44–1.00)
CREATININE: 0.56 mg/dL (ref 0.44–1.00)
Calcium: 8.7 mg/dL — ABNORMAL LOW (ref 8.9–10.3)
Chloride: 108 mmol/L (ref 101–111)
Chloride: 105 mmol/L (ref 101–111)
GFR calc Af Amer: >60 mL/min (ref >60)
GLUCOSE: 83 mg/dL (ref 65–99)
Glucose, Bld: 124 mg/dL — ABNORMAL HIGH (ref 65–99)
POTASSIUM: 3.4 mmol/L — AB (ref 3.5–5.1)
Potassium: 2.8 mmol/L — ABNORMAL LOW (ref 3.5–5.1)
SODIUM: 137 mmol/L (ref 135–145)
Sodium: 140 mmol/L (ref 135–145)

## 2015-04-29 LAB — DIFFERENTIAL
BASOS ABS: 0 10*3/uL (ref 0.0–0.1)
BASOS PCT: 1 %
EOS ABS: 0.1 10*3/uL (ref 0.0–0.7)
Eosinophils Relative: 3 %
LYMPHS ABS: 2.5 10*3/uL (ref 0.7–4.0)
Lymphocytes Relative: 67 %
MONO ABS: 0.4 10*3/uL (ref 0.1–1.0)
Monocytes Relative: 12 %
Neutro Abs: 0.6 10*3/uL — ABNORMAL LOW (ref 1.7–7.7)
Neutrophils Relative %: 17 %

## 2015-04-29 LAB — CBC
HCT: 28.3 % — ABNORMAL LOW (ref 36.0–46.0)
Hemoglobin: 9.3 g/dL — ABNORMAL LOW (ref 12.0–15.0)
MCH: 30.3 pg (ref 26.0–34.0)
MCHC: 32.9 g/dL (ref 30.0–36.0)
MCV: 92.2 fL (ref 78.0–100.0)
PLATELETS: 42 10*3/uL — AB (ref 150–400)
RBC: 3.07 MIL/uL — ABNORMAL LOW (ref 3.87–5.11)
RDW: 17.6 % — AB (ref 11.5–15.5)
WBC: 3.6 10*3/uL — AB (ref 4.0–10.5)

## 2015-04-29 LAB — PREPARE PLATELET PHERESIS: Unit division: 0

## 2015-04-29 MED ORDER — ZOLPIDEM TARTRATE 5 MG PO TABS: 5.0000 mg | ORAL_TABLET | Freq: Every evening | ORAL | Status: DC | PRN

## 2015-04-29 MED ORDER — HEPARIN SOD (PORK) LOCK FLUSH 100 UNIT/ML IV SOLN
500.0000 [IU] | Freq: Once | INTRAVENOUS | Status: AC
  Administered 2015-04-29: 500 [IU]
  Filled 2015-04-29: qty 5

## 2015-04-29 MED ORDER — LOPERAMIDE HCL 1 MG/5ML PO LIQD: 2.0000 mg | ORAL | Status: AC | PRN

## 2015-04-29 MED ORDER — OXYCODONE HCL 5 MG PO TABS: ORAL_TABLET | ORAL | Status: DC

## 2015-04-29 MED ORDER — POTASSIUM CHLORIDE 10 MEQ/100ML IV SOLN
10.0000 meq | INTRAVENOUS | Status: AC
  Administered 2015-04-29 (×6): 10 meq via INTRAVENOUS
  Filled 2015-04-29 (×6): qty 100

## 2015-04-29 MED ORDER — FLUCONAZOLE 100 MG PO TABS: 100.0000 mg | ORAL_TABLET | Freq: Every day | ORAL | Status: DC

## 2015-04-29 NOTE — Discharge Summary (Signed)
Physician Discharge Summary  ESOSA RASBERRY [id number] DOB: January 08, 1961 DOA: 04/26/2015  PCP: Aretta Nip, MD  Admit date: 04/26/2015 Discharge date: 04/29/2015  Recommendations for Outpatient Follow-up:  1. Pt will need to follow up with PCP in 2-3 weeks post discharge 2. Please obtain BMP to evaluate electrolytes and kidney function, K level  3. Please also check CBC to evaluate Hg and Hct levels, Plt, WBC  Discharge Diagnoses:  Active Problems:   Antineoplastic chemotherapy induced pancytopenia (HCC)   Ovarian cancer on left (HCC)   Dehydration   Mucositis  Discharge Condition: Stable  Diet recommendation: Soft diet and advanced as tolerated     Brief narrative:    54 y.o. female diagnosed with ovarian cancer, currently undergoing FOLFOX chemotherapy regimen, received FOLFOX chemotherapy on 04/16/2015. She received Neulasta for growth factor support on 04/18/2015, presented to [facility] to cancer center today with concern of severe mucositis and poor oral intake for several days, associated with sore throat. She denies chest pain, shortness of breath, no abdominal or urinary concerns. TRH asked to admit for hydration.   Assessment/Plan:    Failure to thrive - Secondary to mucositis - pt reports feeling better this AM - on soft diet so far and tolerating well   Mucositis - Management as noted above   Pancytopenia  - Likely chemotherapy related  - transfused one U PRBC and one U Plt - CBC notable with improving counts   Hypokalemia - supplement prior to discharge and repeat BMP prior to d/c   Ovarian cancer - appreciate Dr. Gearldine Shown assistance    Code Status: Full.  Family Communication: plan of care discussed with the patient Disposition Plan: Home   IV access:  Peripheral IV  Procedures and diagnostic studies:    Imaging Results    No results found.    Medical Consultants:  Oncologist   Other Consultants:   Nutritionist   IAnti-Infectives:   None       Discharge Exam: Filed Vitals:   04/28/15 2206  BP: 148/81  Pulse: 96  Temp: 98.9 F (37.2 C)  Resp: 18   Filed Vitals:   04/28/15 1759 04/28/15 1815 04/28/15 2035 04/28/15 2206  BP: 116/61 121/65 117/64 148/81  Pulse: 92 88 84 96  Temp: 98.5 F (36.9 C) 98.4 F (36.9 C) 98.8 F (37.1 C) 98.9 F (37.2 C)  TempSrc: Oral Oral Oral Oral  Resp: 18 18 16 18   Height:      Weight:      SpO2: 98% 99% 99% 99%    General: Pt is alert, follows commands appropriately, not in acute distress Cardiovascular: Regular rate and rhythm, S1/S2 +, no murmurs, no rubs, no gallops Respiratory: Clear to auscultation bilaterally, no wheezing, no crackles, no rhonchi Abdominal: Soft, non tender, non distended, bowel sounds +, no guarding  Discharge Instructions  Discharge Instructions    Diet - low sodium heart healthy    Complete by:  As directed      Increase activity slowly    Complete by:  As directed             Medication List    STOP taking these medications        gabapentin 300 MG capsule  Commonly known as:  NEURONTIN     metoCLOPramide 10 MG tablet  Commonly known as:  REGLAN      TAKE these medications        ALPRAZolam 0.5 MG tablet  Commonly known as:  Duanne Moron  Take 1 tablet (0.5 mg total) by mouth every 8 (eight) hours as needed. for anxiety     B COMPLEX PO  Take 1 tablet by mouth daily.     calcium carbonate 500 MG chewable tablet  Commonly known as:  TUMS - dosed in mg elemental calcium  Chew 1-2 tablets by mouth 3 (three) times daily as needed for indigestion or heartburn.     dexamethasone 0.5 MG/5ML elixir  Take 5 mls PO QID PRN mucositis.     ECHINACEA PO  Take 1 tablet by mouth daily.     fluconazole 100 MG tablet  Commonly known as:  DIFLUCAN  Take 1 tablet (100 mg total) by mouth daily.     HYDROcodone-acetaminophen 7.5-325 mg/15 ml solution  Commonly known as:  HYCET  Take 15 mLs by  mouth every 6 (six) hours as needed for moderate pain.     lidocaine-prilocaine cream  Commonly known as:  EMLA  Apply to port site one hour prior to use. Do not rub in. Cover with plastic.     loperamide 1 MG/5ML solution  Commonly known as:  IMODIUM  Take 10 mLs (2 mg total) by mouth as needed for diarrhea or loose stools.     magic mouthwash w/lidocaine Soln  Take 5 mLs by mouth 4 (four) times daily as needed for mouth pain ([employer] formula w/ nystatin and lidocaine 1:1.).     ondansetron 4 MG disintegrating tablet  Commonly known as:  ZOFRAN ODT  Take 1 tablet (4 mg total) by mouth every 8 (eight) hours as needed for nausea or vomiting.     ondansetron 8 MG tablet  Commonly known as:  ZOFRAN  Take 1 tablet (8 mg total) by mouth every 8 (eight) hours.     oxyCODONE 5 MG immediate release tablet  Commonly known as:  Oxy IR/ROXICODONE  Take 1-2 tabs every 4 hours as needed for pain     POTASSIUM PO  Take 1 tablet by mouth daily.     PRESCRIPTION MEDICATION  Chemo at [place] through pump for 2 days at home and gets pump connected after Chemo IV at [facility] and then comes back to get it disconnected after the 2 days     prochlorperazine 10 MG tablet  Commonly known as:  COMPAZINE  Take 10 mg by mouth daily as needed for nausea.     sucralfate 1 G tablet  Commonly known as:  CARAFATE  Take 1 g by mouth 4 (four) times daily as needed (for stomach ulcers).     vitamin C 1000 MG tablet  Take 1,000 mg by mouth daily.     zolpidem 5 MG tablet  Commonly known as:  AMBIEN  Take 1 tablet (5 mg total) by mouth at bedtime as needed for sleep.          The results of significant diagnostics from this hospitalization (including imaging, microbiology, ancillary and laboratory) are listed below for reference.     Microbiology: Recent Results (from the past 240 hour(s))  Culture, Urine     Status: None (Preliminary result)   Collection Time: 04/26/15  4:32 PM  Result Value  Ref Range Status   Specimen Description URINE, CLEAN CATCH  Final   Special Requests NONE  Final   Culture   Final    TOO YOUNG TO READ Performed at [facility]    Report Status PENDING  Incomplete     Labs: Basic Metabolic Panel:  Recent  Labs Lab 04/23/15 1335 04/26/15 2018 04/27/15 0417 04/28/15 1118 04/29/15 0600  NA 142  --  139 141 140  K 3.8  --  3.1* 3.1* 2.8*  CL  --   --  109 111 108  CO2 21*  --  23 24 24   GLUCOSE 93  --  77 104* 83  BUN 12.9  --  12 6 <5*  CREATININE 0.7 0.68 0.53 0.69 0.46  CALCIUM 9.5  --  8.8* 8.7* 8.7*   Liver Function Tests:  Recent Labs Lab 04/23/15 1335  AST 39*  ALT 16  ALKPHOS 256*  BILITOT 1.10  PROT 7.4  ALBUMIN 3.1*   No results for input(s): LIPASE, AMYLASE in the last 168 hours. No results for input(s): AMMONIA in the last 168 hours. CBC:  Recent Labs Lab 04/23/15 1335 04/26/15 2018 04/27/15 0417 04/28/15 0500 04/29/15 0600  WBC 4.2 1.4* 2.0* 2.3* 3.6*  NEUTROABS 2.8  --  0.0* 0.0*  --   HGB 11.3* 9.3* 8.8* 7.9* 9.3*  HCT 36.1 29.1* 27.1* 24.6* 28.3*  MCV 93.2 93.3 93.1 93.9 92.2  PLT 85* 35* 34* 28* 42*   Cardiac Enzymes: No results for input(s): CKTOTAL, CKMB, CKMBINDEX, TROPONINI in the last 168 hours. BNP: BNP (last 3 results) No results for input(s): BNP in the last 8760 hours.  ProBNP (last 3 results) No results for input(s): PROBNP in the last 8760 hours.  CBG: No results for input(s): GLUCAP in the last 168 hours.   SIGNED: Time coordinating discharge: 30 minutes  Faye Ramsay, MD  [facility] 04/29/2015, 8:40 AM Pager [phone]  If 7PM-7AM, please contact night-coverage [email or web address] Password [email or web address]

## 2015-04-29 NOTE — Discharge Instructions (Signed)
Oral Mucositis Oral mucositis is a mouth condition that may develop from treatments for cancer. With this condition, sores may appear on your lips, gums, tongue, throat, and the top (roof) or bottom (floor) of your mouth. CAUSES Oral mucositis can happen to anyone who is being treated with cancer therapies, including:  Cancer medicines (chemotherapy).  Radiation therapy.  Bone marrow transplants and stem cell transplants. Cancer treatments can damage the lining of the mouth, and that causes this condition. Oral mucositis is not caused by infection. However, the sores can become infected after they form. Infection can make oral mucositis worse. RISK FACTORS This condition is more likely to develop in people:  With poor oral hygiene.  With dental problems or oral diseases.  Who use any tobacco product, including cigarettes, chewing tobacco, and electronic cigarettes.  Who drink alcohol.  Who have other medical conditions, such as diabetes, HIV, AIDS, or kidney disease.  Who do not drink enough clear fluids.  Who wear dentures that do not fit correctly.  Who have cancers that primarily affect the blood.  Who are female. SYMPTOMS Symptoms can vary from mild to severe. Symptoms are usually seen 7-10 days after cancer treatment has started. Symptoms include:  Mouth sores. These sores may bleed.  Color changes inside the mouth. Red, shiny areas may appear.  White patches or pus in the mouth.  Pain in the mouth and throat. This can make it painful to speak.  Dryness and a burning feeling in the mouth.  Saliva that is dry and thick.  Trouble eating, drinking, and swallowing. This can lead to weight loss. DIAGNOSIS This condition can be diagnosed with a physical exam. TREATMENT Treatment depends on the severity of the condition. Oral mucositis often heals on its own. Sometimes, changes in the cancer treatment can help. Treatment may include medicines, such as:  An antibiotic  medicine to fight infection, if present.  Medicine to help the cells in your mouth heal more quickly. Medicine may also be given to help control pain. This may include:  Pain relievers that are swished around in the mouth. These make the mouth numb to ease the pain (topical anesthetics).   Mouth rinses.  Prescribed, medicated gels. The gel coats the mouth. This protects nerve endings and lessens pain.  Narcotic pain medicines. HOME CARE INSTRUCTIONS Medicines  If you were prescribed an antibiotic medicine, finish all of it even if you start to feel better.  Take or apply medicines only as directed by your health care provider. Lifestyle  Keeping your mouth clean and germ-free is important. To maintain good oral hygiene:  Brush your teeth carefully with a soft, nylon-bristled toothbrush at least two times each day. Use a gentle toothpaste. Ask your health care provider for a toothpaste recommendation.  Floss your teeth every day.  Have your teeth cleaned regularly as recommended by your dentist.  Rinse your mouth after every meal or as directed by your health care provider. Do not use mouthwash that contains alcohol. Ask your health care provider for a mouthwash recommendation.  Do not use any tobacco products, including cigarettes, chewing tobacco, and electronic cigarettes. If you need help quitting, ask your health care provider.  Avoid eating:  Hot and spicy foods.  Citrus.  Foods that have sharp edges, such as chips.  Sugary foods, such as candy.  Do not drink alcohol. General Instructions  Clean the sores as directed by your health care provider.  If your lips are dry or cracked, apply a   water-based moisturizer to your lips as needed.  Try sucking on ice chips or sugar-free frozen pops. This may help with pain. This also keeps your mouth moist.  Drink enough fluid to keep your urine clear or pale yellow.  If you are losing weight, talk with your health care  provider.  If you have dentures, take them out often as directed by your health care provider.  Keep all follow-up visits as directed by your health care provider. This is important. SEEK MEDICAL CARE IF:  You have mouth pain or throat pain.  You are having more trouble swallowing.  Your symptoms get worse.  You have new symptoms.  Your pain is not controlled with medicine. SEEK IMMEDIATE MEDICAL CARE IF:  You have a lot of bleeding in your mouth.  You have trouble speaking.  You develop new, open, or draining sores in your mouth.  You cannot swallow solid food or liquids.  You have a fever.   This information is not intended to replace advice given to you by your health care provider. Make sure you discuss any questions you have with your health care provider.   Document Released: 01/10/2011 Document Revised: 10/10/2014 Document Reviewed: 05/22/2014 Elsevier Interactive Patient Education 2016 [employer]  

## 2015-04-29 NOTE — Progress Notes (Signed)
IP PROGRESS NOTE  Subjective:   She reports feeling better. She continues to have dysphagia, but she is tolerating a partial diet. The mouth pain has improved. No bleeding.  Objective: Vital signs in last 24 hours: Blood pressure 148/81, pulse 96, temperature 98.9 F (37.2 C), temperature source Oral, resp. rate 18, height _0  (1.651 m), weight 141 lb (63.957 kg), SpO2 99 %.  Intake/Output from previous day: 11/19 0701 - 11/20 0700 In: 2225.6 [P.O.:1080; I.V.:567.6; Blood:578] Out: 7 [Urine:7]  Physical Exam:  HEENT: Significant healing of ulcers at the buccal mucosa, tongue, and lower lip. No thrush Lungs: Clear bilaterally Cardiac: Regular rate and rhythm Abdomen: Soft, mild right upper quadrant tenderness, no hepatosplenomegaly Extremities: No leg edema   Portacath/PICC-without erythema  Lab Results:  Recent Labs  04/28/15 0500 04/29/15 0600  WBC 2.3* 3.6*  HGB 7.9* 9.3*  HCT 24.6* 28.3*  PLT 28* 42*    BMET  Recent Labs  04/28/15 1118 04/29/15 0600  NA 141 140  K 3.1* 2.8*  CL 111 108  CO2 24 24  GLUCOSE 104* 83  BUN 6 <5*  CREATININE 0.69 0.46  CALCIUM 8.7* 8.7*    Studies/Results: No results found.  Medications: I have reviewed the patient's current medications.  Assessment/Plan:  1. 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 Metastatic left periaortic adenopathy.  PET scan 02/06/2015 with extensive hypermetabolic lymphadenopathy in the chest, abdomen, and pelvis, Residual left pelvic tumor, sigmoid colon lesion, liver/lung metastases, and a left acetabulum metastasis  Colonoscopy 02/09/2015 with  a stricture at 30 cm, biopsy positive for adenocarcinoma, no intrinsic colon mass noted  Cycle 1 FOLFOX 02/14/2015  Cycle 2 FOLFOX 02/28/2015  CT 03/16/2015 with improvement in lung metastases, stable liver lesions and left pelvic soft tissue fullness, right ovarian vein thrombosis  Cycle 3 FOLFOX 03/26/2015 with Neulasta support  Cycle 4 FOLFOX 04/16/2015  2. Placement of a left ureter stent 01/16/2015  3. Family history of multiple cancers, report of a paternal cousin-BRCA2 positive  4. Upper endoscopy 01/29/2015 with a localized thick gastric fold with a small ulcer  5. Mild swelling of the left foot and ankle 01/27/2015  6. Pain secondary to the left pelvic tumor and acetabulum metastasis  7. Acute/delayed nausea following FOLFOX-Aloxi and amend were added with cycle 2, prophylactic Decadron with cycle 3  8. Acute transient loss of bladder control with cycle 1 FOLFOX  9. Severe neutropenia following cycle 2 FOLFOX. Neulasta added beginning with cycle 3.  10. Mucositis secondary to chemotherapy-improved  11. Pancytopenia secondary chemotherapy-the hemoglobin and platelets are up after transfusion yesterday, neutrophil count pending today    She appears improved. The mucositis is healing. She appears stable for discharge to home if the neutrophil count is higher today.  Recommendations: 1. Continue Carafate slurry for esophagitis 2. Advance diet as tolerated 3. Oxycodone elixir as needed for pain 4. Stop Lovenox with the thrombocytopenia 5. [facility] CBC on 04/30/2015.  I appreciate the care from Dr. Doyle Askew.    LOS: 3 days   [place]  04/29/2015, 8:47 AM  Addendum: The neutrophil count returned at "0 "this morning. She should be placed on broad-spectrum intravenous antibiotics with  blood cultures of the blood and urine if she has a fever. I ordered a repeat CBC for 04/28/2015.

## 2015-04-29 NOTE — Progress Notes (Signed)
Patient discharged to home with family, discharge instructions reviewed with patient who verbalized understanding. New RX's given to patient. 

## 2015-04-30 ENCOUNTER — Telehealth: Payer: Self-pay | Admitting: *Deleted

## 2015-04-30 ENCOUNTER — Other Ambulatory Visit (HOSPITAL_BASED_OUTPATIENT_CLINIC_OR_DEPARTMENT_OTHER): Payer: BLUE CROSS/BLUE SHIELD

## 2015-04-30 DIAGNOSIS — D701 Agranulocytosis secondary to cancer chemotherapy: Secondary | ICD-10-CM | POA: Diagnosis not present

## 2015-04-30 DIAGNOSIS — T451X5A Adverse effect of antineoplastic and immunosuppressive drugs, initial encounter: Principal | ICD-10-CM

## 2015-04-30 LAB — CBC WITH DIFFERENTIAL/PLATELET
BASO%: 1.3 % (ref 0.0–2.0)
BASOS ABS: 0.1 10*3/uL (ref 0.0–0.1)
EOS ABS: 0.2 10*3/uL (ref 0.0–0.5)
EOS%: 3.3 % (ref 0.0–7.0)
HCT: 31.9 % — ABNORMAL LOW (ref 34.8–46.6)
HGB: 10.4 g/dL — ABNORMAL LOW (ref 11.6–15.9)
LYMPH%: 32.5 % (ref 14.0–49.7)
MCH: 29.8 pg (ref 25.1–34.0)
MCHC: 32.6 g/dL (ref 31.5–36.0)
MCV: 91.2 fL (ref 79.5–101.0)
MONO#: 0.9 10*3/uL (ref 0.1–0.9)
MONO%: 19.2 % — AB (ref 0.0–14.0)
NEUT#: 2.1 10*3/uL (ref 1.5–6.5)
NEUT%: 43.7 % (ref 38.4–76.8)
Platelets: 82 10*3/uL — ABNORMAL LOW (ref 145–400)
RBC: 3.5 10*6/uL — AB (ref 3.70–5.45)
RDW: 18.5 % — ABNORMAL HIGH (ref 11.2–14.5)
WBC: 4.7 10*3/uL (ref 3.9–10.3)
lymph#: 1.5 10*3/uL (ref 0.9–3.3)

## 2015-04-30 LAB — URINE CULTURE

## 2015-04-30 NOTE — Telephone Encounter (Signed)
Left message informing pt WBC and PLT are better, per Dr. Benay Spice.

## 2015-04-30 NOTE — Telephone Encounter (Signed)
-----   Message from Ladell Pier, MD sent at 04/30/2015  4:13 PM EST ----- Please call patient, wbcs and platelets look good, f/u as scheduled

## 2015-05-01 ENCOUNTER — Telehealth: Payer: Self-pay | Admitting: Oncology

## 2015-05-01 NOTE — Telephone Encounter (Signed)
Due to LT out per BS moved 11/28 f/u to him at 10:30 am. Other appointments adjusted accordingly - message to inf manager/chemo scheduler to adjust tx. Left message for patient and mailed schedule.

## 2015-05-06 ENCOUNTER — Other Ambulatory Visit: Payer: Self-pay | Admitting: Oncology

## 2015-05-07 ENCOUNTER — Telehealth: Payer: Self-pay | Admitting: Oncology

## 2015-05-07 ENCOUNTER — Ambulatory Visit (HOSPITAL_BASED_OUTPATIENT_CLINIC_OR_DEPARTMENT_OTHER): Payer: BLUE CROSS/BLUE SHIELD

## 2015-05-07 ENCOUNTER — Telehealth: Payer: Self-pay | Admitting: *Deleted

## 2015-05-07 ENCOUNTER — Other Ambulatory Visit (HOSPITAL_BASED_OUTPATIENT_CLINIC_OR_DEPARTMENT_OTHER): Payer: BLUE CROSS/BLUE SHIELD

## 2015-05-07 ENCOUNTER — Ambulatory Visit (HOSPITAL_BASED_OUTPATIENT_CLINIC_OR_DEPARTMENT_OTHER): Payer: BLUE CROSS/BLUE SHIELD | Admitting: Oncology

## 2015-05-07 ENCOUNTER — Other Ambulatory Visit: Payer: Self-pay | Admitting: *Deleted

## 2015-05-07 VITALS — BP 129/65 | HR 109 | Temp 98.7°F | Resp 18 | Ht 65.0 in | Wt 143.1 lb

## 2015-05-07 DIAGNOSIS — C5702 Malignant neoplasm of left fallopian tube: Secondary | ICD-10-CM

## 2015-05-07 DIAGNOSIS — C7800 Secondary malignant neoplasm of unspecified lung: Secondary | ICD-10-CM

## 2015-05-07 DIAGNOSIS — G4700 Insomnia, unspecified: Secondary | ICD-10-CM

## 2015-05-07 DIAGNOSIS — C787 Secondary malignant neoplasm of liver and intrahepatic bile duct: Secondary | ICD-10-CM

## 2015-05-07 DIAGNOSIS — C7951 Secondary malignant neoplasm of bone: Secondary | ICD-10-CM

## 2015-05-07 DIAGNOSIS — Z5111 Encounter for antineoplastic chemotherapy: Secondary | ICD-10-CM

## 2015-05-07 DIAGNOSIS — C562 Malignant neoplasm of left ovary: Secondary | ICD-10-CM

## 2015-05-07 DIAGNOSIS — D701 Agranulocytosis secondary to cancer chemotherapy: Secondary | ICD-10-CM

## 2015-05-07 LAB — CBC WITH DIFFERENTIAL/PLATELET
BASO%: 1.4 % (ref 0.0–2.0)
Basophils Absolute: 0.1 10*3/uL (ref 0.0–0.1)
EOS%: 1.1 % (ref 0.0–7.0)
Eosinophils Absolute: 0.1 10*3/uL (ref 0.0–0.5)
HEMATOCRIT: 36.5 % (ref 34.8–46.6)
HGB: 11.6 g/dL (ref 11.6–15.9)
LYMPH#: 1.8 10*3/uL (ref 0.9–3.3)
LYMPH%: 26.2 % (ref 14.0–49.7)
MCH: 30.4 pg (ref 25.1–34.0)
MCHC: 31.8 g/dL (ref 31.5–36.0)
MCV: 95.7 fL (ref 79.5–101.0)
MONO#: 1.2 10*3/uL — AB (ref 0.1–0.9)
MONO%: 17.2 % — ABNORMAL HIGH (ref 0.0–14.0)
NEUT%: 54.1 % (ref 38.4–76.8)
NEUTROS ABS: 3.8 10*3/uL (ref 1.5–6.5)
PLATELETS: 300 10*3/uL (ref 145–400)
RBC: 3.82 10*6/uL (ref 3.70–5.45)
RDW: 22.3 % — ABNORMAL HIGH (ref 11.2–14.5)
WBC: 7 10*3/uL (ref 3.9–10.3)

## 2015-05-07 LAB — COMPREHENSIVE METABOLIC PANEL (CC13)
ALT: 16 U/L (ref 0–55)
AST: 44 U/L — AB (ref 5–34)
Albumin: 3.1 g/dL — ABNORMAL LOW (ref 3.5–5.0)
Alkaline Phosphatase: 199 U/L — ABNORMAL HIGH (ref 40–150)
Anion Gap: 9 mEq/L (ref 3–11)
BILIRUBIN TOTAL: 0.89 mg/dL (ref 0.20–1.20)
BUN: 8.9 mg/dL (ref 7.0–26.0)
CHLORIDE: 107 meq/L (ref 98–109)
CO2: 24 meq/L (ref 22–29)
CREATININE: 0.8 mg/dL (ref 0.6–1.1)
Calcium: 9.6 mg/dL (ref 8.4–10.4)
EGFR: 81 mL/min/{1.73_m2} — ABNORMAL LOW (ref >90)
Glucose: 125 mg/dl (ref 70–140)
Potassium: 3.9 mEq/L (ref 3.5–5.1)
Sodium: 140 mEq/L (ref 136–145)
TOTAL PROTEIN: 7.4 g/dL (ref 6.4–8.3)

## 2015-05-07 MED ORDER — SODIUM CHLORIDE 0.9 % IV SOLN
Freq: Once | INTRAVENOUS | Status: AC
  Administered 2015-05-07: 13:00:00 via INTRAVENOUS
  Filled 2015-05-07: qty 5

## 2015-05-07 MED ORDER — PALONOSETRON HCL INJECTION 0.25 MG/5ML
0.2500 mg | Freq: Once | INTRAVENOUS | Status: AC
  Administered 2015-05-07: 0.25 mg via INTRAVENOUS

## 2015-05-07 MED ORDER — DEXTROSE 5 % IV SOLN
Freq: Once | INTRAVENOUS | Status: AC
  Administered 2015-05-07: 13:00:00 via INTRAVENOUS

## 2015-05-07 MED ORDER — PALONOSETRON HCL INJECTION 0.25 MG/5ML
INTRAVENOUS | Status: AC
  Filled 2015-05-07: qty 5

## 2015-05-07 MED ORDER — SUCRALFATE 1 GM/10ML PO SUSP: 1.0000 g | Freq: Four times a day (QID) | ORAL | Status: AC | PRN

## 2015-05-07 MED ORDER — OXALIPLATIN CHEMO INJECTION 100 MG/20ML
115.0000 mg | Freq: Once | INTRAVENOUS | Status: AC
  Administered 2015-05-07: 115 mg via INTRAVENOUS
  Filled 2015-05-07: qty 20

## 2015-05-07 MED ORDER — SODIUM CHLORIDE 0.9 % IV SOLN
3050.0000 mg | INTRAVENOUS | Status: DC
  Administered 2015-05-07: 3050 mg via INTRAVENOUS
  Filled 2015-05-07: qty 61

## 2015-05-07 MED ORDER — OXALIPLATIN CHEMO INJECTION 100 MG/20ML: 60.0000 mg/m2 | Freq: Once | INTRAVENOUS | Status: DC

## 2015-05-07 MED ORDER — DEXTROSE 5 % IV SOLN
576.0000 mg | Freq: Once | INTRAVENOUS | Status: AC
  Administered 2015-05-07: 576 mg via INTRAVENOUS
  Filled 2015-05-07: qty 28.8

## 2015-05-07 MED ORDER — ESZOPICLONE 1 MG PO TABS: 1.0000 mg | ORAL_TABLET | Freq: Every evening | ORAL | Status: DC | PRN

## 2015-05-07 NOTE — Telephone Encounter (Signed)
per pof to sch pt appt-gave pt copy of avs-sent MW email to sh trmt & pump dc-pt to get updated copy 11/30 appt

## 2015-05-07 NOTE — Telephone Encounter (Signed)
Per staff message and POF I have scheduled appts. Advised scheduler of appts. JMW  

## 2015-05-07 NOTE — Progress Notes (Signed)
  Brianna Vasquez OFFICE PROGRESS NOTE   Diagnosis: Adenocarcinoma of the ovary  INTERVAL HISTORY:   Brianna Vasquez returns as scheduled. She was admitted 04/26/2015 with severe mucositis and pancytopenia. The mucositis improved while in the hospital. The pancytopenia also improved. She was transfused with packed red blood cells. She reports resolution of the mucositis symptoms. The left "hip "discomfort has improved. She requested a prescription for Lunesta for insomnia.  Objective:  Vital signs in last 24 hours:  Blood pressure 129/65, pulse 109, temperature 98.7 F (37.1 C), temperature source Oral, resp. rate 18, height $RemoveBe'5\' 5"'izkyNZbtV$  (1.651 m), weight 143 lb 1.6 oz (64.91 kg), SpO2 99 %.    HEENT: No thrush or ulcers, dryness of the lower lip Resp: Lungs clear bilaterally Cardio: Regular rate and rhythm GI: No hepatomegaly, nontender, no mass Vascular: No leg edema Neuro: The vibratory sense is intact at the fingertips bilaterally   Portacath/PICC-without erythema  Lab Results:  Lab Results  Component Value Date   WBC 7.0 05/07/2015   HGB 11.6 05/07/2015   HCT 36.5 05/07/2015   MCV 95.7 05/07/2015   PLT 300 05/07/2015   NEUTROABS 3.8 05/07/2015      Lab Results  Component Value Date   CEA 68.6* 04/10/2015   Medications: I have reviewed the patient's current medications.  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 Metastatic left periaortic adenopathy.  PET scan 02/06/2015 with extensive hypermetabolic lymphadenopathy in the chest, abdomen, and pelvis, Residual left pelvic tumor, sigmoid colon  lesion, liver/lung metastases, and a left acetabulum metastasis  Colonoscopy 02/09/2015 with a stricture at 30 cm, biopsy positive for adenocarcinoma, no intrinsic colon mass noted  Cycle 1 FOLFOX 02/14/2015  Cycle 2 FOLFOX 02/28/2015  CT 03/16/2015 with improvement in lung metastases, stable liver lesions and left pelvic soft tissue fullness, right ovarian vein thrombosis  Cycle 3 FOLFOX 03/26/2015 with Neulasta support  Cycle 4 FOLFOX 04/16/2015  Cycle 5 FOLFOX 05/07/2015 (oxaliplatin and 5-FU dose reduced, 5-FU bolus eliminated)  2. Placement of a left ureter stent 01/16/2015  3. Family history of multiple cancers, report of a paternal cousin-BRCA2 positive  4. Upper endoscopy 01/29/2015 with a localized thick gastric fold with a small ulcer  5. Mild swelling of the left foot and ankle 01/27/2015  6. Pain secondary to the left pelvic tumor and acetabulum metastasis  7. Acute/delayed nausea following FOLFOX-Aloxi and amend were added with cycle 2, prophylactic Decadron with cycle 3  8. Acute transient loss of bladder control with cycle 1 FOLFOX  9. Severe neutropenia following cycle 2 FOLFOX. Neulasta added beginning with cycle 3.  10. Admission 04/26/2015 with severe mucositis secondary to chemotherapy  11. History of Pancytopenia secondary chemotherapy-red cell transfusion 04/28/2015, platelet transfusion 04/28/2015   Disposition:  Brianna Vasquez appears well. The plan is to proceed with cycle 5 FOLFOX today. She would like to delay cycle 6 until after the Christmas holiday. She will be scheduled for cycle 6 on 06/06/2015. A restaging CT will be scheduled after cycle 6.  The 5-FU and oxaliplatin were dose reduced with this cycle.  Betsy Coder, MD  05/07/2015  2:21 PM

## 2015-05-07 NOTE — Patient Instructions (Signed)
[facility] Discharge Instructions for Patients Receiving Chemotherapy  Today you received the following chemotherapy agents Oxaliplatin/Leucovorin/5 FU  To help prevent nausea and vomiting after your treatment, we encourage you to take your nausea medication as prescribed.   If you develop nausea and vomiting that is not controlled by your nausea medication, call the clinic.   BELOW ARE SYMPTOMS THAT SHOULD BE REPORTED IMMEDIATELY:  *FEVER GREATER THAN 100.5 F  *CHILLS WITH OR WITHOUT FEVER  NAUSEA AND VOMITING THAT IS NOT CONTROLLED WITH YOUR NAUSEA MEDICATION  *UNUSUAL SHORTNESS OF BREATH  *UNUSUAL BRUISING OR BLEEDING  TENDERNESS IN MOUTH AND THROAT WITH OR WITHOUT PRESENCE OF ULCERS  *URINARY PROBLEMS  *BOWEL PROBLEMS  UNUSUAL RASH Items with * indicate a potential emergency and should be followed up as soon as possible.  Feel free to call the clinic you have any questions or concerns. The clinic phone number is [phone].  Please show the CHEMO ALERT CARD at check-in to the Emergency Department and triage nurse.   

## 2015-05-08 ENCOUNTER — Telehealth: Payer: Self-pay | Admitting: Oncology

## 2015-05-08 ENCOUNTER — Telehealth: Payer: Self-pay | Admitting: *Deleted

## 2015-05-08 LAB — CEA: CEA: 71.9 ng/mL — AB (ref 0.0–5.0)

## 2015-05-08 NOTE — Telephone Encounter (Signed)
"  Brianna Vasquez with [facility] calling requesting order to continue Physical Therapy care.  Please call [phone] with any orders."

## 2015-05-08 NOTE — Telephone Encounter (Signed)
Patient called inquiring about IVF's tomorrow and lab 12/9. IVF's already on schedule 11/30 - adjusted other appointment times for 11/30 and added lab 12/9. Patient aware and will get new schedule tomorrow.

## 2015-05-09 ENCOUNTER — Ambulatory Visit (HOSPITAL_BASED_OUTPATIENT_CLINIC_OR_DEPARTMENT_OTHER): Payer: BLUE CROSS/BLUE SHIELD

## 2015-05-09 ENCOUNTER — Other Ambulatory Visit: Payer: Self-pay | Admitting: *Deleted

## 2015-05-09 ENCOUNTER — Ambulatory Visit: Payer: BLUE CROSS/BLUE SHIELD

## 2015-05-09 VITALS — BP 109/62 | HR 118 | Temp 98.7°F | Resp 18

## 2015-05-09 DIAGNOSIS — C562 Malignant neoplasm of left ovary: Secondary | ICD-10-CM | POA: Diagnosis not present

## 2015-05-09 DIAGNOSIS — D701 Agranulocytosis secondary to cancer chemotherapy: Secondary | ICD-10-CM | POA: Diagnosis not present

## 2015-05-09 DIAGNOSIS — D61810 Antineoplastic chemotherapy induced pancytopenia: Secondary | ICD-10-CM

## 2015-05-09 MED ORDER — SODIUM CHLORIDE 0.9 % IV SOLN
Freq: Once | INTRAVENOUS | Status: AC
  Administered 2015-05-09: 14:00:00 via INTRAVENOUS

## 2015-05-09 MED ORDER — SODIUM CHLORIDE 0.9 % IJ SOLN
10.0000 mL | INTRAMUSCULAR | Status: DC | PRN
  Administered 2015-05-09: 10 mL
  Filled 2015-05-09: qty 10

## 2015-05-09 MED ORDER — PEGFILGRASTIM INJECTION 6 MG/0.6ML ~~LOC~~
6.0000 mg | PREFILLED_SYRINGE | Freq: Once | SUBCUTANEOUS | Status: AC
  Administered 2015-05-09: 6 mg via SUBCUTANEOUS
  Filled 2015-05-09: qty 0.6

## 2015-05-09 MED ORDER — HEPARIN SOD (PORK) LOCK FLUSH 100 UNIT/ML IV SOLN
500.0000 [IU] | Freq: Once | INTRAVENOUS | Status: AC | PRN
  Administered 2015-05-09: 500 [IU]
  Filled 2015-05-09: qty 5

## 2015-05-09 NOTE — Telephone Encounter (Signed)
Per Dr. Benay Spice; gave verbal order to Lorre Nick, RN @ [facility] to continue PT for pt.  Lorre Nick, RN verbalized understanding and states she will make [facility] aware.

## 2015-05-09 NOTE — Patient Instructions (Signed)

## 2015-05-18 ENCOUNTER — Other Ambulatory Visit (HOSPITAL_BASED_OUTPATIENT_CLINIC_OR_DEPARTMENT_OTHER): Payer: BLUE CROSS/BLUE SHIELD

## 2015-05-18 ENCOUNTER — Telehealth: Payer: Self-pay | Admitting: *Deleted

## 2015-05-18 DIAGNOSIS — C562 Malignant neoplasm of left ovary: Secondary | ICD-10-CM

## 2015-05-18 LAB — CBC WITH DIFFERENTIAL/PLATELET
BASO%: 0.2 % (ref 0.0–2.0)
Basophils Absolute: 0 10*3/uL (ref 0.0–0.1)
EOS%: 1.7 % (ref 0.0–7.0)
Eosinophils Absolute: 0.2 10*3/uL (ref 0.0–0.5)
HCT: 38.6 % (ref 34.8–46.6)
HGB: 12.5 g/dL (ref 11.6–15.9)
LYMPH%: 22.2 % (ref 14.0–49.7)
MCH: 31.7 pg (ref 25.1–34.0)
MCHC: 32.3 g/dL (ref 31.5–36.0)
MCV: 98 fL (ref 79.5–101.0)
MONO#: 1.5 10*3/uL — ABNORMAL HIGH (ref 0.1–0.9)
MONO%: 15.2 % — ABNORMAL HIGH (ref 0.0–14.0)
NEUT#: 6.2 10*3/uL (ref 1.5–6.5)
NEUT%: 60.7 % (ref 38.4–76.8)
Platelets: 175 10*3/uL (ref 145–400)
RBC: 3.93 10*6/uL (ref 3.70–5.45)
RDW: 24 % — ABNORMAL HIGH (ref 11.2–14.5)
WBC: 10.2 10*3/uL (ref 3.9–10.3)
lymph#: 2.3 10*3/uL (ref 0.9–3.3)

## 2015-05-18 NOTE — Telephone Encounter (Signed)
Pt called requesting lab results.  Per Dr. Benay Spice; left voice message that labs were normal; looked good; f/u as scheduled.  Call office if questions

## 2015-05-21 ENCOUNTER — Telehealth: Payer: Self-pay | Admitting: *Deleted

## 2015-05-21 DIAGNOSIS — C562 Malignant neoplasm of left ovary: Secondary | ICD-10-CM

## 2015-05-21 DIAGNOSIS — C801 Malignant (primary) neoplasm, unspecified: Secondary | ICD-10-CM

## 2015-05-21 NOTE — Telephone Encounter (Signed)
Returned call to pt informed her Dr. Benay Spice will order scan to be done prior to office visit on 12/28. She understands radiology will contact her with appointment.

## 2015-05-21 NOTE — Telephone Encounter (Signed)
FYI  Voicemail from patient asking about scheduling of PET scan.  "Would like PET scheduled before next chemotherapy.  Want results when I come in on 06-06-2015 and get it covered with this years insurance.  Please call [phone]."  After review of last office note scan will be performed after cycle six received.  Cycle six will be after holidays per patient request.  Called reaching voicemail.  This information left on patient identified voicemail.  Will notify provider of this request as she mentioned insurance coverage.

## 2015-05-22 ENCOUNTER — Other Ambulatory Visit: Payer: Self-pay | Admitting: Oncology

## 2015-05-23 NOTE — Telephone Encounter (Signed)
Called pt to discuss rationale for completing Cycle 6 prior to restaging. Pt stated she would rather have scans prior to next visit/ treatment because her hip pain has returned. Discussed with Dr. Benay Spice: Order received, entered.

## 2015-05-23 NOTE — Addendum Note (Signed)
Addended by: Brien Few on: 05/23/2015 04:18 PM   Modules accepted: Orders

## 2015-05-24 MED ORDER — ONDANSETRON HCL 8 MG PO TABS
ORAL_TABLET | ORAL | Status: AC
  Filled 2015-05-24: qty 1

## 2015-05-29 MED ORDER — ONDANSETRON HCL 8 MG PO TABS
ORAL_TABLET | ORAL | Status: AC
  Filled 2015-05-29: qty 1

## 2015-06-04 ENCOUNTER — Other Ambulatory Visit: Payer: Self-pay | Admitting: Oncology

## 2015-06-05 ENCOUNTER — Encounter (HOSPITAL_COMMUNITY): Payer: Self-pay

## 2015-06-05 ENCOUNTER — Ambulatory Visit (HOSPITAL_COMMUNITY)
Admission: RE | Admit: 2015-06-05 | Discharge: 2015-06-05 | Disposition: A | Discharge status: Home / Self Care | Payer: BLUE CROSS/BLUE SHIELD | Source: Ambulatory Visit | Attending: Oncology | Admitting: Oncology

## 2015-06-05 DIAGNOSIS — C562 Malignant neoplasm of left ovary: Secondary | ICD-10-CM | POA: Diagnosis present

## 2015-06-05 DIAGNOSIS — K5730 Diverticulosis of large intestine without perforation or abscess without bleeding: Secondary | ICD-10-CM | POA: Diagnosis not present

## 2015-06-05 DIAGNOSIS — M25552 Pain in left hip: Secondary | ICD-10-CM | POA: Insufficient documentation

## 2015-06-05 DIAGNOSIS — N135 Crossing vessel and stricture of ureter without hydronephrosis: Secondary | ICD-10-CM | POA: Insufficient documentation

## 2015-06-05 DIAGNOSIS — Z90710 Acquired absence of both cervix and uterus: Secondary | ICD-10-CM | POA: Diagnosis not present

## 2015-06-05 DIAGNOSIS — C801 Malignant (primary) neoplasm, unspecified: Secondary | ICD-10-CM

## 2015-06-05 DIAGNOSIS — C787 Secondary malignant neoplasm of liver and intrahepatic bile duct: Secondary | ICD-10-CM | POA: Insufficient documentation

## 2015-06-05 DIAGNOSIS — Z9049 Acquired absence of other specified parts of digestive tract: Secondary | ICD-10-CM | POA: Diagnosis not present

## 2015-06-05 DIAGNOSIS — R590 Localized enlarged lymph nodes: Secondary | ICD-10-CM | POA: Diagnosis not present

## 2015-06-05 DIAGNOSIS — C7800 Secondary malignant neoplasm of unspecified lung: Secondary | ICD-10-CM | POA: Insufficient documentation

## 2015-06-05 DIAGNOSIS — M79605 Pain in left leg: Secondary | ICD-10-CM | POA: Diagnosis not present

## 2015-06-05 DIAGNOSIS — C7951 Secondary malignant neoplasm of bone: Secondary | ICD-10-CM | POA: Insufficient documentation

## 2015-06-05 MED ORDER — IOHEXOL 300 MG/ML  SOLN
100.0000 mL | Freq: Once | INTRAMUSCULAR | Status: AC | PRN
  Administered 2015-06-05: 100 mL via INTRAVENOUS

## 2015-06-06 ENCOUNTER — Ambulatory Visit: Payer: BLUE CROSS/BLUE SHIELD

## 2015-06-06 ENCOUNTER — Ambulatory Visit
Admission: RE | Admit: 2015-06-06 | Discharge: 2015-06-06 | Disposition: A | Discharge status: Home / Self Care | Payer: BLUE CROSS/BLUE SHIELD | Source: Ambulatory Visit | Attending: Radiation Oncology | Admitting: Radiation Oncology

## 2015-06-06 ENCOUNTER — Ambulatory Visit (HOSPITAL_COMMUNITY)
Admission: RE | Admit: 2015-06-06 | Discharge: 2015-06-06 | Disposition: A | Discharge status: Home / Self Care | Payer: BLUE CROSS/BLUE SHIELD | Source: Ambulatory Visit | Attending: Oncology | Admitting: Oncology

## 2015-06-06 ENCOUNTER — Other Ambulatory Visit: Payer: Self-pay | Admitting: *Deleted

## 2015-06-06 ENCOUNTER — Encounter: Payer: Self-pay | Admitting: Radiation Oncology

## 2015-06-06 ENCOUNTER — Ambulatory Visit (HOSPITAL_BASED_OUTPATIENT_CLINIC_OR_DEPARTMENT_OTHER): Payer: BLUE CROSS/BLUE SHIELD

## 2015-06-06 ENCOUNTER — Encounter (HOSPITAL_COMMUNITY): Payer: Self-pay

## 2015-06-06 ENCOUNTER — Other Ambulatory Visit (HOSPITAL_BASED_OUTPATIENT_CLINIC_OR_DEPARTMENT_OTHER): Payer: BLUE CROSS/BLUE SHIELD

## 2015-06-06 ENCOUNTER — Ambulatory Visit (HOSPITAL_BASED_OUTPATIENT_CLINIC_OR_DEPARTMENT_OTHER): Payer: BLUE CROSS/BLUE SHIELD | Admitting: Oncology

## 2015-06-06 VITALS — BP 116/65 | HR 107 | Temp 98.5°F | Resp 17 | Ht 65.0 in | Wt 149.2 lb

## 2015-06-06 VITALS — BP 93/54 | HR 98 | Temp 99.1°F | Ht 65.0 in | Wt 150.1 lb

## 2015-06-06 DIAGNOSIS — M7989 Other specified soft tissue disorders: Secondary | ICD-10-CM | POA: Diagnosis present

## 2015-06-06 DIAGNOSIS — Z803 Family history of malignant neoplasm of breast: Secondary | ICD-10-CM | POA: Insufficient documentation

## 2015-06-06 DIAGNOSIS — Z800 Family history of malignant neoplasm of digestive organs: Secondary | ICD-10-CM | POA: Insufficient documentation

## 2015-06-06 DIAGNOSIS — C7951 Secondary malignant neoplasm of bone: Secondary | ICD-10-CM | POA: Insufficient documentation

## 2015-06-06 DIAGNOSIS — Z510 Encounter for antineoplastic radiation therapy: Secondary | ICD-10-CM | POA: Insufficient documentation

## 2015-06-06 DIAGNOSIS — C5702 Malignant neoplasm of left fallopian tube: Secondary | ICD-10-CM

## 2015-06-06 DIAGNOSIS — C562 Malignant neoplasm of left ovary: Secondary | ICD-10-CM

## 2015-06-06 DIAGNOSIS — C7800 Secondary malignant neoplasm of unspecified lung: Secondary | ICD-10-CM

## 2015-06-06 DIAGNOSIS — C787 Secondary malignant neoplasm of liver and intrahepatic bile duct: Secondary | ICD-10-CM | POA: Diagnosis not present

## 2015-06-06 DIAGNOSIS — Z95828 Presence of other vascular implants and grafts: Secondary | ICD-10-CM

## 2015-06-06 DIAGNOSIS — M25552 Pain in left hip: Secondary | ICD-10-CM

## 2015-06-06 DIAGNOSIS — Z801 Family history of malignant neoplasm of trachea, bronchus and lung: Secondary | ICD-10-CM | POA: Insufficient documentation

## 2015-06-06 DIAGNOSIS — Z87891 Personal history of nicotine dependence: Secondary | ICD-10-CM | POA: Insufficient documentation

## 2015-06-06 DIAGNOSIS — F1010 Alcohol abuse, uncomplicated: Secondary | ICD-10-CM | POA: Insufficient documentation

## 2015-06-06 DIAGNOSIS — C569 Malignant neoplasm of unspecified ovary: Secondary | ICD-10-CM | POA: Insufficient documentation

## 2015-06-06 LAB — CBC WITH DIFFERENTIAL/PLATELET
BASO%: 0.8 % (ref 0.0–2.0)
Basophils Absolute: 0.1 10*3/uL (ref 0.0–0.1)
EOS%: 3.3 % (ref 0.0–7.0)
Eosinophils Absolute: 0.4 10*3/uL (ref 0.0–0.5)
HCT: 31.5 % — ABNORMAL LOW (ref 34.8–46.6)
HGB: 10.3 g/dL — ABNORMAL LOW (ref 11.6–15.9)
LYMPH%: 14.8 % (ref 14.0–49.7)
MCH: 32 pg (ref 25.1–34.0)
MCHC: 32.6 g/dL (ref 31.5–36.0)
MCV: 98 fL (ref 79.5–101.0)
MONO#: 1.7 10*3/uL — ABNORMAL HIGH (ref 0.1–0.9)
MONO%: 13.3 % (ref 0.0–14.0)
NEUT%: 67.8 % (ref 38.4–76.8)
NEUTROS ABS: 8.5 10*3/uL — AB (ref 1.5–6.5)
PLATELETS: 297 10*3/uL (ref 145–400)
RBC: 3.22 10*6/uL — AB (ref 3.70–5.45)
RDW: 19 % — ABNORMAL HIGH (ref 11.2–14.5)
WBC: 12.5 10*3/uL — AB (ref 3.9–10.3)
lymph#: 1.8 10*3/uL (ref 0.9–3.3)

## 2015-06-06 LAB — COMPREHENSIVE METABOLIC PANEL
ALBUMIN: 2.8 g/dL — AB (ref 3.5–5.0)
ALT: 17 U/L (ref 0–55)
AST: 39 U/L — ABNORMAL HIGH (ref 5–34)
Alkaline Phosphatase: 304 U/L — ABNORMAL HIGH (ref 40–150)
Anion Gap: 9 mEq/L (ref 3–11)
BILIRUBIN TOTAL: 1.79 mg/dL — AB (ref 0.20–1.20)
BUN: 7.4 mg/dL (ref 7.0–26.0)
CALCIUM: 9.6 mg/dL (ref 8.4–10.4)
CO2: 23 mEq/L (ref 22–29)
Chloride: 105 mEq/L (ref 98–109)
Creatinine: 0.7 mg/dL (ref 0.6–1.1)
GLUCOSE: 97 mg/dL (ref 70–140)
Potassium: 3.9 mEq/L (ref 3.5–5.1)
SODIUM: 137 meq/L (ref 136–145)
TOTAL PROTEIN: 7.1 g/dL (ref 6.4–8.3)

## 2015-06-06 LAB — CEA: CEA: 132.6 ng/mL — ABNORMAL HIGH (ref 0.0–5.0)

## 2015-06-06 MED ORDER — SODIUM CHLORIDE 0.9 % IJ SOLN
10.0000 mL | INTRAMUSCULAR | Status: DC | PRN
  Administered 2015-06-06: 10 mL via INTRAVENOUS
  Filled 2015-06-06: qty 10

## 2015-06-06 MED ORDER — HYDROMORPHONE HCL 4 MG PO TABS: ORAL_TABLET | ORAL | Status: DC

## 2015-06-06 MED ORDER — HEPARIN SOD (PORK) LOCK FLUSH 100 UNIT/ML IV SOLN
500.0000 [IU] | Freq: Once | INTRAVENOUS | Status: AC
  Administered 2015-06-06: 500 [IU] via INTRAVENOUS
  Filled 2015-06-06: qty 5

## 2015-06-06 MED ORDER — ESZOPICLONE 1 MG PO TABS: 1.0000 mg | ORAL_TABLET | Freq: Every evening | ORAL | Status: DC | PRN

## 2015-06-06 MED ORDER — ALPRAZOLAM 0.5 MG PO TABS: 0.5000 mg | ORAL_TABLET | Freq: Three times a day (TID) | ORAL | Status: DC | PRN

## 2015-06-06 MED ORDER — ONDANSETRON HCL 8 MG PO TABS: 8.0000 mg | ORAL_TABLET | Freq: Three times a day (TID) | ORAL | Status: DC

## 2015-06-06 NOTE — Progress Notes (Signed)
VASCULAR LAB PRELIMINARY  PRELIMINARY  PRELIMINARY  PRELIMINARY  Left lower extremity venous duplex completed.    Preliminary report:  Left:  No evidence of DVT, superficial thrombosis, or Baker's cyst..    Zohair Epp, RVT 06/06/2015, 1:48 PM

## 2015-06-06 NOTE — Progress Notes (Signed)
Treatment canceled today per Saman Umstead RN, pt is to have port deaccessed only. Pt stable at time of discharge.

## 2015-06-06 NOTE — Progress Notes (Signed)
Histology and Location of Primary Cancer: Adenocarcinoma of the ovary  Sites of Visceral and Bony Metastatic Disease: Left Hip  Location(s) of Symptomatic Metastases: Left Hip    Pain on a scale of 0-10 is: Currently she rates her pain a 5/10 with 5 mgoxycodone taken one hour ago. When she has not taken pain medicine it is a 10/10. Dr. Benay Spice provided her with a prescription for dilaudid today.      Ambulatory status? Walker? Wheelchair?: She is currently in a wheelchair, but states she is able to walk with assistance, but not very far.   SAFETY ISSUES:  Prior radiation? No  Pacemaker/ICD? No  Possible current pregnancy? No  Is the patient on methotrexate? No  Current Complaints / other details: None  BP 93/54 mmHg  Pulse 98  Temp(Src) 99.1 F (37.3 C)  Ht 5\' 5"  (1.651 m)  Wt 150 lb 1.6 oz (68.085 kg)  BMI 24.98 kg/m2   Wt Readings from Last 3 Encounters:  06/06/15 150 lb 1.6 oz (68.085 kg)  06/06/15 149 lb 3.2 oz (67.677 kg)  05/07/15 143 lb 1.6 oz (64.91 kg)

## 2015-06-06 NOTE — Progress Notes (Signed)
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 Bone metastases (HCC) 198.5 C79.51     HISTORY OF PRESENT ILLNESS::Brianna Vasquez is a 54 y.o. female who presented with metastatic ovarian cancer earlier this year.  She has visceral metastases and is receiving chemotherapy.  Her most recent cycle was on 11-28 and chemotherapy is temporarily on hold. She has had progressive severe left hip pain x 1 mo, affecting her walking. It is worse with hip flexion/extension and pressure on left side. Corresponds to a left acetabular metastasis on recent CT imaging.  PREVIOUS RADIATION THERAPY: No  PAST MEDICAL HISTORY:  has a past medical history of Wears glasses; Diverticulosis; Esophagus hernia; GERD (gastroesophageal reflux disease); Anemia; Headache; Cancer ([place]); and Anxiety.    PAST SURGICAL HISTORY: Past Surgical History  Procedure Laterality Date  . Wisdom tooth extraction  ~2001  . Cholecystectomy  2007    lap choli  . Breast lumpectomy Right 10/2013  . Vaginal hysterectomy  2005  . Colonoscopy  2016  . Laparotomy N/A 01/16/2015    Procedure: EXPLORATORY LAPAROTOMY ;  Surgeon: Nancy Marus, MD;  Location: [facility];  Service: Gynecology;  Laterality: N/A;  . Oophorectomy Bilateral 01/16/2015    Procedure: BILATERAL OOPHORECTOMY RESECTION OF PELVIC MASS ;  Surgeon: Nancy Marus, MD;  Location: [facility];  Service: Gynecology;  Laterality: Bilateral;  . Cystoscopy w/ ureteral stent placement Left 01/16/2015    Procedure: CYSTOSCOPY WITH RETROGRADE PYELOGRAM/URETERAL STENT PLACEMENT;  Surgeon: Carolan Clines, MD;  Location: [facility];  Service: Urology;  Laterality: Left;  . Porta cath insertion     . Colonoscopy with propofol N/A 02/09/2015    Procedure: COLONOSCOPY WITH PROPOFOL;  Surgeon: Ronald Lobo, MD;  Location: [facility];  Service: Endoscopy;  Laterality: N/A;    FAMILY HISTORY: family history includes Alzheimer's disease in her paternal grandfather; Breast cancer in her cousin and paternal aunt; Breast cancer (age of onset: 32) in her paternal grandmother; Cancer in her paternal grandmother; Colon cancer (age of onset: 24) in her maternal grandfather; Colon polyps in her mother; Lung cancer in her cousin; Other in her mother and sister; Pancreatic cancer in her other and other; Pancreatic cancer (age of onset: 5) in her father; Skin cancer in her mother; Throat cancer in her cousin.  SOCIAL HISTORY:  reports that she quit smoking about 29 years ago. Her smoking use included Cigarettes. She quit after 4 years of use. She has never used smokeless tobacco. She reports that she drinks about 4.2 oz of alcohol per week. She reports that she does not use illicit drugs.  ALLERGIES: Other; Ativan; and Darvon  MEDICATIONS:  Current Outpatient Prescriptions  Medication Sig Dispense Refill  . ALPRAZolam (XANAX) 0.5 MG tablet Take 1 tablet (0.5 mg total) by mouth every 8 (eight) hours as needed. for anxiety 30 tablet 0  . Ascorbic Acid (VITAMIN C) 1000 MG tablet Take 1,000 mg by mouth daily.    . B Complex Vitamins (B COMPLEX PO) Take 1 tablet by mouth daily.    . calcium carbonate (TUMS - DOSED IN MG ELEMENTAL CALCIUM) 500 MG chewable tablet Chew 1-2 tablets by mouth 3 (three) times daily as  needed for indigestion or heartburn.    . dexamethasone 0.5 MG/5ML elixir Take 5 mls PO QID PRN mucositis. 80 mL 1  . ECHINACEA PO Take 1 tablet by mouth daily.    Marland Kitchen HYDROmorphone (DILAUDID) 4 MG tablet Take 1-2 tab every 4 hours as needed for pain 75 tablet 0  . lidocaine-prilocaine (EMLA) cream Apply to port site one hour prior to use. Do not rub in. Cover with plastic. (Patient taking  differently: Apply 1 application topically daily as needed (for port access). Apply to port site one hour prior to use. Do not rub in. Cover with plastic.) 30 g 0  . loperamide (IMODIUM) 1 MG/5ML solution Take 10 mLs (2 mg total) by mouth as needed for diarrhea or loose stools. 120 mL 0  . oxyCODONE (OXY IR/ROXICODONE) 5 MG immediate release tablet Take 1-2 tabs every 4 hours as needed for pain 30 tablet 0  . POTASSIUM PO Take 1 tablet by mouth daily.    Marland Kitchen PRESCRIPTION MEDICATION Chemo at [place] [facility] through pump for 2 days at home and gets pump connected after Chemo IV at [facility] and then comes back to get it disconnected after the 2 days    . prochlorperazine (COMPAZINE) 10 MG tablet Take 10 mg by mouth daily as needed for nausea. Reported on 06/06/2015  1  . sucralfate (CARAFATE) 1 GM/10ML suspension Take 10 mLs (1 g total) by mouth 4 (four) times daily as needed. 420 mL 0  . eszopiclone (LUNESTA) 1 MG TABS tablet Take 1 tablet (1 mg total) by mouth at bedtime as needed for sleep. Take immediately before bedtime 30 tablet 0  . fluconazole (DIFLUCAN) 100 MG tablet Take 1 tablet (100 mg total) by mouth daily. (Patient not taking: Reported on 06/06/2015) 14 tablet 1  . HYDROcodone-acetaminophen (HYCET) 7.5-325 mg/15 ml solution Take 15 mLs by mouth every 6 (six) hours as needed for moderate pain. (Patient not taking: Reported on 06/06/2015) 118 mL 0  . magic mouthwash w/lidocaine SOLN Take 5 mLs by mouth 4 (four) times daily as needed for mouth pain ([facility] formula w/ nystatin and lidocaine 1:1.). (Patient not taking: Reported on 06/06/2015) 240 mL 1  . ondansetron (ZOFRAN ODT) 4 MG disintegrating tablet Take 1 tablet (4 mg total) by mouth every 8 (eight) hours as needed for nausea or vomiting. (Patient not taking: Reported on 06/06/2015) 20 tablet 0  . ondansetron (ZOFRAN) 8 MG tablet Take 1 tablet (8 mg total) by mouth every 8 (eight) hours. 90 tablet 0   No current facility-administered medications  for this encounter.    REVIEW OF SYSTEMS:  Notable for that above. Denies GU symptoms. Reports constipation.    PHYSICAL EXAM:  height is 5\' 5"  (1.651 m) and weight is 150 lb 1.6 oz (68.085 kg). Her temperature is 99.1 F (37.3 C). Her blood pressure is 93/54 and her pulse is 98.   General: Alert and oriented, in no acute distress - In WC HEENT: Head is normocephalic.   Neck: Neck is supple, no palpable cervical or supraclavicular lymphadenopathy. Heart: Regular in rate and rhythm with no murmurs, rubs, or gallops. Chest: Clear to auscultation bilaterally, with no rhonchi, wheezes, or rales. Abdomen: Soft, nontender,  with no rigidity or guarding. Extremities: No cyanosis or edema. Lymphatics: see Neck Exam Skin: No concerning lesions. Neurologic: Cranial nerves II through XII are grossly intact. No obvious focalities. Speech is fluent. Psychiatric: Judgment and insight are intact. Affect is appropriate. MSK: somewhat tender to palpation in  left hip region   ECOG = 2  0 - Asymptomatic (Fully active, able to carry on all predisease activities without restriction)  1 - Symptomatic but completely ambulatory (Restricted in physically strenuous activity but ambulatory and able to carry out work of a light or sedentary nature. For example, light housework, office work)  2 - Symptomatic, <50% in bed during the day (Ambulatory and capable of all self care but unable to carry out any work activities. Up and about more than 50% of waking hours)  3 - Symptomatic, >50% in bed, but not bedbound (Capable of only limited self-care, confined to bed or chair 50% or more of waking hours)  4 - Bedbound (Completely disabled. Cannot carry on any self-care. Totally confined to bed or chair)  5 - Death   Eustace Pen MM, Creech RH, Tormey DC, et al. [phone]). "Toxicity and response criteria of the [facility]". [place] Oncol. 5 (6): 649-55   LABORATORY DATA:  Lab Results  Component  Value Date   WBC 12.5* 06/06/2015   HGB 10.3* 06/06/2015   HCT 31.5* 06/06/2015   MCV 98.0 06/06/2015   PLT 297 06/06/2015   CMP     Component Value Date/Time   NA 137 06/06/2015 0808   NA 137 04/29/2015 1800   K 3.9 06/06/2015 0808   K 3.4* 04/29/2015 1800   CL 105 04/29/2015 1800   CO2 23 06/06/2015 0808   CO2 24 04/29/2015 1800   GLUCOSE 97 06/06/2015 0808   GLUCOSE 124* 04/29/2015 1800   BUN 7.4 06/06/2015 0808   BUN <5* 04/29/2015 1800   CREATININE 0.7 06/06/2015 0808   CREATININE 0.56 04/29/2015 1800   CALCIUM 9.6 06/06/2015 0808   CALCIUM 8.7* 04/29/2015 1800   PROT 7.1 06/06/2015 0808   PROT 7.4 01/11/2015 1100   ALBUMIN 2.8* 06/06/2015 0808   ALBUMIN 3.9 01/11/2015 1100   AST 39* 06/06/2015 0808   AST 23 01/11/2015 1100   ALT 17 06/06/2015 0808   ALT 17 01/11/2015 1100   ALKPHOS 304* 06/06/2015 0808   ALKPHOS 80 01/11/2015 1100   BILITOT 1.79* 06/06/2015 0808   BILITOT 0.7 01/11/2015 1100   GFRNONAA >60 04/29/2015 1800   GFRAA >60 04/29/2015 1800         RADIOGRAPHY: Ct Chest W Contrast  06/05/2015  CLINICAL DATA:  Subsequent encounter for ovarian cancer restaging. Personal history of liver metastases. Left hip and leg pain since August of this year. Prior hysterectomy and cholecystectomy. EXAM: CT CHEST, ABDOMEN, AND PELVIS WITH CONTRAST TECHNIQUE: Multidetector CT imaging of the chest, abdomen and pelvis was performed following the standard protocol during bolus administration of intravenous contrast. CONTRAST:  152mL OMNIPAQUE IOHEXOL 300 MG/ML  SOLN COMPARISON:  03/16/2015 abdomen and pelvis CT. PET-CT from 02/06/2015. FINDINGS: CT CHEST FINDINGS Mediastinum/Lymph Nodes: Right Port-A-Cath tip is at the junction of the SVC and RA. There is no axillary lymphadenopathy. No mediastinal lymphadenopathy. 10 mm short axis subcarinal lymph node is at upper limits of normal. The esophagus has normal imaging features. The heart size is normal. No pericardial effusion.  Lungs/Pleura: As the entire chest was not included on the most recent comparison CT, the PET-CT from 02/06/2015 is used for lung comparison. Many of the tiny irregular pulmonary nodules seen on the PET-CT have resolved in the interval. Some of the larger ill-defined nodules have decreased while others have increased. Index nodule in the central right lower lobe measures 11 mm today (image 34 series 4) and  this was 10 mm on 03/16/2015 and 15 mm on 02/06/2015. 9 mm nodule in the left upper lobe (image 14 series 4) was not included on the most recent study, but measured only 6 mm on 02/06/2015. Immediately anterior to this nodule is of 4-5 mm nodule today that measured about 6 mm on 02/06/2015. 13 mm of ill-defined opacity in the posterior right middle lobe, along the major fissure (image 31 series 4) was not seen on either of the previous 2 exams. No pulmonary edema or pleural effusion. Musculoskeletal: Bone windows reveal no worrisome lytic or sclerotic osseous lesions. CT ABDOMEN PELVIS FINDINGS Hepatobiliary: Hepatic metastases have progressed in the interval. 3.0 cm index lesion in the lateral segment left liver (image 48 series 2) was 1.6 cm on the most recent comparison study. 3.8 cm lesion in the medial segment left liver (image 52 series 2) was 1.0 cm previously. 2.5 cm lesion in the inferior right liver (image 75 series 2) was 0.7 cm previously. Gallbladder is surgically absent. No intrahepatic or extrahepatic biliary dilation. Pancreas: No focal mass lesion. No dilatation of the main duct. No intraparenchymal cyst. No peripancreatic edema. Spleen: Calcified granulomata noted within the parenchyma. Adrenals/Urinary Tract: No adrenal nodule or mass. Right kidney is normal in appearance. Left kidney is again noted to be atrophic with hydronephrosis. Proximal loop of a left ureteral stent is formed in the left renal pelvis. Left ureter remains dilated to the level of the iliac vessels. Distal loop of the left  ureteral stent is formed in the posterior bladder, adjacent to the UVJ. The urinary bladder appears normal for the degree of distention. Stomach/Bowel: Stomach is nondistended. No gastric wall thickening. No evidence of outlet obstruction. Duodenum is normally positioned as is the ligament of Treitz. No small bowel wall thickening. No small bowel dilatation. The terminal ileum is normal. The appendix is not visualized, but there is no edema or inflammation in the region of the cecum. Diverticular changes are noted in the left colon without evidence of diverticulitis. Vascular/Lymphatic: No abdominal aortic aneurysm. No gastrohepatic or hepatoduodenal ligament lymphadenopathy. Scattered mildly enlarged lymph nodes in the retroperitoneal space are stable. Abnormal enhancement seen previously in the left psoas muscle persists but is slightly less prominent today. This is associated with abnormal soft tissue attenuation just anterior to the psoas muscle which incorporates the left ureter and is the etiology of the left ureteral obstruction. Overall this abnormal soft tissue is less prominent, measuring 1.6 x 1.3 cm today compared to 3.0 x 2.7 cm previously. No evidence for pelvic sidewall lymphadenopathy. Reproductive: Uterus is surgically absent. No evidence for adnexal mass. Other: A very trace amount of free fluid is seen in the anatomic pelvis. Musculoskeletal: Interval increase in mixed sclerotic and lytic abnormality in the left acetabulum, compatible with the hypermetabolic metastatic disease seen on the previous PET-CT. IMPRESSION: 1. Mixed interval response of pulmonary metastases. Most of the ill-defined pulmonary nodules appear smaller or have resolved, but some are progressed, including a 13 mm nodule in the posterior right middle lobe. 2. Interval progression of hepatic metastases. 3. Interval decrease in abnormal soft tissue and abnormal enhancement associated with the left retroperitoneal space and left  psoas muscle. This is the site of left ureteral obstruction. 4. No substantial change in mild retroperitoneal lymphadenopathy seen to be hypermetabolic and recent PET-CT. 5. Interval development of bony changes in the left acetabulum consistent with the patient's known bony metastatic involvement seen on previous PET-CT. 6. The area of abnormal enhancement  seen in the sigmoid colon previous is less evident today. Diverticular changes are noted in the left colon without diverticulitis. Electronically Signed   By: Misty Stanley M.D.   On: 06/05/2015 16:25   Ct Abdomen Pelvis W Contrast  06/05/2015  CLINICAL DATA:  Subsequent encounter for ovarian cancer restaging. Personal history of liver metastases. Left hip and leg pain since August of this year. Prior hysterectomy and cholecystectomy. EXAM: CT CHEST, ABDOMEN, AND PELVIS WITH CONTRAST TECHNIQUE: Multidetector CT imaging of the chest, abdomen and pelvis was performed following the standard protocol during bolus administration of intravenous contrast. CONTRAST:  117mL OMNIPAQUE IOHEXOL 300 MG/ML  SOLN COMPARISON:  03/16/2015 abdomen and pelvis CT. PET-CT from 02/06/2015. FINDINGS: CT CHEST FINDINGS Mediastinum/Lymph Nodes: Right Port-A-Cath tip is at the junction of the SVC and RA. There is no axillary lymphadenopathy. No mediastinal lymphadenopathy. 10 mm short axis subcarinal lymph node is at upper limits of normal. The esophagus has normal imaging features. The heart size is normal. No pericardial effusion. Lungs/Pleura: As the entire chest was not included on the most recent comparison CT, the PET-CT from 02/06/2015 is used for lung comparison. Many of the tiny irregular pulmonary nodules seen on the PET-CT have resolved in the interval. Some of the larger ill-defined nodules have decreased while others have increased. Index nodule in the central right lower lobe measures 11 mm today (image 34 series 4) and this was 10 mm on 03/16/2015 and 15 mm on  02/06/2015. 9 mm nodule in the left upper lobe (image 14 series 4) was not included on the most recent study, but measured only 6 mm on 02/06/2015. Immediately anterior to this nodule is of 4-5 mm nodule today that measured about 6 mm on 02/06/2015. 13 mm of ill-defined opacity in the posterior right middle lobe, along the major fissure (image 31 series 4) was not seen on either of the previous 2 exams. No pulmonary edema or pleural effusion. Musculoskeletal: Bone windows reveal no worrisome lytic or sclerotic osseous lesions. CT ABDOMEN PELVIS FINDINGS Hepatobiliary: Hepatic metastases have progressed in the interval. 3.0 cm index lesion in the lateral segment left liver (image 48 series 2) was 1.6 cm on the most recent comparison study. 3.8 cm lesion in the medial segment left liver (image 52 series 2) was 1.0 cm previously. 2.5 cm lesion in the inferior right liver (image 75 series 2) was 0.7 cm previously. Gallbladder is surgically absent. No intrahepatic or extrahepatic biliary dilation. Pancreas: No focal mass lesion. No dilatation of the main duct. No intraparenchymal cyst. No peripancreatic edema. Spleen: Calcified granulomata noted within the parenchyma. Adrenals/Urinary Tract: No adrenal nodule or mass. Right kidney is normal in appearance. Left kidney is again noted to be atrophic with hydronephrosis. Proximal loop of a left ureteral stent is formed in the left renal pelvis. Left ureter remains dilated to the level of the iliac vessels. Distal loop of the left ureteral stent is formed in the posterior bladder, adjacent to the UVJ. The urinary bladder appears normal for the degree of distention. Stomach/Bowel: Stomach is nondistended. No gastric wall thickening. No evidence of outlet obstruction. Duodenum is normally positioned as is the ligament of Treitz. No small bowel wall thickening. No small bowel dilatation. The terminal ileum is normal. The appendix is not visualized, but there is no edema or  inflammation in the region of the cecum. Diverticular changes are noted in the left colon without evidence of diverticulitis. Vascular/Lymphatic: No abdominal aortic aneurysm. No gastrohepatic or hepatoduodenal ligament  lymphadenopathy. Scattered mildly enlarged lymph nodes in the retroperitoneal space are stable. Abnormal enhancement seen previously in the left psoas muscle persists but is slightly less prominent today. This is associated with abnormal soft tissue attenuation just anterior to the psoas muscle which incorporates the left ureter and is the etiology of the left ureteral obstruction. Overall this abnormal soft tissue is less prominent, measuring 1.6 x 1.3 cm today compared to 3.0 x 2.7 cm previously. No evidence for pelvic sidewall lymphadenopathy. Reproductive: Uterus is surgically absent. No evidence for adnexal mass. Other: A very trace amount of free fluid is seen in the anatomic pelvis. Musculoskeletal: Interval increase in mixed sclerotic and lytic abnormality in the left acetabulum, compatible with the hypermetabolic metastatic disease seen on the previous PET-CT. IMPRESSION: 1. Mixed interval response of pulmonary metastases. Most of the ill-defined pulmonary nodules appear smaller or have resolved, but some are progressed, including a 13 mm nodule in the posterior right middle lobe. 2. Interval progression of hepatic metastases. 3. Interval decrease in abnormal soft tissue and abnormal enhancement associated with the left retroperitoneal space and left psoas muscle. This is the site of left ureteral obstruction. 4. No substantial change in mild retroperitoneal lymphadenopathy seen to be hypermetabolic and recent PET-CT. 5. Interval development of bony changes in the left acetabulum consistent with the patient's known bony metastatic involvement seen on previous PET-CT. 6. The area of abnormal enhancement seen in the sigmoid colon previous is less evident today. Diverticular changes are noted  in the left colon without diverticulitis. Electronically Signed   By: Misty Stanley M.D.   On: 06/05/2015 16:25      IMPRESSION/PLAN: Today, I talked to the patient about the findings and work-up thus far. We discussed the patient's diagnosis of metastatic ovarian cancer to the left acetabulum and general treatment for this, highlighting the role of radiotherapy in the management. We discussed the available radiation techniques, and focused on the details of logistics and delivery.    We discussed the risks, benefits, and side effects of radiotherapy. Side effects may include but not necessarily be limited to: fatigue, skin irritation, uncommon GI upset. No guarantees of treatment were given. A consent form was signed and placed in the patient's medical record. The patient was encouraged to ask questions that I answered to the best of my ability.   Will simulate today for a single fraction of palliative RT to be given tomorrow.   __________________________________________   Eppie Gibson, MD

## 2015-06-06 NOTE — Progress Notes (Signed)
[facility] OFFICE PROGRESS NOTE   Diagnosis: Adenocarcinoma the ovary  INTERVAL HISTORY:   Brianna Vasquez returns as scheduled. She completed another cycle of FOLFOX on 05/07/2015. She reports tolerating the chemotherapy well. She did not have mouth sores or diarrhea following chemotherapy. She complains of increased pain at the left hip area. The pain is present despite taking 2 oxycodone tablets every 4 hours.  Objective:  Vital signs in last 24 hours:  Blood pressure 116/65, pulse 107, temperature 98.5 F (36.9 C), temperature source Oral, resp. rate 17, height 5\' 5"  (1.651 m), weight 149 lb 3.2 oz (67.677 kg), SpO2 99 %.    HEENT: No thrush or ulcers Resp: Lungs clear bilaterally Cardio: Regular rate and rhythm GI: No hepatomegaly, no mass, nontender Vascular: Trace edema at the left lower leg  Musculoskeletal: Pain with motion at the left hip   Portacath/PICC-without erythema  Lab Results:  Lab Results  Component Value Date   WBC 12.5* 06/06/2015   HGB 10.3* 06/06/2015   HCT 31.5* 06/06/2015   MCV 98.0 06/06/2015   PLT 297 06/06/2015   NEUTROABS 8.5* 06/06/2015      Lab Results  Component Value Date   CEA 71.9* 05/07/2015    Imaging:  Ct Chest W Contrast  06/05/2015  CLINICAL DATA:  Subsequent encounter for ovarian cancer restaging. Personal history of liver metastases. Left hip and leg pain since August of this year. Prior hysterectomy and cholecystectomy. EXAM: CT CHEST, ABDOMEN, AND PELVIS WITH CONTRAST TECHNIQUE: Multidetector CT imaging of the chest, abdomen and pelvis was performed following the standard protocol during bolus administration of intravenous contrast. CONTRAST:  September OMNIPAQUE IOHEXOL 300 MG/ML  SOLN COMPARISON:  03/16/2015 abdomen and pelvis CT. PET-CT from 02/06/2015. FINDINGS: CT CHEST FINDINGS Mediastinum/Lymph Nodes: Right Port-A-Cath tip is at the junction of the SVC and RA. There is no axillary lymphadenopathy. No  mediastinal lymphadenopathy. 10 mm short axis subcarinal lymph node is at upper limits of normal. The esophagus has normal imaging features. The heart size is normal. No pericardial effusion. Lungs/Pleura: As the entire chest was not included on the most recent comparison CT, the PET-CT from 02/06/2015 is used for lung comparison. Many of the tiny irregular pulmonary nodules seen on the PET-CT have resolved in the interval. Some of the larger ill-defined nodules have decreased while others have increased. Index nodule in the central right lower lobe measures 11 mm today (image 34 series 4) and this was 10 mm on 03/16/2015 and 15 mm on 02/06/2015. 9 mm nodule in the left upper lobe (image 14 series 4) was not included on the most recent study, but measured only 6 mm on 02/06/2015. Immediately anterior to this nodule is of 4-5 mm nodule today that measured about 6 mm on 02/06/2015. 13 mm of ill-defined opacity in the posterior right middle lobe, along the major fissure (image 31 series 4) was not seen on either of the previous 2 exams. No pulmonary edema or pleural effusion. Musculoskeletal: Bone windows reveal no worrisome lytic or sclerotic osseous lesions. CT ABDOMEN PELVIS FINDINGS Hepatobiliary: Hepatic metastases have progressed in the interval. 3.0 cm index lesion in the lateral segment left liver (image 48 series 2) was 1.6 cm on the most recent comparison study. 3.8 cm lesion in the medial segment left liver (image 52 series 2) was 1.0 cm previously. 2.5 cm lesion in the inferior right liver (image 75 series 2) was 0.7 cm previously. Gallbladder is surgically absent. No intrahepatic or extrahepatic biliary  dilation. Pancreas: No focal mass lesion. No dilatation of the main duct. No intraparenchymal cyst. No peripancreatic edema. Spleen: Calcified granulomata noted within the parenchyma. Adrenals/Urinary Tract: No adrenal nodule or mass. Right kidney is normal in appearance. Left kidney is again noted to be  atrophic with hydronephrosis. Proximal loop of a left ureteral stent is formed in the left renal pelvis. Left ureter remains dilated to the level of the iliac vessels. Distal loop of the left ureteral stent is formed in the posterior bladder, adjacent to the UVJ. The urinary bladder appears normal for the degree of distention. Stomach/Bowel: Stomach is nondistended. No gastric wall thickening. No evidence of outlet obstruction. Duodenum is normally positioned as is the ligament of Treitz. No small bowel wall thickening. No small bowel dilatation. The terminal ileum is normal. The appendix is not visualized, but there is no edema or inflammation in the region of the cecum. Diverticular changes are noted in the left colon without evidence of diverticulitis. Vascular/Lymphatic: No abdominal aortic aneurysm. No gastrohepatic or hepatoduodenal ligament lymphadenopathy. Scattered mildly enlarged lymph nodes in the retroperitoneal space are stable. Abnormal enhancement seen previously in the left psoas muscle persists but is slightly less prominent today. This is associated with abnormal soft tissue attenuation just anterior to the psoas muscle which incorporates the left ureter and is the etiology of the left ureteral obstruction. Overall this abnormal soft tissue is less prominent, measuring 1.6 x 1.3 cm today compared to 3.0 x 2.7 cm previously. No evidence for pelvic sidewall lymphadenopathy. Reproductive: Uterus is surgically absent. No evidence for adnexal mass. Other: A very trace amount of free fluid is seen in the anatomic pelvis. Musculoskeletal: Interval increase in mixed sclerotic and lytic abnormality in the left acetabulum, compatible with the hypermetabolic metastatic disease seen on the previous PET-CT. IMPRESSION: 1. Mixed interval response of pulmonary metastases. Most of the ill-defined pulmonary nodules appear smaller or have resolved, but some are progressed, including a 13 mm nodule in the posterior  right middle lobe. 2. Interval progression of hepatic metastases. 3. Interval decrease in abnormal soft tissue and abnormal enhancement associated with the left retroperitoneal space and left psoas muscle. This is the site of left ureteral obstruction. 4. No substantial change in mild retroperitoneal lymphadenopathy seen to be hypermetabolic and recent PET-CT. 5. Interval development of bony changes in the left acetabulum consistent with the patient's known bony metastatic involvement seen on previous PET-CT. 6. The area of abnormal enhancement seen in the sigmoid colon previous is less evident today. Diverticular changes are noted in the left colon without diverticulitis. Electronically Signed   By: Misty Stanley M.D.   On: 06/05/2015 16:25   Ct Abdomen Pelvis W Contrast  06/05/2015  CLINICAL DATA:  Subsequent encounter for ovarian cancer restaging. Personal history of liver metastases. Left hip and leg pain since August of this year. Prior hysterectomy and cholecystectomy. EXAM: CT CHEST, ABDOMEN, AND PELVIS WITH CONTRAST TECHNIQUE: Multidetector CT imaging of the chest, abdomen and pelvis was performed following the standard protocol during bolus administration of intravenous contrast. CONTRAST:  121m OMNIPAQUE IOHEXOL 300 MG/ML  SOLN COMPARISON:  03/16/2015 abdomen and pelvis CT. PET-CT from 02/06/2015. FINDINGS: CT CHEST FINDINGS Mediastinum/Lymph Nodes: Right Port-A-Cath tip is at the junction of the SVC and RA. There is no axillary lymphadenopathy. No mediastinal lymphadenopathy. 10 mm short axis subcarinal lymph node is at upper limits of normal. The esophagus has normal imaging features. The heart size is normal. No pericardial effusion. Lungs/Pleura: As the entire chest  was not included on the most recent comparison CT, the PET-CT from 02/06/2015 is used for lung comparison. Many of the tiny irregular pulmonary nodules seen on the PET-CT have resolved in the interval. Some of the larger ill-defined  nodules have decreased while others have increased. Index nodule in the central right lower lobe measures 11 mm today (image 34 series 4) and this was 10 mm on 03/16/2015 and 15 mm on 02/06/2015. 9 mm nodule in the left upper lobe (image 14 series 4) was not included on the most recent study, but measured only 6 mm on 02/06/2015. Immediately anterior to this nodule is of 4-5 mm nodule today that measured about 6 mm on 02/06/2015. 13 mm of ill-defined opacity in the posterior right middle lobe, along the major fissure (image 31 series 4) was not seen on either of the previous 2 exams. No pulmonary edema or pleural effusion. Musculoskeletal: Bone windows reveal no worrisome lytic or sclerotic osseous lesions. CT ABDOMEN PELVIS FINDINGS Hepatobiliary: Hepatic metastases have progressed in the interval. 3.0 cm index lesion in the lateral segment left liver (image 48 series 2) was 1.6 cm on the most recent comparison study. 3.8 cm lesion in the medial segment left liver (image 52 series 2) was 1.0 cm previously. 2.5 cm lesion in the inferior right liver (image 75 series 2) was 0.7 cm previously. Gallbladder is surgically absent. No intrahepatic or extrahepatic biliary dilation. Pancreas: No focal mass lesion. No dilatation of the main duct. No intraparenchymal cyst. No peripancreatic edema. Spleen: Calcified granulomata noted within the parenchyma. Adrenals/Urinary Tract: No adrenal nodule or mass. Right kidney is normal in appearance. Left kidney is again noted to be atrophic with hydronephrosis. Proximal loop of a left ureteral stent is formed in the left renal pelvis. Left ureter remains dilated to the level of the iliac vessels. Distal loop of the left ureteral stent is formed in the posterior bladder, adjacent to the UVJ. The urinary bladder appears normal for the degree of distention. Stomach/Bowel: Stomach is nondistended. No gastric wall thickening. No evidence of outlet obstruction. Duodenum is normally  positioned as is the ligament of Treitz. No small bowel wall thickening. No small bowel dilatation. The terminal ileum is normal. The appendix is not visualized, but there is no edema or inflammation in the region of the cecum. Diverticular changes are noted in the left colon without evidence of diverticulitis. Vascular/Lymphatic: No abdominal aortic aneurysm. No gastrohepatic or hepatoduodenal ligament lymphadenopathy. Scattered mildly enlarged lymph nodes in the retroperitoneal space are stable. Abnormal enhancement seen previously in the left psoas muscle persists but is slightly less prominent today. This is associated with abnormal soft tissue attenuation just anterior to the psoas muscle which incorporates the left ureter and is the etiology of the left ureteral obstruction. Overall this abnormal soft tissue is less prominent, measuring 1.6 x 1.3 cm today compared to 3.0 x 2.7 cm previously. No evidence for pelvic sidewall lymphadenopathy. Reproductive: Uterus is surgically absent. No evidence for adnexal mass. Other: A very trace amount of free fluid is seen in the anatomic pelvis. Musculoskeletal: Interval increase in mixed sclerotic and lytic abnormality in the left acetabulum, compatible with the hypermetabolic metastatic disease seen on the previous PET-CT. IMPRESSION: 1. Mixed interval response of pulmonary metastases. Most of the ill-defined pulmonary nodules appear smaller or have resolved, but some are progressed, including a 13 mm nodule in the posterior right middle lobe. 2. Interval progression of hepatic metastases. 3. Interval decrease in abnormal soft tissue and  abnormal enhancement associated with the left retroperitoneal space and left psoas muscle. This is the site of left ureteral obstruction. 4. No substantial change in mild retroperitoneal lymphadenopathy seen to be hypermetabolic and recent PET-CT. 5. Interval development of bony changes in the left acetabulum consistent with the  patient's known bony metastatic involvement seen on previous PET-CT. 6. The area of abnormal enhancement seen in the sigmoid colon previous is less evident today. Diverticular changes are noted in the left colon without diverticulitis. Electronically Signed   By: Misty Stanley M.D.   On: 06/05/2015 16:25   CT images reviewed with Ms. Mormino and her family  Medications: I have reviewed the patient's current medications.  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 Metastatic left periaortic adenopathy.  PET scan 02/06/2015 with extensive hypermetabolic lymphadenopathy in the chest, abdomen, and pelvis, Residual left pelvic tumor, sigmoid colon lesion, liver/lung metastases, and a left acetabulum metastasis  Colonoscopy 02/09/2015 with a stricture at 30 cm, biopsy positive for adenocarcinoma, no intrinsic colon mass noted  Cycle 1 FOLFOX 02/14/2015  Cycle 2 FOLFOX 02/28/2015  CT 03/16/2015 with improvement in lung metastases, stable liver lesions and left pelvic soft tissue fullness, right ovarian vein thrombosis  Cycle 3 FOLFOX 03/26/2015 with Neulasta support  Cycle 4 FOLFOX 04/16/2015  Cycle 5 FOLFOX 05/07/2015 (oxaliplatin and 5-FU dose reduced, 5-FU bolus eliminated)  CTs chest, abdomen, and pelvis on 06/05/2015-mixed response of lung metastases, progression of hepatic metastases, decreased soft tissue in the left retroperitoneal space and left psoas, increased mixed lytic/sclerotic metastasis at the left acetabulum  2. Placement of a left ureter stent 01/16/2015  3. Family history of multiple cancers, report of a  paternal cousin-BRCA2 positive  4. Upper endoscopy 01/29/2015 with a localized thick gastric fold with a small ulcer  5. Mild swelling of the left foot and ankle 01/27/2015  6. Pain secondary to the left pelvic tumor and acetabulum metastasis-progressive acetabulum metastasis on the CT 06/05/2015, referred for palliative radiation  7. Acute/delayed nausea following FOLFOX-Aloxi and amend were added with cycle 2, prophylactic Decadron with cycle 3  8. Acute transient loss of bladder control with cycle 1 FOLFOX  9. Severe neutropenia following cycle 2 FOLFOX. Neulasta added beginning with cycle 3.  10. Admission 04/26/2015 with severe mucositis secondary to chemotherapy  11. History of Pancytopenia secondary chemotherapy-red cell transfusion 04/28/2015, platelet transfusion 04/28/2015    Disposition:  Ms. Lovins has increased pain at the left hip. I referred her for palliative radiation to the acetabulum. I reviewed the restaging CTs with her. There has been a mixed response in the lungs, but there is significant progression of disease in the liver. I recommend discontinuing FOLFOX chemotherapy.  There is no clear "standard" best chemotherapy in this setting. I recommend FOLFIRI/Avastin. I plan to discuss this with the GYN oncology service.  I reviewed the potential toxicities associated with the FOLFIRI regimen including the chance for mucositis, diarrhea, alopecia, and hematologic toxicity. We discussed the allergic reaction, thromboembolic disease, bowel perforation, bleeding, delayed wound healing, CNS toxicity, and nephrotoxicity associated with Avastin. She agrees to proceed.  I gave her prescription for Dilaudid to use as needed for pain. She Vasquez contact us if this does not relieve  the pain. She Vasquez see Dr. Isidore Moos today to consider palliative radiation to the left acetabulum.  Ms. Richeson is scheduled for a first cycle of FOLFIRI/Avastin 06/14/2015. She Vasquez return for  an office visit and chemotherapy 06/28/2015.  Approximately 40 minutes were spent with the patient today. The majority of the time was used for counseling and coordination of care.  Betsy Coder, MD  06/06/2015  9:01 AM

## 2015-06-06 NOTE — Patient Instructions (Signed)
Irinotecan injection  What is this medicine?  IRINOTECAN (ir in oh TEE kan ) is a chemotherapy drug. It is used to treat colon and rectal cancer.  This medicine may be used for other purposes; ask your health care provider or pharmacist if you have questions.  What should I tell my health care provider before I take this medicine?  They need to know if you have any of these conditions:  -blood disorders  -dehydration  -diarrhea  -infection (especially a virus infection such as chickenpox, cold sores, or herpes)  -liver disease  -low blood counts, like low white cell, platelet, or red cell counts  -recent or ongoing radiation therapy  -an unusual or allergic reaction to irinotecan, sorbitol, other chemotherapy, other medicines, foods, dyes, or preservatives  -pregnant or trying to get pregnant  -breast-feeding  How should I use this medicine?  This drug is given as an infusion into a vein. It is administered in a hospital or clinic by a specially trained health care professional.  Talk to your pediatrician regarding the use of this medicine in children. Special care may be needed.  Overdosage: If you think you have taken too much of this medicine contact a poison control center or emergency room at once.  NOTE: This medicine is only for you. Do not share this medicine with others.  What if I miss a dose?  It is important not to miss your dose. Call your doctor or health care professional if you are unable to keep an appointment.  What may interact with this medicine?  Do not take this medicine with any of the following medications:  -atazanavir  -certain medicines for fungal infections like itraconazole and ketoconazole  -St. John's Wort  This medicine may also interact with the following medications:  -dexamethasone  -diuretics  -laxatives  -medicines for seizures like carbamazepine, mephobarbital, phenobarbital, phenytoin, primidone  -medicines to increase blood counts like filgrastim, pegfilgrastim,  sargramostim  -prochlorperazine  -vaccines  This list may not describe all possible interactions. Give your health care provider a list of all the medicines, herbs, non-prescription drugs, or dietary supplements you use. Also tell them if you smoke, drink alcohol, or use illegal drugs. Some items may interact with your medicine.  What should I watch for while using this medicine?  Your condition will be monitored carefully while you are receiving this medicine. You will need important blood work done while you are taking this medicine.  This drug may make you feel generally unwell. This is not uncommon, as chemotherapy can affect healthy cells as well as cancer cells. Report any side effects. Continue your course of treatment even though you feel ill unless your doctor tells you to stop.  In some cases, you may be given additional medicines to help with side effects. Follow all directions for their use.  You may get drowsy or dizzy. Do not drive, use machinery, or do anything that needs mental alertness until you know how this medicine affects you. Do not stand or sit up quickly, especially if you are an older patient. This reduces the risk of dizzy or fainting spells.  Call your doctor or health care professional for advice if you get a fever, chills or sore throat, or other symptoms of a cold or flu. Do not treat yourself. This drug decreases your body's ability to fight infections. Try to avoid being around people who are sick.  This medicine may increase your risk to bruise or bleed. Call your doctor   receiving this medicine. Avoid taking products that contain aspirin, acetaminophen, ibuprofen, naproxen, or ketoprofen unless instructed by your doctor. These medicines may  hide a fever. Do not become pregnant while taking this medicine. Women should inform their doctor if they wish to become pregnant or think they might be pregnant. There is a potential for serious side effects to an unborn child. Talk to your health care professional or pharmacist for more information. Do not breast-feed an infant while taking this medicine. What side effects may I notice from receiving this medicine? Side effects that you should report to your doctor or health care professional as soon as possible: -allergic reactions like skin rash, itching or hives, swelling of the face, lips, or tongue -low blood counts - this medicine may decrease the number of white blood cells, red blood cells and platelets. You may be at increased risk for infections and bleeding. -signs of infection - fever or chills, cough, sore throat, pain or difficulty passing urine -signs of decreased platelets or bleeding - bruising, pinpoint red spots on the skin, black, tarry stools, blood in the urine -signs of decreased red blood cells - unusually weak or tired, fainting spells, lightheadedness -breathing problems -chest pain -diarrhea -feeling faint or lightheaded, falls -flushing, runny nose, sweating during infusion -mouth sores or pain -pain, swelling, redness or irritation where injected -pain, swelling, warmth in the leg -pain, tingling, numbness in the hands or feet -problems with balance, talking, walking -stomach cramps, pain -trouble passing urine or change in the amount of urine -vomiting as to be unable to hold down drinks or food -yellowing of the eyes or skin Side effects that usually do not require medical attention (report to your doctor or health care professional if they continue or are bothersome): -constipation -hair loss -headache -loss of appetite -nausea, vomiting -stomach upset This list may not describe all possible side effects. Call your doctor for medical advice about side  effects. You may report side effects to FDA at 1-800-FDA-1088. Where should I keep my medicine? This drug is given in a hospital or clinic and will not be stored at home. NOTE: This sheet is a summary. It may not cover all possible information. If you have questions about this medicine, talk to your doctor, pharmacist, or health care provider.    2016, Elsevier/Gold Standard. (2012-11-22 16:29:32)   Bevacizumab injection What is this medicine? BEVACIZUMAB (be va SIZ yoo mab) is a monoclonal antibody. It is used to treat cervical cancer, colorectal cancer, glioblastoma multiforme, non-small cell lung cancer (NSCLC), ovarian cancer, and renal cell cancer. This medicine may be used for other purposes; ask your health care provider or pharmacist if you have questions. What should I tell my health care provider before I take this medicine? They need to know if you have any of these conditions: -blood clots -heart disease, including heart failure, heart attack, or chest pain (angina) -high blood pressure -infection (especially a virus infection such as chickenpox, cold sores, or herpes) -kidney disease -lung disease -prior chemotherapy with doxorubicin, daunorubicin, epirubicin, or other anthracycline type chemotherapy agents -recent or ongoing radiation therapy -recent surgery -stroke -an unusual or allergic reaction to bevacizumab, hamster proteins, mouse proteins, other medicines, foods, dyes, or preservatives -pregnant or trying to get pregnant -breast-feeding How should I use this medicine? This medicine is for infusion into a vein. It is given by a health care professional in a hospital or clinic setting. Talk to your pediatrician regarding the use of this medicine in children.  Special care may be needed. Overdosage: If you think you have taken too much of this medicine contact a poison control center or emergency room at once. NOTE: This medicine is only for you. Do not share this  medicine with others. What if I miss a dose? It is important not to miss your dose. Call your doctor or health care professional if you are unable to keep an appointment. What may interact with this medicine? Interactions are not expected. This list may not describe all possible interactions. Give your health care provider a list of all the medicines, herbs, non-prescription drugs, or dietary supplements you use. Also tell them if you smoke, drink alcohol, or use illegal drugs. Some items may interact with your medicine. What should I watch for while using this medicine? Your condition will be monitored carefully while you are receiving this medicine. You will need important blood work and urine testing done while you are taking this medicine. During your treatment, let your health care professional know if you have any unusual symptoms, such as difficulty breathing. This medicine may rarely cause 'gastrointestinal perforation' (holes in the stomach, intestines or colon), a serious side effect requiring surgery to repair. This medicine should be started at least 28 days following major surgery and the site of the surgery should be totally healed. Check with your doctor before scheduling dental work or surgery while you are receiving this treatment. Talk to your doctor if you have recently had surgery or if you have a wound that has not healed. Do not become pregnant while taking this medicine or for 6 months after stopping it. Women should inform their doctor if they wish to become pregnant or think they might be pregnant. There is a potential for serious side effects to an unborn child. Talk to your health care professional or pharmacist for more information. Do not breast-feed an infant while taking this medicine. This medicine has caused ovarian failure in some women. This medicine may interfere with the ability to have a child. You should talk to your doctor or health care professional if you are  concerned about your fertility. What side effects may I notice from receiving this medicine? Side effects that you should report to your doctor or health care professional as soon as possible: -allergic reactions like skin rash, itching or hives, swelling of the face, lips, or tongue -signs of infection - fever or chills, cough, sore throat, pain or trouble passing urine -signs of decreased platelets or bleeding - bruising, pinpoint red spots on the skin, black, tarry stools, nosebleeds, blood in the urine -breathing problems -changes in vision -chest pain -confusion -jaw pain, especially after dental work -mouth sores -seizures -severe abdominal pain -severe headache -sudden numbness or weakness of the face, arm or leg -swelling of legs or ankles -symptoms of a stroke: change in mental awareness, inability to talk or move one side of the body (especially in patients with lung cancer) -trouble passing urine or change in the amount of urine -trouble speaking or understanding -trouble walking, dizziness, loss of balance or coordination Side effects that usually do not require medical attention (report to your doctor or health care professional if they continue or are bothersome): -constipation -diarrhea -dry skin -headache -loss of appetite -nausea, vomiting This list may not describe all possible side effects. Call your doctor for medical advice about side effects. You may report side effects to FDA at 1-800-FDA-1088. Where should I keep my medicine? This drug is given in a hospital  or clinic and will not be stored at home. NOTE: This sheet is a summary. It may not cover all possible information. If you have questions about this medicine, talk to your doctor, pharmacist, or health care provider.    2016, Elsevier/Gold Standard. (2014-07-25 16:58:44)

## 2015-06-06 NOTE — Patient Instructions (Signed)

## 2015-06-06 NOTE — Progress Notes (Signed)
  Radiation Oncology         [phone] ________________________________  Name: Brianna Vasquez MRN: [id number]  Date: 06/06/2015  DOB: 05/14/1961  SIMULATION AND TREATMENT PLANNING NOTE  Outpatient  DIAGNOSIS:     ICD-9-CM ICD-10-CM   1. Bone metastasis (HCC) 198.5 C79.51   2. Bone metastases (HCC) 198.5 C79.51     NARRATIVE:  The patient was brought to the [facility].  Identity was confirmed.  All relevant records and images related to the planned course of therapy were reviewed.  The patient freely provided informed written consent to proceed with treatment after reviewing the details related to the planned course of therapy. The consent form was witnessed and verified by the simulation staff.    Then, the patient was set-up in a stable reproducible  supine position for radiation therapy.  CT images were obtained.  Surface markings were placed.  The CT images were loaded into the planning software.    TREATMENT PLANNING NOTE: Treatment planning then occurred.  The radiation prescription was entered and confirmed.    A total of 2 medically necessary complex treatment devices were fabricated and supervised by me, in the form of 2 fields with MLCs to block bladder and bowel. MORE FIELDS WITH MLCs MAY BE ADDED IN DOSIMETRY for dose homogeneity.  I have requested : Isodose Plan.   The patient will receive 8 Gy in 1 fraction to left hip with AP PA fields.   -----------------------------------  Eppie Gibson, MD

## 2015-06-07 ENCOUNTER — Ambulatory Visit
Admission: RE | Admit: 2015-06-07 | Discharge: 2015-06-07 | Disposition: A | Discharge status: Home / Self Care | Payer: BLUE CROSS/BLUE SHIELD | Source: Ambulatory Visit | Attending: Radiation Oncology | Admitting: Radiation Oncology

## 2015-06-07 ENCOUNTER — Encounter: Payer: Self-pay | Admitting: Radiation Oncology

## 2015-06-07 ENCOUNTER — Telehealth: Payer: Self-pay | Admitting: Oncology

## 2015-06-07 VITALS — BP 105/49 | HR 92 | Temp 98.4°F | Ht 65.0 in | Wt 149.3 lb

## 2015-06-07 DIAGNOSIS — C7951 Secondary malignant neoplasm of bone: Secondary | ICD-10-CM

## 2015-06-07 NOTE — Progress Notes (Signed)
  Radiation Oncology         [phone]   Name: Brianna Vasquez MRN: [id number]   Date: 06/07/2015  DOB: 23-Sep-1960     Radiation Therapy Management    ICD-9-CM ICD-10-CM   1. Bone metastases (HCC) 198.5 C79.51     Current Dose: 8 Gy  Planned Dose:  8 Gy  Narrative The patient presents for routine under treatment assessment.  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 She is taking dilaudid for pain and feels like it has helped her since it was changed from oxycodone yesterday, and now rates her pain a 3/10 in her Left hip. Yesterday the pain was a 6/10. She has no other concerns at this time.   The patient is without complaint. Set-up films were reviewed. The chart was checked.  Physical Findings  height is 5\' 5"  (1.651 m) and weight is 149 lb 4.8 oz (67.722 kg). Her temperature is 98.4 F (36.9 C). Her blood pressure is 105/49 and her pulse is 92. . Weight essentially stable.  No significant changes.  Impression The patient is tolerating radiation.  Plan The patient received her only fraction of radiation therapy today. We will follow up in one month.         Sheral Apley Tammi Klippel, M.D.  This document serves as a record of services personally performed by Tyler Pita, MD. It was created on his behalf by Arlyce Harman, a trained medical scribe. The creation of this record is based on the scribe's personal observations and the provider's statements to them. This document has been checked and approved by the attending provider.

## 2015-06-07 NOTE — Progress Notes (Signed)
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 She is taking dilaudid for pain and feels like it has helped her since it was changed from oxycodone yesterday, and now rates her pain a 3/10 in her Left hip. Yesterday the pain was a 6/10. She has no other concerns at this time.    BP 105/49 mmHg  Pulse 92  Temp(Src) 98.4 F (36.9 C)  Ht 5\' 5"  (1.651 m)  Wt 149 lb 4.8 oz (67.722 kg)  BMI 24.84 kg/m2   Wt Readings from Last 3 Encounters:  06/07/15 149 lb 4.8 oz (67.722 kg)  06/06/15 150 lb 1.6 oz (68.085 kg)  06/06/15 149 lb 3.2 oz (67.677 kg)

## 2015-06-07 NOTE — Telephone Encounter (Signed)
S/w pt, gave appt for 06/13/15.

## 2015-06-08 ENCOUNTER — Other Ambulatory Visit: Payer: Self-pay | Admitting: *Deleted

## 2015-06-08 ENCOUNTER — Ambulatory Visit: Payer: BLUE CROSS/BLUE SHIELD

## 2015-06-08 DIAGNOSIS — C562 Malignant neoplasm of left ovary: Secondary | ICD-10-CM

## 2015-06-11 ENCOUNTER — Other Ambulatory Visit: Payer: Self-pay | Admitting: Oncology

## 2015-06-12 ENCOUNTER — Ambulatory Visit: Payer: BLUE CROSS/BLUE SHIELD

## 2015-06-13 ENCOUNTER — Other Ambulatory Visit: Payer: BLUE CROSS/BLUE SHIELD

## 2015-06-13 ENCOUNTER — Other Ambulatory Visit: Payer: Self-pay | Admitting: *Deleted

## 2015-06-13 ENCOUNTER — Ambulatory Visit: Payer: BLUE CROSS/BLUE SHIELD

## 2015-06-13 ENCOUNTER — Ambulatory Visit (HOSPITAL_BASED_OUTPATIENT_CLINIC_OR_DEPARTMENT_OTHER): Payer: BLUE CROSS/BLUE SHIELD

## 2015-06-13 ENCOUNTER — Other Ambulatory Visit (HOSPITAL_BASED_OUTPATIENT_CLINIC_OR_DEPARTMENT_OTHER): Payer: BLUE CROSS/BLUE SHIELD

## 2015-06-13 VITALS — BP 104/51 | HR 79 | Temp 98.3°F | Resp 18 | Ht 65.0 in

## 2015-06-13 DIAGNOSIS — Z5112 Encounter for antineoplastic immunotherapy: Secondary | ICD-10-CM

## 2015-06-13 DIAGNOSIS — C562 Malignant neoplasm of left ovary: Secondary | ICD-10-CM | POA: Diagnosis not present

## 2015-06-13 DIAGNOSIS — C7800 Secondary malignant neoplasm of unspecified lung: Secondary | ICD-10-CM

## 2015-06-13 DIAGNOSIS — Z5111 Encounter for antineoplastic chemotherapy: Secondary | ICD-10-CM | POA: Diagnosis not present

## 2015-06-13 DIAGNOSIS — C801 Malignant (primary) neoplasm, unspecified: Secondary | ICD-10-CM

## 2015-06-13 DIAGNOSIS — C787 Secondary malignant neoplasm of liver and intrahepatic bile duct: Secondary | ICD-10-CM

## 2015-06-13 DIAGNOSIS — C5702 Malignant neoplasm of left fallopian tube: Secondary | ICD-10-CM | POA: Diagnosis not present

## 2015-06-13 DIAGNOSIS — Z95828 Presence of other vascular implants and grafts: Secondary | ICD-10-CM

## 2015-06-13 DIAGNOSIS — C7951 Secondary malignant neoplasm of bone: Secondary | ICD-10-CM

## 2015-06-13 LAB — COMPREHENSIVE METABOLIC PANEL
ALT: 12 U/L (ref 0–55)
AST: 34 U/L (ref 5–34)
Albumin: 2.7 g/dL — ABNORMAL LOW (ref 3.5–5.0)
Alkaline Phosphatase: 312 U/L — ABNORMAL HIGH (ref 40–150)
Anion Gap: 9 mEq/L (ref 3–11)
BUN: 6.4 mg/dL — AB (ref 7.0–26.0)
CHLORIDE: 103 meq/L (ref 98–109)
CO2: 26 meq/L (ref 22–29)
Calcium: 9.3 mg/dL (ref 8.4–10.4)
Creatinine: 0.8 mg/dL (ref 0.6–1.1)
EGFR: >90 mL/min/{1.73_m2} (ref >90)
Glucose: 101 mg/dl (ref 70–140)
POTASSIUM: 3.7 meq/L (ref 3.5–5.1)
SODIUM: 138 meq/L (ref 136–145)
Total Bilirubin: 1.49 mg/dL — ABNORMAL HIGH (ref 0.20–1.20)
Total Protein: 7 g/dL (ref 6.4–8.3)

## 2015-06-13 LAB — CBC WITH DIFFERENTIAL/PLATELET
BASO%: 0.3 % (ref 0.0–2.0)
BASOS ABS: 0 10*3/uL (ref 0.0–0.1)
EOS ABS: 0.5 10*3/uL (ref 0.0–0.5)
EOS%: 4.8 % (ref 0.0–7.0)
HCT: 31 % — ABNORMAL LOW (ref 34.8–46.6)
HGB: 9.9 g/dL — ABNORMAL LOW (ref 11.6–15.9)
LYMPH%: 16.1 % (ref 14.0–49.7)
MCH: 31.8 pg (ref 25.1–34.0)
MCHC: 31.9 g/dL (ref 31.5–36.0)
MCV: 99.7 fL (ref 79.5–101.0)
MONO#: 1.1 10*3/uL — AB (ref 0.1–0.9)
MONO%: 11 % (ref 0.0–14.0)
NEUT%: 67.8 % (ref 38.4–76.8)
NEUTROS ABS: 6.9 10*3/uL — AB (ref 1.5–6.5)
PLATELETS: 359 10*3/uL (ref 145–400)
RBC: 3.11 10*6/uL — AB (ref 3.70–5.45)
RDW: 17.6 % — ABNORMAL HIGH (ref 11.2–14.5)
WBC: 10.2 10*3/uL (ref 3.9–10.3)
lymph#: 1.6 10*3/uL (ref 0.9–3.3)

## 2015-06-13 MED ORDER — PALONOSETRON HCL INJECTION 0.25 MG/5ML
0.2500 mg | Freq: Once | INTRAVENOUS | Status: AC
  Administered 2015-06-13: 0.25 mg via INTRAVENOUS

## 2015-06-13 MED ORDER — IRINOTECAN HCL CHEMO INJECTION 100 MG/5ML
135.0000 mg/m2 | Freq: Once | INTRAVENOUS | Status: AC
  Administered 2015-06-13: 238 mg via INTRAVENOUS
  Filled 2015-06-13: qty 11.9

## 2015-06-13 MED ORDER — SODIUM CHLORIDE 0.9 % IV SOLN: Freq: Once | INTRAVENOUS | Status: DC

## 2015-06-13 MED ORDER — SODIUM CHLORIDE 0.9 % IV SOLN
5.0000 mg/kg | Freq: Once | INTRAVENOUS | Status: AC
  Administered 2015-06-13: 350 mg via INTRAVENOUS
  Filled 2015-06-13: qty 14

## 2015-06-13 MED ORDER — SODIUM CHLORIDE 0.9 % IV SOLN
Freq: Once | INTRAVENOUS | Status: AC
  Administered 2015-06-13: 13:00:00 via INTRAVENOUS
  Filled 2015-06-13: qty 5

## 2015-06-13 MED ORDER — SODIUM CHLORIDE 0.9 % IJ SOLN
10.0000 mL | INTRAMUSCULAR | Status: DC | PRN
  Administered 2015-06-13: 10 mL via INTRAVENOUS
  Filled 2015-06-13: qty 10

## 2015-06-13 MED ORDER — FLUOROURACIL CHEMO INJECTION 5 GM/100ML
1600.0000 mg/m2 | INTRAVENOUS | Status: DC
  Administered 2015-06-13: 2850 mg via INTRAVENOUS
  Filled 2015-06-13: qty 57

## 2015-06-13 MED ORDER — PALONOSETRON HCL INJECTION 0.25 MG/5ML
INTRAVENOUS | Status: AC
  Filled 2015-06-13: qty 5

## 2015-06-13 MED ORDER — MORPHINE SULFATE ER 30 MG PO TBCR: 30.0000 mg | EXTENDED_RELEASE_TABLET | Freq: Two times a day (BID) | ORAL | Status: DC

## 2015-06-13 MED ORDER — ATROPINE SULFATE 1 MG/ML IJ SOLN
INTRAMUSCULAR | Status: AC
  Filled 2015-06-13: qty 1

## 2015-06-13 MED ORDER — SODIUM CHLORIDE 0.9 % IV SOLN
Freq: Once | INTRAVENOUS | Status: AC
  Administered 2015-06-13: 11:00:00 via INTRAVENOUS

## 2015-06-13 MED ORDER — LEUCOVORIN CALCIUM INJECTION 100 MG
20.0000 mg/m2 | Freq: Once | INTRAMUSCULAR | Status: AC
  Administered 2015-06-13: 36 mg via INTRAVENOUS
  Filled 2015-06-13: qty 1.8

## 2015-06-13 MED ORDER — HYDROMORPHONE HCL 4 MG PO TABS: ORAL_TABLET | ORAL | Status: DC

## 2015-06-13 MED ORDER — ATROPINE SULFATE 1 MG/ML IJ SOLN
0.5000 mg | Freq: Once | INTRAMUSCULAR | Status: AC | PRN
Start: 2015-06-13 — End: 2015-06-13
  Administered 2015-06-13: 0.5 mg via INTRAVENOUS

## 2015-06-13 NOTE — Patient Instructions (Signed)

## 2015-06-13 NOTE — Progress Notes (Signed)
Call from [facility] in [place] to Charge RN, pt has been authorized for treatment today.

## 2015-06-13 NOTE — Patient Instructions (Addendum)
[facility] Discharge Instructions for Patients Receiving Chemotherapy  Today you received the following chemotherapy agents Avastin, Irinotecan, 5FU, Leukovorin  To help prevent nausea and vomiting after your treatment, we encourage you to take your nausea medication as directed   If you develop nausea and vomiting that is not controlled by your nausea medication, call the clinic.   BELOW ARE SYMPTOMS THAT SHOULD BE REPORTED IMMEDIATELY:  *FEVER GREATER THAN 100.5 F  *CHILLS WITH OR WITHOUT FEVER  NAUSEA AND VOMITING THAT IS NOT CONTROLLED WITH YOUR NAUSEA MEDICATION  *UNUSUAL SHORTNESS OF BREATH  *UNUSUAL BRUISING OR BLEEDING  TENDERNESS IN MOUTH AND THROAT WITH OR WITHOUT PRESENCE OF ULCERS  *URINARY PROBLEMS  *BOWEL PROBLEMS  UNUSUAL RASH Items with * indicate a potential emergency and should be followed up as soon as possible.  Feel free to call the clinic you have any questions or concerns. The clinic phone number is [phone].  Please show the [facility] at check-in to the Emergency Department and triage nurse.  Bevacizumab injection What is this medicine? BEVACIZUMAB (be va SIZ yoo mab) is a monoclonal antibody. It is used to treat cervical cancer, colorectal cancer, glioblastoma multiforme, non-small cell lung cancer (NSCLC), ovarian cancer, and renal cell cancer. This medicine may be used for other purposes; ask your health care provider or pharmacist if you have questions. What should I tell my health care provider before I take this medicine? They need to know if you have any of these conditions: -blood clots -heart disease, including heart failure, heart attack, or chest pain (angina) -high blood pressure -infection (especially a virus infection such as chickenpox, cold sores, or herpes) -kidney disease -lung disease -prior chemotherapy with doxorubicin, daunorubicin, epirubicin, or other anthracycline type chemotherapy  agents -recent or ongoing radiation therapy -recent surgery -stroke -an unusual or allergic reaction to bevacizumab, hamster proteins, mouse proteins, other medicines, foods, dyes, or preservatives -pregnant or trying to get pregnant -breast-feeding How should I use this medicine? This medicine is for infusion into a vein. It is given by a health care professional in a hospital or clinic setting. Talk to your pediatrician regarding the use of this medicine in children. Special care may be needed. Overdosage: If you think you have taken too much of this medicine contact a poison control center or emergency room at once. NOTE: This medicine is only for you. Do not share this medicine with others. What if I miss a dose? It is important not to miss your dose. Call your doctor or health care professional if you are unable to keep an appointment. What may interact with this medicine? Interactions are not expected. This list may not describe all possible interactions. Give your health care provider a list of all the medicines, herbs, non-prescription drugs, or dietary supplements you use. Also tell them if you smoke, drink alcohol, or use illegal drugs. Some items may interact with your medicine. What should I watch for while using this medicine? Your condition will be monitored carefully while you are receiving this medicine. You will need important blood work and urine testing done while you are taking this medicine. During your treatment, let your health care professional know if you have any unusual symptoms, such as difficulty breathing. This medicine may rarely cause 'gastrointestinal perforation' (holes in the stomach, intestines or colon), a serious side effect requiring surgery to repair. This medicine should be started at least 28 days following major surgery and the site of the surgery should  be totally healed. Check with your doctor before scheduling dental work or surgery while you are  receiving this treatment. Talk to your doctor if you have recently had surgery or if you have a wound that has not healed. Do not become pregnant while taking this medicine or for 6 months after stopping it. Women should inform their doctor if they wish to become pregnant or think they might be pregnant. There is a potential for serious side effects to an unborn child. Talk to your health care professional or pharmacist for more information. Do not breast-feed an infant while taking this medicine. This medicine has caused ovarian failure in some women. This medicine may interfere with the ability to have a child. You should talk to your doctor or health care professional if you are concerned about your fertility. What side effects may I notice from receiving this medicine? Side effects that you should report to your doctor or health care professional as soon as possible: -allergic reactions like skin rash, itching or hives, swelling of the face, lips, or tongue -signs of infection - fever or chills, cough, sore throat, pain or trouble passing urine -signs of decreased platelets or bleeding - bruising, pinpoint red spots on the skin, black, tarry stools, nosebleeds, blood in the urine -breathing problems -changes in vision -chest pain -confusion -jaw pain, especially after dental work -mouth sores -seizures -severe abdominal pain -severe headache -sudden numbness or weakness of the face, arm or leg -swelling of legs or ankles -symptoms of a stroke: change in mental awareness, inability to talk or move one side of the body (especially in patients with lung cancer) -trouble passing urine or change in the amount of urine -trouble speaking or understanding -trouble walking, dizziness, loss of balance or coordination Side effects that usually do not require medical attention (report to your doctor or health care professional if they continue or are bothersome): -constipation -diarrhea -dry  skin -headache -loss of appetite -nausea, vomiting This list may not describe all possible side effects. Call your doctor for medical advice about side effects. You may report side effects to FDA at 1-800-FDA-1088. Where should I keep my medicine? This drug is given in a hospital or clinic and will not be stored at home. NOTE: This sheet is a summary. It may not cover all possible information. If you have questions about this medicine, talk to your doctor, pharmacist, or health care provider.    2016, Elsevier/Gold Standard. (2014-07-25 16:58:44)  Irinotecan injection What is this medicine? IRINOTECAN (ir in oh TEE kan ) is a chemotherapy drug. It is used to treat colon and rectal cancer. This medicine may be used for other purposes; ask your health care provider or pharmacist if you have questions. What should I tell my health care provider before I take this medicine? They need to know if you have any of these conditions: -blood disorders -dehydration -diarrhea -infection (especially a virus infection such as chickenpox, cold sores, or herpes) -liver disease -low blood counts, like low white cell, platelet, or red cell counts -recent or ongoing radiation therapy -an unusual or allergic reaction to irinotecan, sorbitol, other chemotherapy, other medicines, foods, dyes, or preservatives -pregnant or trying to get pregnant -breast-feeding How should I use this medicine? This drug is given as an infusion into a vein. It is administered in a hospital or clinic by a specially trained health care professional. Talk to your pediatrician regarding the use of this medicine in children. Special care may be needed. Overdosage:  If you think you have taken too much of this medicine contact a poison control center or emergency room at once. NOTE: This medicine is only for you. Do not share this medicine with others. What if I miss a dose? It is important not to miss your dose. Call your doctor  or health care professional if you are unable to keep an appointment. What may interact with this medicine? Do not take this medicine with any of the following medications: -atazanavir -certain medicines for fungal infections like itraconazole and ketoconazole -St. John's Wort This medicine may also interact with the following medications: -dexamethasone -diuretics -laxatives -medicines for seizures like carbamazepine, mephobarbital, phenobarbital, phenytoin, primidone -medicines to increase blood counts like filgrastim, pegfilgrastim, sargramostim -prochlorperazine -vaccines This list may not describe all possible interactions. Give your health care provider a list of all the medicines, herbs, non-prescription drugs, or dietary supplements you use. Also tell them if you smoke, drink alcohol, or use illegal drugs. Some items may interact with your medicine. What should I watch for while using this medicine? Your condition will be monitored carefully while you are receiving this medicine. You will need important blood work done while you are taking this medicine. This drug may make you feel generally unwell. This is not uncommon, as chemotherapy can affect healthy cells as well as cancer cells. Report any side effects. Continue your course of treatment even though you feel ill unless your doctor tells you to stop. In some cases, you may be given additional medicines to help with side effects. Follow all directions for their use. You may get drowsy or dizzy. Do not drive, use machinery, or do anything that needs mental alertness until you know how this medicine affects you. Do not stand or sit up quickly, especially if you are an older patient. This reduces the risk of dizzy or fainting spells. Call your doctor or health care professional for advice if you get a fever, chills or sore throat, or other symptoms of a cold or flu. Do not treat yourself. This drug decreases your body's ability to fight  infections. Try to avoid being around people who are sick. This medicine may increase your risk to bruise or bleed. Call your doctor or health care professional if you notice any unusual bleeding. Be careful brushing and flossing your teeth or using a toothpick because you may get an infection or bleed more easily. If you have any dental work done, tell your dentist you are receiving this medicine. Avoid taking products that contain aspirin, acetaminophen, ibuprofen, naproxen, or ketoprofen unless instructed by your doctor. These medicines may hide a fever. Do not become pregnant while taking this medicine. Women should inform their doctor if they wish to become pregnant or think they might be pregnant. There is a potential for serious side effects to an unborn child. Talk to your health care professional or pharmacist for more information. Do not breast-feed an infant while taking this medicine. What side effects may I notice from receiving this medicine? Side effects that you should report to your doctor or health care professional as soon as possible: -allergic reactions like skin rash, itching or hives, swelling of the face, lips, or tongue -low blood counts - this medicine may decrease the number of white blood cells, red blood cells and platelets. You may be at increased risk for infections and bleeding. -signs of infection - fever or chills, cough, sore throat, pain or difficulty passing urine -signs of decreased platelets or bleeding -  bruising, pinpoint red spots on the skin, black, tarry stools, blood in the urine -signs of decreased red blood cells - unusually weak or tired, fainting spells, lightheadedness -breathing problems -chest pain -diarrhea -feeling faint or lightheaded, falls -flushing, runny nose, sweating during infusion -mouth sores or pain -pain, swelling, redness or irritation where injected -pain, swelling, warmth in the leg -pain, tingling, numbness in the hands or  feet -problems with balance, talking, walking -stomach cramps, pain -trouble passing urine or change in the amount of urine -vomiting as to be unable to hold down drinks or food -yellowing of the eyes or skin Side effects that usually do not require medical attention (report to your doctor or health care professional if they continue or are bothersome): -constipation -hair loss -headache -loss of appetite -nausea, vomiting -stomach upset This list may not describe all possible side effects. Call your doctor for medical advice about side effects. You may report side effects to FDA at 1-800-FDA-1088. Where should I keep my medicine? This drug is given in a hospital or clinic and will not be stored at home. NOTE: This sheet is a summary. It may not cover all possible information. If you have questions about this medicine, talk to your doctor, pharmacist, or health care provider.    2016, Elsevier/Gold Standard. (2012-11-22 16:29:32)

## 2015-06-13 NOTE — Progress Notes (Signed)
Dr. Benay Spice notified about pain and lump in breast.  Pain medication changed and refilled.  Dr. Benay Spice will do exam at next visit.  Also will not do urine protein with this first tx per Dr. Benay Spice -( patient has stent.) Dr. Benay Spice here to see patient. 1440.  Patient experiencing some flushing.   Camptosar stopped and atropine given @1442 .     1505 - patient feeling much better.  Camptosar restarted.

## 2015-06-14 ENCOUNTER — Encounter: Payer: Self-pay | Admitting: Oncology

## 2015-06-14 ENCOUNTER — Telehealth: Payer: Self-pay | Admitting: *Deleted

## 2015-06-14 ENCOUNTER — Ambulatory Visit: Payer: BLUE CROSS/BLUE SHIELD

## 2015-06-14 NOTE — Telephone Encounter (Signed)
Left message for pt to call office to discuss side effect management.

## 2015-06-14 NOTE — Progress Notes (Signed)
A Prior Authorization is not applicable for this patient/medication. No further PA action needed.  Per [facility]. I called [facility] to let them know  [phone] no auth needed.

## 2015-06-14 NOTE — Progress Notes (Signed)
Per tonya -[facility]-case#[id number]  Gone to review for morphine. I will let nurse know.

## 2015-06-14 NOTE — Progress Notes (Signed)
I let the patient know waiting on prior auth for morpine and will call her bck once approved so she can go get and I will let [facility] know also.

## 2015-06-15 ENCOUNTER — Ambulatory Visit: Payer: BLUE CROSS/BLUE SHIELD

## 2015-06-15 ENCOUNTER — Ambulatory Visit (HOSPITAL_BASED_OUTPATIENT_CLINIC_OR_DEPARTMENT_OTHER): Payer: BLUE CROSS/BLUE SHIELD

## 2015-06-15 DIAGNOSIS — C562 Malignant neoplasm of left ovary: Secondary | ICD-10-CM | POA: Diagnosis not present

## 2015-06-15 DIAGNOSIS — C7800 Secondary malignant neoplasm of unspecified lung: Secondary | ICD-10-CM | POA: Diagnosis not present

## 2015-06-15 DIAGNOSIS — C5702 Malignant neoplasm of left fallopian tube: Secondary | ICD-10-CM

## 2015-06-15 DIAGNOSIS — C7951 Secondary malignant neoplasm of bone: Secondary | ICD-10-CM

## 2015-06-15 DIAGNOSIS — Z5189 Encounter for other specified aftercare: Secondary | ICD-10-CM

## 2015-06-15 DIAGNOSIS — C787 Secondary malignant neoplasm of liver and intrahepatic bile duct: Secondary | ICD-10-CM | POA: Diagnosis not present

## 2015-06-15 DIAGNOSIS — C801 Malignant (primary) neoplasm, unspecified: Secondary | ICD-10-CM

## 2015-06-15 MED ORDER — PEGFILGRASTIM INJECTION 6 MG/0.6ML ~~LOC~~
6.0000 mg | PREFILLED_SYRINGE | Freq: Once | SUBCUTANEOUS | Status: AC
  Administered 2015-06-15: 6 mg via SUBCUTANEOUS
  Filled 2015-06-15: qty 0.6

## 2015-06-15 MED ORDER — HEPARIN SOD (PORK) LOCK FLUSH 100 UNIT/ML IV SOLN
500.0000 [IU] | Freq: Once | INTRAVENOUS | Status: AC | PRN
  Administered 2015-06-15: 500 [IU]
  Filled 2015-06-15: qty 5

## 2015-06-15 MED ORDER — SODIUM CHLORIDE 0.9 % IJ SOLN
10.0000 mL | INTRAMUSCULAR | Status: DC | PRN
  Administered 2015-06-15: 10 mL
  Filled 2015-06-15: qty 10

## 2015-06-15 NOTE — Progress Notes (Signed)
Neulasta injection given by flush nurse 

## 2015-06-18 ENCOUNTER — Ambulatory Visit: Payer: BLUE CROSS/BLUE SHIELD

## 2015-06-19 ENCOUNTER — Ambulatory Visit: Payer: BLUE CROSS/BLUE SHIELD

## 2015-06-20 ENCOUNTER — Ambulatory Visit: Payer: BLUE CROSS/BLUE SHIELD

## 2015-06-20 MED ORDER — ONDANSETRON HCL 8 MG PO TABS
ORAL_TABLET | ORAL | Status: AC
  Filled 2015-06-20: qty 1

## 2015-06-21 ENCOUNTER — Telehealth: Payer: Self-pay | Admitting: *Deleted

## 2015-06-21 ENCOUNTER — Ambulatory Visit: Payer: BLUE CROSS/BLUE SHIELD

## 2015-06-21 ENCOUNTER — Other Ambulatory Visit: Payer: Self-pay | Admitting: *Deleted

## 2015-06-21 DIAGNOSIS — G8918 Other acute postprocedural pain: Secondary | ICD-10-CM

## 2015-06-21 DIAGNOSIS — C801 Malignant (primary) neoplasm, unspecified: Secondary | ICD-10-CM

## 2015-06-21 MED ORDER — OXYCODONE HCL 5 MG PO TABS: ORAL_TABLET | ORAL | Status: DC

## 2015-06-21 NOTE — Telephone Encounter (Signed)
Per Dr. Benay Spice; notified pt that re-fill request for Oxy IR is ready for p/u.  Pt verbalized understanding and expressed appreciation for call.

## 2015-06-21 NOTE — Telephone Encounter (Signed)
Pt called and left message re:  Pt has now been taking Dilaudid 2 tabs every 4 hours with adequate pain control.  However, pt has episodes of incoherent, dizziness, and not able to function since taking Dilaudid.  Pt would like to change back to Oxycodone during day , and Dilaudid during night for pain. Pt stated she has no Oxycodone pills left.   Message sent to Dr. Benay Spice and desk nurse. Pt's  Phone   [phone]

## 2015-06-22 ENCOUNTER — Ambulatory Visit: Payer: BLUE CROSS/BLUE SHIELD

## 2015-06-22 MED ORDER — ONDANSETRON HCL 8 MG PO TABS
ORAL_TABLET | ORAL | Status: AC
  Filled 2015-06-22: qty 1

## 2015-06-24 ENCOUNTER — Other Ambulatory Visit: Payer: Self-pay | Admitting: Oncology

## 2015-06-25 ENCOUNTER — Ambulatory Visit: Payer: BLUE CROSS/BLUE SHIELD

## 2015-06-26 ENCOUNTER — Ambulatory Visit: Payer: BLUE CROSS/BLUE SHIELD

## 2015-06-26 MED ORDER — ONDANSETRON HCL 8 MG PO TABS
ORAL_TABLET | ORAL | Status: AC
  Filled 2015-06-26: qty 1

## 2015-06-27 ENCOUNTER — Ambulatory Visit: Payer: Self-pay

## 2015-06-27 ENCOUNTER — Ambulatory Visit (HOSPITAL_BASED_OUTPATIENT_CLINIC_OR_DEPARTMENT_OTHER): Payer: Self-pay | Admitting: Oncology

## 2015-06-27 ENCOUNTER — Other Ambulatory Visit (HOSPITAL_BASED_OUTPATIENT_CLINIC_OR_DEPARTMENT_OTHER): Payer: Self-pay

## 2015-06-27 ENCOUNTER — Telehealth: Payer: Self-pay | Admitting: Oncology

## 2015-06-27 ENCOUNTER — Ambulatory Visit: Payer: BLUE CROSS/BLUE SHIELD

## 2015-06-27 VITALS — BP 117/69 | HR 117 | Temp 98.2°F | Resp 18 | Ht 65.0 in | Wt 135.4 lb

## 2015-06-27 DIAGNOSIS — K639 Disease of intestine, unspecified: Secondary | ICD-10-CM

## 2015-06-27 DIAGNOSIS — C562 Malignant neoplasm of left ovary: Secondary | ICD-10-CM

## 2015-06-27 DIAGNOSIS — C7800 Secondary malignant neoplasm of unspecified lung: Secondary | ICD-10-CM

## 2015-06-27 DIAGNOSIS — C787 Secondary malignant neoplasm of liver and intrahepatic bile duct: Secondary | ICD-10-CM

## 2015-06-27 DIAGNOSIS — C5702 Malignant neoplasm of left fallopian tube: Secondary | ICD-10-CM

## 2015-06-27 DIAGNOSIS — C7951 Secondary malignant neoplasm of bone: Secondary | ICD-10-CM

## 2015-06-27 LAB — COMPREHENSIVE METABOLIC PANEL
ALBUMIN: 3 g/dL — AB (ref 3.5–5.0)
ALK PHOS: 353 U/L — AB (ref 40–150)
ALT: 17 U/L (ref 0–55)
AST: 32 U/L (ref 5–34)
Anion Gap: 9 mEq/L (ref 3–11)
BILIRUBIN TOTAL: 0.64 mg/dL (ref 0.20–1.20)
BUN: 7.9 mg/dL (ref 7.0–26.0)
CALCIUM: 9.5 mg/dL (ref 8.4–10.4)
CO2: 26 mEq/L (ref 22–29)
CREATININE: 0.9 mg/dL (ref 0.6–1.1)
Chloride: 105 mEq/L (ref 98–109)
EGFR: 78 mL/min/{1.73_m2} — ABNORMAL LOW (ref >90)
GLUCOSE: 120 mg/dL (ref 70–140)
Potassium: 3.8 mEq/L (ref 3.5–5.1)
Sodium: 140 mEq/L (ref 136–145)
TOTAL PROTEIN: 7.5 g/dL (ref 6.4–8.3)

## 2015-06-27 LAB — CBC WITH DIFFERENTIAL/PLATELET
BASO%: 1.3 % (ref 0.0–2.0)
BASOS ABS: 0.1 10*3/uL (ref 0.0–0.1)
EOS%: 3.3 % (ref 0.0–7.0)
Eosinophils Absolute: 0.3 10*3/uL (ref 0.0–0.5)
HEMATOCRIT: 35 % (ref 34.8–46.6)
HEMOGLOBIN: 11.2 g/dL — AB (ref 11.6–15.9)
LYMPH#: 1.3 10*3/uL (ref 0.9–3.3)
LYMPH%: 15.7 % (ref 14.0–49.7)
MCH: 31.5 pg (ref 25.1–34.0)
MCHC: 32.1 g/dL (ref 31.5–36.0)
MCV: 98.1 fL (ref 79.5–101.0)
MONO#: 1.2 10*3/uL — ABNORMAL HIGH (ref 0.1–0.9)
MONO%: 14.5 % — ABNORMAL HIGH (ref 0.0–14.0)
NEUT%: 65.2 % (ref 38.4–76.8)
NEUTROS ABS: 5.6 10*3/uL (ref 1.5–6.5)
Platelets: 328 10*3/uL (ref 145–400)
RBC: 3.57 10*6/uL — ABNORMAL LOW (ref 3.70–5.45)
RDW: 17.6 % — AB (ref 11.2–14.5)
WBC: 8.6 10*3/uL (ref 3.9–10.3)

## 2015-06-27 MED ORDER — SODIUM CHLORIDE 0.9 % IJ SOLN
10.0000 mL | INTRAMUSCULAR | Status: DC | PRN
  Filled 2015-06-27: qty 10

## 2015-06-27 NOTE — Progress Notes (Signed)
Pt requests labs peripherally.

## 2015-06-27 NOTE — Telephone Encounter (Signed)
per pof to sch pt papt-gave pt copy of avs-sent MW/Melissa email to sch trmt-pt to get updated copy on 1/20

## 2015-06-27 NOTE — Progress Notes (Signed)
[facility] OFFICE PROGRESS NOTE   Diagnosis: Mucinous adenocarcinoma the ovary  INTERVAL HISTORY:   [name] returns as scheduled. She completed treatment with FOLFIRI/Avastin 06/13/2015. She had nausea following chemotherapy, but no emesis. She complains of anorexia and malaise. This has improved over the past few days. No mouth sores or diarrhea. She continues to have pain at the left hip following palliative radiation. She takes oxycodone every 4 hours during the day and Dilaudid at night. She has not started MS Contin.  She complains of discomfort at the left great toe.  Objective:  Vital signs in last 24 hours:  Blood pressure 117/69, pulse 117, temperature 98.2 F (36.8 C), temperature source Oral, resp. rate 18, height _0  (1.651 m), weight 135 lb 6.4 oz (61.417 kg), SpO2 99 %.    HEENT: No thrush or ulcers Resp: Lungs clear bilaterally Cardio: Regular rate and rhythm GI: No hepatomegaly, nontender Vascular: The left lower leg is slightly larger than the right side Musculoskeletal: Mild erythema at the medial aspect of the left great toe surrounding the nail bed. No purulence.  Portacath/PICC-without erythema  Lab Results:  Lab Results  Component Value Date   WBC 8.6 06/27/2015   HGB 11.2* 06/27/2015   HCT 35.0 06/27/2015   MCV 98.1 06/27/2015   PLT 328 06/27/2015   NEUTROABS 5.6 06/27/2015     Lab Results  Component Value Date   CEA 132.6* 06/06/2015    Medications: I have reviewed the patient's current medications.  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 Metastatic  left periaortic adenopathy.  PET scan 02/06/2015 with extensive hypermetabolic lymphadenopathy in the chest, abdomen, and pelvis, Residual left pelvic tumor, sigmoid colon lesion, liver/lung metastases, and a left acetabulum metastasis  Colonoscopy 02/09/2015 with a stricture at 30 cm, biopsy positive for adenocarcinoma, no intrinsic colon mass noted  Cycle 1 FOLFOX 02/14/2015  Cycle 2 FOLFOX 02/28/2015  CT 03/16/2015 with improvement in lung metastases, stable liver lesions and left pelvic soft tissue fullness, right ovarian vein thrombosis  Cycle 3 FOLFOX 03/26/2015 with Neulasta support  Cycle 4 FOLFOX 04/16/2015  Cycle 5 FOLFOX 05/07/2015 (oxaliplatin and 5-FU dose reduced, 5-FU bolus eliminated)  CTs chest, abdomen, and pelvis on 06/05/2015-mixed response of lung metastases, progression of hepatic metastases, decreased soft tissue in the left retroperitoneal space and left psoas, increased mixed lytic/sclerotic metastasis at the left acetabulum  Cycle 1 FOLFIRI/Avastin 06/13/2015  2. Placement of a left ureter stent 01/16/2015  3. Family history of multiple cancers, report of a paternal cousin-BRCA2 positive  4. Upper endoscopy 01/29/2015 with a localized thick gastric fold with a small ulcer  5. Mild swelling of the left foot and ankle 01/27/2015  6. Pain secondary to the left pelvic tumor and acetabulum metastasis-progressive acetabulum metastasis on the CT 06/05/2015  Palliative radiation, 1 fraction, 06/07/2015   7. Acute/delayed nausea following FOLFOX-Aloxi and amend were added with cycle 2, prophylactic Decadron with cycle 3  8. Acute transient loss of bladder control with cycle 1 FOLFOX  9. Severe neutropenia following cycle 2 FOLFOX. Neulasta added beginning with cycle 3.  10. Admission 04/26/2015 with severe mucositis secondary to chemotherapy  11. History of Pancytopenia secondary chemotherapy-red cell transfusion  04/28/2015, platelet  transfusion 04/28/2015   Disposition:  Brianna Vasquez tolerated the FOLFIRI/Avastin well, though she developed increased anorexia and malaise following chemotherapy. She would like to delay the next treatment until 07/03/2015. She will be seen for an office visit that day.  Brianna Vasquez will begin MS Contin for pain.  Betsy Coder, MD  06/27/2015  9:01 AM

## 2015-06-28 ENCOUNTER — Ambulatory Visit: Payer: BLUE CROSS/BLUE SHIELD

## 2015-06-29 ENCOUNTER — Ambulatory Visit: Payer: BLUE CROSS/BLUE SHIELD

## 2015-07-03 ENCOUNTER — Other Ambulatory Visit (HOSPITAL_BASED_OUTPATIENT_CLINIC_OR_DEPARTMENT_OTHER): Payer: Self-pay | Admitting: *Deleted

## 2015-07-03 ENCOUNTER — Ambulatory Visit (HOSPITAL_BASED_OUTPATIENT_CLINIC_OR_DEPARTMENT_OTHER): Payer: Self-pay

## 2015-07-03 ENCOUNTER — Other Ambulatory Visit: Payer: Self-pay | Admitting: *Deleted

## 2015-07-03 ENCOUNTER — Other Ambulatory Visit (HOSPITAL_BASED_OUTPATIENT_CLINIC_OR_DEPARTMENT_OTHER): Payer: Self-pay

## 2015-07-03 ENCOUNTER — Telehealth: Payer: Self-pay | Admitting: *Deleted

## 2015-07-03 ENCOUNTER — Ambulatory Visit (HOSPITAL_BASED_OUTPATIENT_CLINIC_OR_DEPARTMENT_OTHER): Payer: Self-pay | Admitting: Oncology

## 2015-07-03 ENCOUNTER — Telehealth: Payer: Self-pay | Admitting: Nurse Practitioner

## 2015-07-03 ENCOUNTER — Ambulatory Visit: Payer: Self-pay

## 2015-07-03 VITALS — BP 104/66 | HR 108 | Temp 98.2°F | Resp 18 | Ht 65.0 in | Wt 133.5 lb

## 2015-07-03 DIAGNOSIS — C787 Secondary malignant neoplasm of liver and intrahepatic bile duct: Secondary | ICD-10-CM

## 2015-07-03 DIAGNOSIS — C7951 Secondary malignant neoplasm of bone: Secondary | ICD-10-CM

## 2015-07-03 DIAGNOSIS — C7800 Secondary malignant neoplasm of unspecified lung: Secondary | ICD-10-CM

## 2015-07-03 DIAGNOSIS — Z5111 Encounter for antineoplastic chemotherapy: Secondary | ICD-10-CM

## 2015-07-03 DIAGNOSIS — C562 Malignant neoplasm of left ovary: Secondary | ICD-10-CM

## 2015-07-03 DIAGNOSIS — C801 Malignant (primary) neoplasm, unspecified: Secondary | ICD-10-CM

## 2015-07-03 DIAGNOSIS — R630 Anorexia: Secondary | ICD-10-CM

## 2015-07-03 DIAGNOSIS — K639 Disease of intestine, unspecified: Secondary | ICD-10-CM

## 2015-07-03 DIAGNOSIS — C5702 Malignant neoplasm of left fallopian tube: Secondary | ICD-10-CM

## 2015-07-03 DIAGNOSIS — Z5112 Encounter for antineoplastic immunotherapy: Secondary | ICD-10-CM

## 2015-07-03 DIAGNOSIS — G8918 Other acute postprocedural pain: Secondary | ICD-10-CM

## 2015-07-03 DIAGNOSIS — Z95828 Presence of other vascular implants and grafts: Secondary | ICD-10-CM

## 2015-07-03 DIAGNOSIS — R110 Nausea: Secondary | ICD-10-CM

## 2015-07-03 DIAGNOSIS — Z809 Family history of malignant neoplasm, unspecified: Secondary | ICD-10-CM

## 2015-07-03 DIAGNOSIS — M25552 Pain in left hip: Secondary | ICD-10-CM

## 2015-07-03 LAB — CBC WITH DIFFERENTIAL/PLATELET
BASO%: 1.2 % (ref 0.0–2.0)
BASOS ABS: 0.1 10*3/uL (ref 0.0–0.1)
EOS%: 4.4 % (ref 0.0–7.0)
Eosinophils Absolute: 0.5 10*3/uL (ref 0.0–0.5)
HEMATOCRIT: 34.4 % — AB (ref 34.8–46.6)
HGB: 11.3 g/dL — ABNORMAL LOW (ref 11.6–15.9)
LYMPH#: 1.8 10*3/uL (ref 0.9–3.3)
LYMPH%: 15.4 % (ref 14.0–49.7)
MCH: 32.2 pg (ref 25.1–34.0)
MCHC: 33 g/dL (ref 31.5–36.0)
MCV: 97.4 fL (ref 79.5–101.0)
MONO#: 1.5 10*3/uL — ABNORMAL HIGH (ref 0.1–0.9)
MONO%: 12.2 % (ref 0.0–14.0)
NEUT#: 8 10*3/uL — ABNORMAL HIGH (ref 1.5–6.5)
NEUT%: 66.8 % (ref 38.4–76.8)
PLATELETS: 366 10*3/uL (ref 145–400)
RBC: 3.53 10*6/uL — AB (ref 3.70–5.45)
RDW: 17.6 % — ABNORMAL HIGH (ref 11.2–14.5)
WBC: 11.9 10*3/uL — ABNORMAL HIGH (ref 3.9–10.3)

## 2015-07-03 LAB — UA PROTEIN, DIPSTICK - CHCC: PROTEIN: 30 mg/dL

## 2015-07-03 LAB — COMPREHENSIVE METABOLIC PANEL
ALT: 18 U/L (ref 0–55)
ANION GAP: 9 meq/L (ref 3–11)
AST: 37 U/L — ABNORMAL HIGH (ref 5–34)
Albumin: 3.1 g/dL — ABNORMAL LOW (ref 3.5–5.0)
Alkaline Phosphatase: 419 U/L — ABNORMAL HIGH (ref 40–150)
BUN: 10.6 mg/dL (ref 7.0–26.0)
CALCIUM: 9.7 mg/dL (ref 8.4–10.4)
CHLORIDE: 104 meq/L (ref 98–109)
CO2: 25 meq/L (ref 22–29)
Creatinine: 0.8 mg/dL (ref 0.6–1.1)
EGFR: 81 mL/min/{1.73_m2} — ABNORMAL LOW (ref >90)
Glucose: 124 mg/dl (ref 70–140)
POTASSIUM: 3.8 meq/L (ref 3.5–5.1)
Sodium: 138 mEq/L (ref 136–145)
Total Bilirubin: 0.84 mg/dL (ref 0.20–1.20)
Total Protein: 7.7 g/dL (ref 6.4–8.3)

## 2015-07-03 MED ORDER — ONDANSETRON HCL 8 MG PO TABS: 8.0000 mg | ORAL_TABLET | Freq: Three times a day (TID) | ORAL | Status: DC

## 2015-07-03 MED ORDER — SODIUM CHLORIDE 0.9 % IJ SOLN
10.0000 mL | INTRAMUSCULAR | Status: DC | PRN
  Administered 2015-07-03: 10 mL via INTRAVENOUS
  Filled 2015-07-03: qty 10

## 2015-07-03 MED ORDER — SODIUM CHLORIDE 0.9 % IV SOLN
1600.0000 mg/m2 | INTRAVENOUS | Status: DC
  Administered 2015-07-03: 2850 mg via INTRAVENOUS
  Filled 2015-07-03: qty 57

## 2015-07-03 MED ORDER — ALPRAZOLAM 0.5 MG PO TABS
0.5000 mg | ORAL_TABLET | Freq: Three times a day (TID) | ORAL | Status: DC | PRN
Start: 2015-07-03 — End: 2015-07-17

## 2015-07-03 MED ORDER — PROCHLORPERAZINE MALEATE 10 MG PO TABS: 10.0000 mg | ORAL_TABLET | Freq: Four times a day (QID) | ORAL | Status: AC | PRN

## 2015-07-03 MED ORDER — ESZOPICLONE 1 MG PO TABS: 1.0000 mg | ORAL_TABLET | Freq: Every evening | ORAL | Status: DC | PRN

## 2015-07-03 MED ORDER — PALONOSETRON HCL INJECTION 0.25 MG/5ML
INTRAVENOUS | Status: AC
  Filled 2015-07-03: qty 5

## 2015-07-03 MED ORDER — SODIUM CHLORIDE 0.9 % IV SOLN
Freq: Once | INTRAVENOUS | Status: AC
  Administered 2015-07-03: 10:00:00 via INTRAVENOUS
  Filled 2015-07-03: qty 5

## 2015-07-03 MED ORDER — OXYCODONE HCL 5 MG PO TABS: ORAL_TABLET | ORAL | Status: DC

## 2015-07-03 MED ORDER — MEGESTROL ACETATE 40 MG/ML PO SUSP
200.0000 mg | Freq: Two times a day (BID) | ORAL | Status: DC
Start: 2015-07-03 — End: 2015-08-28

## 2015-07-03 MED ORDER — PALONOSETRON HCL INJECTION 0.25 MG/5ML
0.2500 mg | Freq: Once | INTRAVENOUS | Status: AC
  Administered 2015-07-03: 0.25 mg via INTRAVENOUS

## 2015-07-03 MED ORDER — SODIUM CHLORIDE 0.9 % IV SOLN
Freq: Once | INTRAVENOUS | Status: AC
  Administered 2015-07-03: 10:00:00 via INTRAVENOUS

## 2015-07-03 MED ORDER — BEVACIZUMAB CHEMO INJECTION 400 MG/16ML
5.0000 mg/kg | Freq: Once | INTRAVENOUS | Status: AC
  Administered 2015-07-03: 350 mg via INTRAVENOUS
  Filled 2015-07-03: qty 14

## 2015-07-03 MED ORDER — IRINOTECAN HCL CHEMO INJECTION 100 MG/5ML
136.0000 mg/m2 | Freq: Once | INTRAVENOUS | Status: AC
  Administered 2015-07-03: 240 mg via INTRAVENOUS
  Filled 2015-07-03: qty 12

## 2015-07-03 MED ORDER — LEUCOVORIN CALCIUM INJECTION 100 MG
20.0000 mg/m2 | Freq: Once | INTRAMUSCULAR | Status: AC
  Administered 2015-07-03: 36 mg via INTRAVENOUS
  Filled 2015-07-03: qty 1.8

## 2015-07-03 NOTE — Telephone Encounter (Signed)
TC from patient stating that her pharmacy is charging $400 for her zofran and patient is asking for compazine instead.  Informed Amy Horton, RN with Dr. Benay Spice of pt's request.

## 2015-07-03 NOTE — Telephone Encounter (Signed)
Per staff message and POF I have scheduled appts. Advised scheduler of appts. JMW  

## 2015-07-03 NOTE — Patient Instructions (Signed)

## 2015-07-03 NOTE — Telephone Encounter (Signed)
Talked with and scheduled this patient’s appointment(s) while patient was here in our office.       [facility]. °

## 2015-07-03 NOTE — Progress Notes (Signed)
[facility] [place] OFFICE PROGRESS NOTE   Diagnosis: Adenocarcinoma of the ovary  INTERVAL HISTORY:   Brianna Vasquez returns as scheduled. She feels better than she did last week. She continues to have anorexia and left hip and leg discomfort. She believes MS Contin is causing nausea. She is taking the MS Contin only at night. She continues oxycodone as needed.  Objective:  Vital signs in last 24 hours:  Blood pressure 104/66, pulse 108, temperature 98.2 F (36.8 C), temperature source Oral, resp. rate 18, height '5\' 5"'$  (1.651 m), weight 133 lb 8 oz (60.555 kg), SpO2 99 %.    HEENT: No thrush or ulcers Resp: Lungs clear bilaterally Cardio: Regular rate and rhythm GI: No hepatosplenomegaly, no mass Vascular: No leg edema     Portacath/PICC-without erythema  Lab Results:  Lab Results  Component Value Date   WBC 11.9* 07/03/2015   HGB 11.3* 07/03/2015   HCT 34.4* 07/03/2015   MCV 97.4 07/03/2015   PLT 366 07/03/2015   NEUTROABS 8.0* 07/03/2015     Medications: I have reviewed the patient's current medications.  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 Metastatic left periaortic adenopathy.  PET scan 02/06/2015 with extensive hypermetabolic lymphadenopathy in the chest, abdomen, and pelvis, Residual left pelvic tumor, sigmoid colon lesion, liver/lung metastases, and a left acetabulum metastasis  Colonoscopy 02/09/2015 with a stricture at 30 cm, biopsy positive for adenocarcinoma, no intrinsic colon mass noted  Cycle 1 FOLFOX 02/14/2015  Cycle 2 FOLFOX 02/28/2015  CT 03/16/2015 with improvement in  lung metastases, stable liver lesions and left pelvic soft tissue fullness, right ovarian vein thrombosis  Cycle 3 FOLFOX 03/26/2015 with Neulasta support  Cycle 4 FOLFOX 04/16/2015  Cycle 5 FOLFOX 05/07/2015 (oxaliplatin and 5-FU dose reduced, 5-FU bolus eliminated)  CTs chest, abdomen, and pelvis on 06/05/2015-mixed response of lung metastases, progression of hepatic metastases, decreased soft tissue in the left retroperitoneal space and left psoas, increased mixed lytic/sclerotic metastasis at the left acetabulum  Cycle 1 FOLFIRI/Avastin 06/13/2015  Cycle 2 FOLFIRI/Avastin 07/03/2015  2. Placement of a left ureter stent 01/16/2015  3. Family history of multiple cancers, report of a paternal cousin-BRCA2 positive  4. Upper endoscopy 01/29/2015 with a localized thick gastric fold with a small ulcer  5. Mild swelling of the left foot and ankle 01/27/2015  6. Pain secondary to the left pelvic tumor and acetabulum metastasis-progressive acetabulum metastasis on the CT 06/05/2015  Palliative radiation, 1 fraction, 06/07/2015  7. Acute/delayed nausea following FOLFOX-Aloxi and amend were added with cycle 2, prophylactic Decadron with cycle 3  8. Acute transient loss of bladder control with cycle 1 FOLFOX  9. Severe neutropenia following cycle 2 FOLFOX. Neulasta added beginning with cycle 3.  10. Admission 04/26/2015 with severe mucositis secondary to chemotherapy  11. History of Pancytopenia secondary chemotherapy-red cell transfusion 04/28/2015, platelet transfusion 04/28/2015  Disposition:  Her performance status appears improved today, though she continues to have anorexia and pain. She will discontinue the MS Contin for a few days to see if this helps the nausea. If the nausea improves she will contact us and we will try OxyContin or Duragesic.  Brianna Vasquez will begin a trial of Megace for anorexia.  She  will complete cycle 2 FOLFIRI/Avastin today. Brianna Vasquez  will return for an office visit and chemotherapy in 2 weeks.  Betsy Coder, MD  07/03/2015  9:31 AM

## 2015-07-03 NOTE — Patient Instructions (Signed)
[facility] Discharge Instructions for Patients Receiving Chemotherapy  Today you received the following chemotherapy agents Avastin, Irinotecan, 5FU, Leukovorin  To help prevent nausea and vomiting after your treatment, we encourage you to take your nausea medication as directed   If you develop nausea and vomiting that is not controlled by your nausea medication, call the clinic.   BELOW ARE SYMPTOMS THAT SHOULD BE REPORTED IMMEDIATELY:  *FEVER GREATER THAN 100.5 F  *CHILLS WITH OR WITHOUT FEVER  NAUSEA AND VOMITING THAT IS NOT CONTROLLED WITH YOUR NAUSEA MEDICATION  *UNUSUAL SHORTNESS OF BREATH  *UNUSUAL BRUISING OR BLEEDING  TENDERNESS IN MOUTH AND THROAT WITH OR WITHOUT PRESENCE OF ULCERS  *URINARY PROBLEMS  *BOWEL PROBLEMS  UNUSUAL RASH Items with * indicate a potential emergency and should be followed up as soon as possible.  Feel free to call the clinic you have any questions or concerns. The clinic phone number is [phone].  Please show the [facility] at check-in to the Emergency Department and triage nurse.  Bevacizumab injection What is this medicine? BEVACIZUMAB (be va SIZ yoo mab) is a monoclonal antibody. It is used to treat cervical cancer, colorectal cancer, glioblastoma multiforme, non-small cell lung cancer (NSCLC), ovarian cancer, and renal cell cancer. This medicine may be used for other purposes; ask your health care provider or pharmacist if you have questions. What should I tell my health care provider before I take this medicine? They need to know if you have any of these conditions: -blood clots -heart disease, including heart failure, heart attack, or chest pain (angina) -high blood pressure -infection (especially a virus infection such as chickenpox, cold sores, or herpes) -kidney disease -lung disease -prior chemotherapy with doxorubicin, daunorubicin, epirubicin, or other anthracycline type chemotherapy  agents -recent or ongoing radiation therapy -recent surgery -stroke -an unusual or allergic reaction to bevacizumab, hamster proteins, mouse proteins, other medicines, foods, dyes, or preservatives -pregnant or trying to get pregnant -breast-feeding How should I use this medicine? This medicine is for infusion into a vein. It is given by a health care professional in a hospital or clinic setting. Talk to your pediatrician regarding the use of this medicine in children. Special care may be needed. Overdosage: If you think you have taken too much of this medicine contact a poison control center or emergency room at once. NOTE: This medicine is only for you. Do not share this medicine with others. What if I miss a dose? It is important not to miss your dose. Call your doctor or health care professional if you are unable to keep an appointment. What may interact with this medicine? Interactions are not expected. This list may not describe all possible interactions. Give your health care provider a list of all the medicines, herbs, non-prescription drugs, or dietary supplements you use. Also tell them if you smoke, drink alcohol, or use illegal drugs. Some items may interact with your medicine. What should I watch for while using this medicine? Your condition will be monitored carefully while you are receiving this medicine. You will need important blood work and urine testing done while you are taking this medicine. During your treatment, let your health care professional know if you have any unusual symptoms, such as difficulty breathing. This medicine may rarely cause 'gastrointestinal perforation' (holes in the stomach, intestines or colon), a serious side effect requiring surgery to repair. This medicine should be started at least 28 days following major surgery and the site of the surgery should  be totally healed. Check with your doctor before scheduling dental work or surgery while you are  receiving this treatment. Talk to your doctor if you have recently had surgery or if you have a wound that has not healed. Do not become pregnant while taking this medicine or for 6 months after stopping it. Women should inform their doctor if they wish to become pregnant or think they might be pregnant. There is a potential for serious side effects to an unborn child. Talk to your health care professional or pharmacist for more information. Do not breast-feed an infant while taking this medicine. This medicine has caused ovarian failure in some women. This medicine may interfere with the ability to have a child. You should talk to your doctor or health care professional if you are concerned about your fertility. What side effects may I notice from receiving this medicine? Side effects that you should report to your doctor or health care professional as soon as possible: -allergic reactions like skin rash, itching or hives, swelling of the face, lips, or tongue -signs of infection - fever or chills, cough, sore throat, pain or trouble passing urine -signs of decreased platelets or bleeding - bruising, pinpoint red spots on the skin, black, tarry stools, nosebleeds, blood in the urine -breathing problems -changes in vision -chest pain -confusion -jaw pain, especially after dental work -mouth sores -seizures -severe abdominal pain -severe headache -sudden numbness or weakness of the face, arm or leg -swelling of legs or ankles -symptoms of a stroke: change in mental awareness, inability to talk or move one side of the body (especially in patients with lung cancer) -trouble passing urine or change in the amount of urine -trouble speaking or understanding -trouble walking, dizziness, loss of balance or coordination Side effects that usually do not require medical attention (report to your doctor or health care professional if they continue or are bothersome): -constipation -diarrhea -dry  skin -headache -loss of appetite -nausea, vomiting This list may not describe all possible side effects. Call your doctor for medical advice about side effects. You may report side effects to FDA at 1-800-FDA-1088. Where should I keep my medicine? This drug is given in a hospital or clinic and will not be stored at home. NOTE: This sheet is a summary. It may not cover all possible information. If you have questions about this medicine, talk to your doctor, pharmacist, or health care provider.    2016, Elsevier/Gold Standard. (2014-07-25 16:58:44)  Irinotecan injection What is this medicine? IRINOTECAN (ir in oh TEE kan ) is a chemotherapy drug. It is used to treat colon and rectal cancer. This medicine may be used for other purposes; ask your health care provider or pharmacist if you have questions. What should I tell my health care provider before I take this medicine? They need to know if you have any of these conditions: -blood disorders -dehydration -diarrhea -infection (especially a virus infection such as chickenpox, cold sores, or herpes) -liver disease -low blood counts, like low white cell, platelet, or red cell counts -recent or ongoing radiation therapy -an unusual or allergic reaction to irinotecan, sorbitol, other chemotherapy, other medicines, foods, dyes, or preservatives -pregnant or trying to get pregnant -breast-feeding How should I use this medicine? This drug is given as an infusion into a vein. It is administered in a hospital or clinic by a specially trained health care professional. Talk to your pediatrician regarding the use of this medicine in children. Special care may be needed. Overdosage:  If you think you have taken too much of this medicine contact a poison control center or emergency room at once. NOTE: This medicine is only for you. Do not share this medicine with others. What if I miss a dose? It is important not to miss your dose. Call your doctor  or health care professional if you are unable to keep an appointment. What may interact with this medicine? Do not take this medicine with any of the following medications: -atazanavir -certain medicines for fungal infections like itraconazole and ketoconazole -St. John's Wort This medicine may also interact with the following medications: -dexamethasone -diuretics -laxatives -medicines for seizures like carbamazepine, mephobarbital, phenobarbital, phenytoin, primidone -medicines to increase blood counts like filgrastim, pegfilgrastim, sargramostim -prochlorperazine -vaccines This list may not describe all possible interactions. Give your health care provider a list of all the medicines, herbs, non-prescription drugs, or dietary supplements you use. Also tell them if you smoke, drink alcohol, or use illegal drugs. Some items may interact with your medicine. What should I watch for while using this medicine? Your condition will be monitored carefully while you are receiving this medicine. You will need important blood work done while you are taking this medicine. This drug may make you feel generally unwell. This is not uncommon, as chemotherapy can affect healthy cells as well as cancer cells. Report any side effects. Continue your course of treatment even though you feel ill unless your doctor tells you to stop. In some cases, you may be given additional medicines to help with side effects. Follow all directions for their use. You may get drowsy or dizzy. Do not drive, use machinery, or do anything that needs mental alertness until you know how this medicine affects you. Do not stand or sit up quickly, especially if you are an older patient. This reduces the risk of dizzy or fainting spells. Call your doctor or health care professional for advice if you get a fever, chills or sore throat, or other symptoms of a cold or flu. Do not treat yourself. This drug decreases your body's ability to fight  infections. Try to avoid being around people who are sick. This medicine may increase your risk to bruise or bleed. Call your doctor or health care professional if you notice any unusual bleeding. Be careful brushing and flossing your teeth or using a toothpick because you may get an infection or bleed more easily. If you have any dental work done, tell your dentist you are receiving this medicine. Avoid taking products that contain aspirin, acetaminophen, ibuprofen, naproxen, or ketoprofen unless instructed by your doctor. These medicines may hide a fever. Do not become pregnant while taking this medicine. Women should inform their doctor if they wish to become pregnant or think they might be pregnant. There is a potential for serious side effects to an unborn child. Talk to your health care professional or pharmacist for more information. Do not breast-feed an infant while taking this medicine. What side effects may I notice from receiving this medicine? Side effects that you should report to your doctor or health care professional as soon as possible: -allergic reactions like skin rash, itching or hives, swelling of the face, lips, or tongue -low blood counts - this medicine may decrease the number of white blood cells, red blood cells and platelets. You may be at increased risk for infections and bleeding. -signs of infection - fever or chills, cough, sore throat, pain or difficulty passing urine -signs of decreased platelets or bleeding -  bruising, pinpoint red spots on the skin, black, tarry stools, blood in the urine -signs of decreased red blood cells - unusually weak or tired, fainting spells, lightheadedness -breathing problems -chest pain -diarrhea -feeling faint or lightheaded, falls -flushing, runny nose, sweating during infusion -mouth sores or pain -pain, swelling, redness or irritation where injected -pain, swelling, warmth in the leg -pain, tingling, numbness in the hands or  feet -problems with balance, talking, walking -stomach cramps, pain -trouble passing urine or change in the amount of urine -vomiting as to be unable to hold down drinks or food -yellowing of the eyes or skin Side effects that usually do not require medical attention (report to your doctor or health care professional if they continue or are bothersome): -constipation -hair loss -headache -loss of appetite -nausea, vomiting -stomach upset This list may not describe all possible side effects. Call your doctor for medical advice about side effects. You may report side effects to FDA at 1-800-FDA-1088. Where should I keep my medicine? This drug is given in a hospital or clinic and will not be stored at home. NOTE: This sheet is a summary. It may not cover all possible information. If you have questions about this medicine, talk to your doctor, pharmacist, or health care provider.    2016, Elsevier/Gold Standard. (2012-11-22 16:29:32)

## 2015-07-05 ENCOUNTER — Ambulatory Visit (HOSPITAL_BASED_OUTPATIENT_CLINIC_OR_DEPARTMENT_OTHER): Payer: Self-pay

## 2015-07-05 ENCOUNTER — Ambulatory Visit: Payer: Self-pay

## 2015-07-05 DIAGNOSIS — C787 Secondary malignant neoplasm of liver and intrahepatic bile duct: Secondary | ICD-10-CM

## 2015-07-05 DIAGNOSIS — C562 Malignant neoplasm of left ovary: Secondary | ICD-10-CM

## 2015-07-05 DIAGNOSIS — C7800 Secondary malignant neoplasm of unspecified lung: Secondary | ICD-10-CM

## 2015-07-05 DIAGNOSIS — C801 Malignant (primary) neoplasm, unspecified: Secondary | ICD-10-CM

## 2015-07-05 DIAGNOSIS — Z5189 Encounter for other specified aftercare: Secondary | ICD-10-CM

## 2015-07-05 DIAGNOSIS — C5702 Malignant neoplasm of left fallopian tube: Secondary | ICD-10-CM

## 2015-07-05 DIAGNOSIS — C7951 Secondary malignant neoplasm of bone: Secondary | ICD-10-CM

## 2015-07-05 MED ORDER — SODIUM CHLORIDE 0.9 % IJ SOLN
10.0000 mL | INTRAMUSCULAR | Status: DC | PRN
  Administered 2015-07-05: 10 mL
  Filled 2015-07-05: qty 10

## 2015-07-05 MED ORDER — PEGFILGRASTIM INJECTION 6 MG/0.6ML ~~LOC~~
6.0000 mg | PREFILLED_SYRINGE | Freq: Once | SUBCUTANEOUS | Status: AC
  Administered 2015-07-05: 6 mg via SUBCUTANEOUS
  Filled 2015-07-05: qty 0.6

## 2015-07-05 MED ORDER — HEPARIN SOD (PORK) LOCK FLUSH 100 UNIT/ML IV SOLN
500.0000 [IU] | Freq: Once | INTRAVENOUS | Status: AC | PRN
  Administered 2015-07-05: 500 [IU]
  Filled 2015-07-05: qty 5

## 2015-07-05 NOTE — Progress Notes (Signed)
Neulasta injection given by infusion nurse after home pump disconnected

## 2015-07-06 ENCOUNTER — Telehealth: Payer: Self-pay

## 2015-07-06 ENCOUNTER — Ambulatory Visit (HOSPITAL_BASED_OUTPATIENT_CLINIC_OR_DEPARTMENT_OTHER): Payer: Self-pay

## 2015-07-06 VITALS — BP 103/43 | HR 91 | Temp 98.5°F | Resp 18

## 2015-07-06 DIAGNOSIS — C7800 Secondary malignant neoplasm of unspecified lung: Secondary | ICD-10-CM

## 2015-07-06 DIAGNOSIS — C7951 Secondary malignant neoplasm of bone: Secondary | ICD-10-CM

## 2015-07-06 DIAGNOSIS — C801 Malignant (primary) neoplasm, unspecified: Secondary | ICD-10-CM

## 2015-07-06 DIAGNOSIS — R110 Nausea: Secondary | ICD-10-CM

## 2015-07-06 DIAGNOSIS — C787 Secondary malignant neoplasm of liver and intrahepatic bile duct: Secondary | ICD-10-CM

## 2015-07-06 DIAGNOSIS — C562 Malignant neoplasm of left ovary: Secondary | ICD-10-CM

## 2015-07-06 DIAGNOSIS — C5702 Malignant neoplasm of left fallopian tube: Secondary | ICD-10-CM

## 2015-07-06 MED ORDER — HEPARIN SOD (PORK) LOCK FLUSH 100 UNIT/ML IV SOLN
500.0000 [IU] | Freq: Once | INTRAVENOUS | Status: AC
Start: 2015-07-06 — End: 2015-07-06
  Administered 2015-07-06: 500 [IU] via INTRAVENOUS
  Filled 2015-07-06: qty 5

## 2015-07-06 MED ORDER — SODIUM CHLORIDE 0.9% FLUSH
10.0000 mL | INTRAVENOUS | Status: DC | PRN
  Administered 2015-07-06: 10 mL via INTRAVENOUS
  Filled 2015-07-06: qty 10

## 2015-07-06 MED ORDER — SODIUM CHLORIDE 0.9 % IV SOLN
Freq: Once | INTRAVENOUS | Status: AC
  Administered 2015-07-06: 11:00:00 via INTRAVENOUS

## 2015-07-06 MED ORDER — SODIUM CHLORIDE 0.9 % IV SOLN
Freq: Once | INTRAVENOUS | Status: AC
  Administered 2015-07-06: 12:00:00 via INTRAVENOUS
  Filled 2015-07-06: qty 4

## 2015-07-06 NOTE — Telephone Encounter (Signed)
Yesterday her pump was dc'd. She was feeling weak and puny, her BP was 91/54, she was offered fluids but she could not stay d/t transportation issues. She is requesting fluids today. OK per Ned Card NP for fluids. Per Barnetta Chapel in infusion J32 is wide open. Called pt back and she will be in at 11 am.

## 2015-07-06 NOTE — Progress Notes (Signed)
Patient complains of nausea. Dr. Benay Spice notified. Order given and carried out for zofran 8 mg IVPB. Patient verbalized understanding to above mentioned plan.

## 2015-07-06 NOTE — Patient Instructions (Signed)

## 2015-07-13 ENCOUNTER — Ambulatory Visit
Admission: RE | Admit: 2015-07-13 | Discharge: 2015-07-13 | Disposition: A | Discharge status: Home / Self Care | Payer: Self-pay | Source: Ambulatory Visit | Attending: Radiation Oncology | Admitting: Radiation Oncology

## 2015-07-13 ENCOUNTER — Encounter: Payer: Self-pay | Admitting: Radiation Oncology

## 2015-07-13 VITALS — BP 109/64 | HR 112 | Temp 97.9°F | Ht 65.0 in | Wt 126.6 lb

## 2015-07-13 DIAGNOSIS — C7951 Secondary malignant neoplasm of bone: Secondary | ICD-10-CM

## 2015-07-13 NOTE — Progress Notes (Signed)
Brianna Vasquez is here for follow up of a single radiation treatment 06/06/16 to Left Hip. She still has pain in her Left Hip that radiates down her Left Leg. She is taking oxycodone 5 mg every 4 hours around the clock with relief. She is not able to take MSContin because of nausea. She also at times has nausea and takes compazine with relief. She reports her Left Hip area is swollen since this past weekend. She is not eating well per her report. She is eating very small amounts with each meal. She reports she is drinking 2-3 Boosts daily. She has Megace ordered, but was not able to get it because of the co-pay.   BP 109/64 mmHg  Pulse 112  Temp(Src) 97.9 F (36.6 C)  Ht 5\' 5"  (1.651 m)  Wt 126 lb 9.6 oz (57.425 kg)  BMI 21.07 kg/m2   Wt Readings from Last 3 Encounters:  07/13/15 126 lb 9.6 oz (57.425 kg)  07/03/15 133 lb 8 oz (60.555 kg)  06/27/15 135 lb 6.4 oz (61.417 kg)

## 2015-07-13 NOTE — Progress Notes (Signed)
Radiation Oncology         [phone] ________________________________  Name: Brianna Vasquez MRN: [id number]  Date: 07/13/2015  DOB: Oct 26, 1960  Follow-Up Visit Note  Outpatient  CC: Aretta Nip, MD  Ladell Pier, MD  Diagnosis and Prior Radiotherapy:    ICD-9-CM ICD-10-CM   1. Bone metastases ([place]) 198.5 C79.51    Completed left hip radiation 8 Gy / 1 fx in December 2016.  Narrative:  The patient returns today for routine follow-up. She still has pain in her Left Hip that radiates down her left leg. She is taking oxycodone 5 mg every 4 hours around the clock with relief. She is not able to take MSContin because of nausea. She also at times has nausea and takes compazine with relief. She reports her left hip area is swollen since this past weekend. She is not eating well per her report. She is eating very small amounts with each meal. She reports she is drinking 2-3 Boosts daily. She has Megace ordered, but was not able to get it because of the co-pay. She is receiving chemotherapy currently. She does not have a history of arthritis or a hip injury. She has not seen an orthopedist. Continues chemotherapy  ALLERGIES:  is allergic to other; ativan; and darvon.  Meds: Current Outpatient Prescriptions  Medication Sig Dispense Refill  . ALPRAZolam (XANAX) 0.5 MG tablet Take 1 tablet (0.5 mg total) by mouth every 8 (eight) hours as needed. for anxiety 30 tablet 0  . Ascorbic Acid (VITAMIN C) 1000 MG tablet Take 1,000 mg by mouth daily.    . calcium carbonate (TUMS - DOSED IN MG ELEMENTAL CALCIUM) 500 MG chewable tablet Chew 1-2 tablets by mouth 3 (three) times daily as needed for indigestion or heartburn.    Marland Kitchen ECHINACEA PO Take 1 tablet by mouth daily.    Marland Kitchen lidocaine-prilocaine (EMLA) cream Apply to port site one hour prior to use. Do not rub in. Cover with plastic. 30 g 0  . loperamide (IMODIUM) 1 MG/5ML solution Take 10 mLs (2 mg total) by mouth as needed for diarrhea or loose  stools. 120 mL 0  . oxyCODONE (OXY IR/ROXICODONE) 5 MG immediate release tablet Take 1-2 tabs every 4 hours as needed for pain 60 tablet 0  . POTASSIUM PO Take 1 tablet by mouth daily.    Marland Kitchen PRESCRIPTION MEDICATION Chemo at [place] through pump for 2 days at home and gets pump connected after Chemo IV at [facility] and then comes back to get it disconnected after the 2 days    . prochlorperazine (COMPAZINE) 10 MG tablet Take 1 tablet (10 mg total) by mouth every 6 (six) hours as needed for nausea. 30 tablet 0  . sucralfate (CARAFATE) 1 GM/10ML suspension Take 10 mLs (1 g total) by mouth 4 (four) times daily as needed. 420 mL 0  . B Complex Vitamins (B COMPLEX PO) Take 1 tablet by mouth daily. Reported on 07/13/2015    . dexamethasone 0.5 MG/5ML elixir Take 5 mls PO QID PRN mucositis. (Patient not taking: Reported on 07/13/2015) 80 mL 1  . eszopiclone (LUNESTA) 1 MG TABS tablet Take 1 tablet (1 mg total) by mouth at bedtime as needed for sleep. Take immediately before bedtime (Patient not taking: Reported on 07/13/2015) 30 tablet 0  . HYDROcodone-acetaminophen (HYCET) 7.5-325 mg/15 ml solution Take 15 mLs by mouth every 6 (six) hours as needed for moderate pain. (Patient not taking: Reported on 06/06/2015) 118 mL 0  .  HYDROmorphone (DILAUDID) 4 MG tablet Take 2-3 tab every 4 hours as needed for pain (Patient not taking: Reported on 07/13/2015) 150 tablet 0  . magic mouthwash w/lidocaine SOLN Take 5 mLs by mouth 4 (four) times daily as needed for mouth pain ([employer] formula w/ nystatin and lidocaine 1:1.). (Patient not taking: Reported on 07/13/2015) 240 mL 1  . megestrol (MEGACE) 40 MG/ML suspension Take 5 mLs (200 mg total) by mouth 2 (two) times daily. (Patient not taking: Reported on 07/13/2015) 240 mL 0  . morphine (MS CONTIN) 30 MG 12 hr tablet Take 1 tablet (30 mg total) by mouth every 12 (twelve) hours. As needed for pain (Patient not taking: Reported on 07/13/2015) 60 tablet 0  . ondansetron (ZOFRAN ODT) 4 MG  disintegrating tablet Take 1 tablet (4 mg total) by mouth every 8 (eight) hours as needed for nausea or vomiting. (Patient not taking: Reported on 07/13/2015) 20 tablet 0  . ondansetron (ZOFRAN) 8 MG tablet Take 1 tablet (8 mg total) by mouth every 8 (eight) hours. (Patient not taking: Reported on 07/13/2015) 90 tablet 0   No current facility-administered medications for this encounter.    Physical Findings: The patient is in no acute distress. Patient is alert and oriented.  height is 5\' 5"  (1.651 m) and weight is 126 lb 9.6 oz (57.425 kg). Her temperature is 97.9 F (36.6 C). Her blood pressure is 109/64 and her pulse is 112. . Thin, in WC Possibly some swelling at left ischial tuberosity.  Lab Findings: Lab Results  Component Value Date   WBC 11.9* 07/03/2015   HGB 11.3* 07/03/2015   HCT 34.4* 07/03/2015   MCV 97.4 07/03/2015   PLT 366 07/03/2015    Radiographic Findings: No results found.  Impression/Plan: Continued pain without palliation from RT.  I recommend that she gets a CT scan next week to check for anything else that could cause the pain she is having. We might consider retreating her hip over 2 1/2 weeks to induce palliation. I will call Dr. Marcelino Scot of ortho on Monday to see if I should refer her to him and to discuss this plan of care.  Pt knows him well through her sig other.  _____________________________________   Eppie Gibson, MD    This document serves as a record of services personally performed by Eppie Gibson, MD. It was created on her behalf by Lendon Collar, a trained medical scribe. The creation of this record is based on the scribe's personal observations and the provider's statements to them. This document has been checked and approved by the attending provider.

## 2015-07-15 ENCOUNTER — Other Ambulatory Visit: Payer: Self-pay | Admitting: Oncology

## 2015-07-17 ENCOUNTER — Telehealth: Payer: Self-pay | Admitting: Nurse Practitioner

## 2015-07-17 ENCOUNTER — Other Ambulatory Visit (HOSPITAL_BASED_OUTPATIENT_CLINIC_OR_DEPARTMENT_OTHER): Payer: Self-pay

## 2015-07-17 ENCOUNTER — Other Ambulatory Visit: Payer: Self-pay | Admitting: Radiation Oncology

## 2015-07-17 ENCOUNTER — Ambulatory Visit (HOSPITAL_BASED_OUTPATIENT_CLINIC_OR_DEPARTMENT_OTHER): Payer: Self-pay

## 2015-07-17 ENCOUNTER — Encounter: Payer: Self-pay | Admitting: Nurse Practitioner

## 2015-07-17 ENCOUNTER — Ambulatory Visit (HOSPITAL_BASED_OUTPATIENT_CLINIC_OR_DEPARTMENT_OTHER): Payer: Self-pay | Admitting: Nurse Practitioner

## 2015-07-17 VITALS — BP 113/74 | HR 96 | Temp 98.3°F | Resp 18 | Ht 65.0 in | Wt 128.4 lb

## 2015-07-17 DIAGNOSIS — C562 Malignant neoplasm of left ovary: Secondary | ICD-10-CM

## 2015-07-17 DIAGNOSIS — C787 Secondary malignant neoplasm of liver and intrahepatic bile duct: Secondary | ICD-10-CM

## 2015-07-17 DIAGNOSIS — C801 Malignant (primary) neoplasm, unspecified: Secondary | ICD-10-CM

## 2015-07-17 DIAGNOSIS — M25559 Pain in unspecified hip: Secondary | ICD-10-CM

## 2015-07-17 DIAGNOSIS — G8918 Other acute postprocedural pain: Secondary | ICD-10-CM

## 2015-07-17 DIAGNOSIS — C7800 Secondary malignant neoplasm of unspecified lung: Secondary | ICD-10-CM

## 2015-07-17 DIAGNOSIS — C5702 Malignant neoplasm of left fallopian tube: Secondary | ICD-10-CM

## 2015-07-17 DIAGNOSIS — Z5112 Encounter for antineoplastic immunotherapy: Secondary | ICD-10-CM

## 2015-07-17 DIAGNOSIS — Z809 Family history of malignant neoplasm, unspecified: Secondary | ICD-10-CM

## 2015-07-17 DIAGNOSIS — C7951 Secondary malignant neoplasm of bone: Secondary | ICD-10-CM

## 2015-07-17 DIAGNOSIS — K639 Disease of intestine, unspecified: Secondary | ICD-10-CM

## 2015-07-17 DIAGNOSIS — Z5111 Encounter for antineoplastic chemotherapy: Secondary | ICD-10-CM

## 2015-07-17 DIAGNOSIS — M25552 Pain in left hip: Secondary | ICD-10-CM

## 2015-07-17 DIAGNOSIS — C561 Malignant neoplasm of right ovary: Secondary | ICD-10-CM

## 2015-07-17 LAB — CBC WITH DIFFERENTIAL/PLATELET
BASO%: 1 % (ref 0.0–2.0)
BASOS ABS: 0.1 10*3/uL (ref 0.0–0.1)
EOS%: 5.2 % (ref 0.0–7.0)
Eosinophils Absolute: 0.5 10*3/uL (ref 0.0–0.5)
HEMATOCRIT: 39 % (ref 34.8–46.6)
HEMOGLOBIN: 12.5 g/dL (ref 11.6–15.9)
LYMPH#: 1.9 10*3/uL (ref 0.9–3.3)
LYMPH%: 19.1 % (ref 14.0–49.7)
MCH: 31.6 pg (ref 25.1–34.0)
MCHC: 32.1 g/dL (ref 31.5–36.0)
MCV: 98.7 fL (ref 79.5–101.0)
MONO#: 1 10*3/uL — AB (ref 0.1–0.9)
MONO%: 9.9 % (ref 0.0–14.0)
NEUT#: 6.5 10*3/uL (ref 1.5–6.5)
NEUT%: 64.8 % (ref 38.4–76.8)
PLATELETS: 317 10*3/uL (ref 145–400)
RBC: 3.95 10*6/uL (ref 3.70–5.45)
RDW: 16.2 % — AB (ref 11.2–14.5)
WBC: 10.1 10*3/uL (ref 3.9–10.3)

## 2015-07-17 LAB — COMPREHENSIVE METABOLIC PANEL
ALBUMIN: 3.4 g/dL — AB (ref 3.5–5.0)
ALT: 38 U/L (ref 0–55)
AST: 52 U/L — AB (ref 5–34)
Alkaline Phosphatase: 478 U/L — ABNORMAL HIGH (ref 40–150)
Anion Gap: 11 mEq/L (ref 3–11)
BUN: 8.9 mg/dL (ref 7.0–26.0)
CALCIUM: 9.9 mg/dL (ref 8.4–10.4)
CHLORIDE: 105 meq/L (ref 98–109)
CO2: 24 mEq/L (ref 22–29)
Creatinine: 0.8 mg/dL (ref 0.6–1.1)
EGFR: 84 mL/min/{1.73_m2} — ABNORMAL LOW (ref >90)
GLUCOSE: 122 mg/dL (ref 70–140)
POTASSIUM: 3.7 meq/L (ref 3.5–5.1)
Sodium: 140 mEq/L (ref 136–145)
Total Bilirubin: 0.61 mg/dL (ref 0.20–1.20)
Total Protein: 7.9 g/dL (ref 6.4–8.3)

## 2015-07-17 MED ORDER — ATROPINE SULFATE 1 MG/ML IJ SOLN
0.5000 mg | Freq: Once | INTRAMUSCULAR | Status: AC | PRN
  Administered 2015-07-17: 0.5 mg via INTRAVENOUS

## 2015-07-17 MED ORDER — PREDNISONE 10 MG PO TABS: 10.0000 mg | ORAL_TABLET | Freq: Every day | ORAL | Status: DC

## 2015-07-17 MED ORDER — PALONOSETRON HCL INJECTION 0.25 MG/5ML
0.2500 mg | Freq: Once | INTRAVENOUS | Status: AC
  Administered 2015-07-17: 0.25 mg via INTRAVENOUS

## 2015-07-17 MED ORDER — OXYCODONE HCL 5 MG PO TABS: ORAL_TABLET | ORAL | Status: DC

## 2015-07-17 MED ORDER — SODIUM CHLORIDE 0.9 % IV SOLN
Freq: Once | INTRAVENOUS | Status: AC
  Administered 2015-07-17: 12:00:00 via INTRAVENOUS
  Filled 2015-07-17: qty 5

## 2015-07-17 MED ORDER — ATROPINE SULFATE 1 MG/ML IJ SOLN
INTRAMUSCULAR | Status: AC
  Filled 2015-07-17: qty 1

## 2015-07-17 MED ORDER — SODIUM CHLORIDE 0.9 % IV SOLN: Freq: Once | INTRAVENOUS | Status: DC

## 2015-07-17 MED ORDER — LEUCOVORIN CALCIUM INJECTION 100 MG
20.0000 mg/m2 | Freq: Once | INTRAMUSCULAR | Status: AC
  Administered 2015-07-17: 36 mg via INTRAVENOUS
  Filled 2015-07-17: qty 1.8

## 2015-07-17 MED ORDER — SODIUM CHLORIDE 0.9 % IV SOLN
1600.0000 mg/m2 | INTRAVENOUS | Status: DC
  Administered 2015-07-17: 2850 mg via INTRAVENOUS
  Filled 2015-07-17: qty 57

## 2015-07-17 MED ORDER — SODIUM CHLORIDE 0.9 % IV SOLN
Freq: Once | INTRAVENOUS | Status: AC
  Administered 2015-07-17: 11:00:00 via INTRAVENOUS

## 2015-07-17 MED ORDER — SODIUM CHLORIDE 0.9 % IV SOLN
5.0000 mg/kg | Freq: Once | INTRAVENOUS | Status: AC
  Administered 2015-07-17: 300 mg via INTRAVENOUS
  Filled 2015-07-17: qty 12

## 2015-07-17 MED ORDER — ALPRAZOLAM 0.5 MG PO TABS: 0.5000 mg | ORAL_TABLET | Freq: Three times a day (TID) | ORAL | Status: DC | PRN

## 2015-07-17 MED ORDER — PALONOSETRON HCL INJECTION 0.25 MG/5ML
INTRAVENOUS | Status: AC
  Filled 2015-07-17: qty 5

## 2015-07-17 MED ORDER — IRINOTECAN HCL CHEMO INJECTION 100 MG/5ML
136.0000 mg/m2 | Freq: Once | INTRAVENOUS | Status: AC
  Administered 2015-07-17: 240 mg via INTRAVENOUS
  Filled 2015-07-17: qty 10

## 2015-07-17 MED ORDER — OXYCODONE HCL ER 20 MG PO T12A: 20.0000 mg | EXTENDED_RELEASE_TABLET | Freq: Two times a day (BID) | ORAL | Status: DC

## 2015-07-17 NOTE — Progress Notes (Signed)
[place] OFFICE PROGRESS NOTE   Diagnosis:  Adenocarcinoma of the ovary  INTERVAL HISTORY:   Ms. Ates returns as scheduled. She completed cycle 2 FOLFIRI/Avastin 07/03/2015. She had nausea beginning day 1. The nausea was present fairly consistently until day 9. She is now having intermittent nausea. No vomiting. She had loose stools lasting one day. At present she feels constipated. She takes a laxative as needed. She denies bleeding. No shortness of breath or chest pain. No leg swelling or calf pain. Hip pain has been improved over the past few days. She continues oxycodone as needed estimating 6 tablets a day on average. She would like to try OxyContin. She was unable to obtain Megace due to the cost. She would like to try something else for appetite.  Objective:  Vital signs in last 24 hours:  Blood pressure 113/74, pulse 96, temperature 98.3 F (36.8 C), temperature source Oral, resp. rate 18, height 5' 5" (1.651 m), weight 128 lb 6.4 oz (58.242 kg), SpO2 100 %.    HEENT: No thrush or ulcers. Resp: Lungs clear bilaterally. Cardio: Regular rate and rhythm. GI: Abdomen soft and nontender. No hepatomegaly. Vascular: No leg edema. Calves soft and nontender. Skin: No rash. Port-A-Cath without erythema.    Lab Results:  Lab Results  Component Value Date   WBC 10.1 07/17/2015   HGB 12.5 07/17/2015   HCT 39.0 07/17/2015   MCV 98.7 07/17/2015   PLT 317 07/17/2015   NEUTROABS 6.5 07/17/2015    Imaging:  No results found.  Medications: I have reviewed the patient's current medications.  Assessment/Plan: 1. 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 Metastatic left periaortic adenopathy.  PET scan 02/06/2015 with extensive hypermetabolic lymphadenopathy in the chest, abdomen, and pelvis, Residual left pelvic tumor, sigmoid colon lesion, liver/lung metastases, and a left acetabulum metastasis  Colonoscopy 02/09/2015 with a stricture at 30 cm, biopsy positive for adenocarcinoma, no intrinsic colon mass noted  Cycle 1 FOLFOX 02/14/2015  Cycle 2 FOLFOX 02/28/2015  CT 03/16/2015 with improvement in lung metastases, stable liver lesions and left pelvic soft tissue fullness, right ovarian vein thrombosis  Cycle 3 FOLFOX 03/26/2015 with Neulasta support  Cycle 4 FOLFOX 04/16/2015  Cycle 5 FOLFOX 05/07/2015 (oxaliplatin and 5-FU dose reduced, 5-FU bolus eliminated)  CTs chest, abdomen, and pelvis on 06/05/2015-mixed response of lung metastases, progression of hepatic metastases, decreased soft tissue in the left retroperitoneal space and left psoas, increased mixed lytic/sclerotic metastasis at the left acetabulum  Cycle 1 FOLFIRI/Avastin 06/13/2015  Cycle 2 FOLFIRI/Avastin 07/03/2015  Cycle 3 FOLFIRI/Avastin 07/17/2015  2. Placement of a left ureter stent 01/16/2015  3. Family history of multiple cancers, report of a paternal cousin-BRCA2 positive  4. Upper endoscopy 01/29/2015 with a localized thick gastric fold with a small ulcer  5. Mild swelling of the left foot and ankle 01/27/2015  6. Pain secondary to the left pelvic tumor and acetabulum metastasis-progressive acetabulum metastasis on the CT 06/05/2015  Palliative radiation, 1 fraction, 06/07/2015  7. Acute/delayed nausea following FOLFOX-Aloxi and amend were added with cycle 2, prophylactic Decadron with cycle 3  8. Acute transient loss of bladder control with cycle 1 FOLFOX  9. Severe neutropenia following cycle 2 FOLFOX. Neulasta added  beginning with cycle 3.  10. Admission 04/26/2015 with  severe mucositis secondary to chemotherapy  11. History of pancytopenia secondary chemotherapy-red cell transfusion 04/28/2015, platelet transfusion 04/28/2015    Disposition: Ms. Campau appears stable. She has completed 2 cycles of FOLFIRI/Avastin. Plan to proceed with cycle 3 today as scheduled. She will receive IV fluids on the day of pump discontinuation.  For the persistent hip pain she will begin OxyContin 20 mg every 12 hours and continue oxycodone as needed.  She was unable to obtain Megace due to the cost. She will begin prednisone 10 mg daily.  She will return for a follow-up visit and cycle 4 FOLFIRI/Avastin in 2 weeks. She will contact the office in the interim with any problems.  Plan reviewed with Dr. Benay Spice. 25 minutes were spent face-to-face at today's visit with the majority of that time involved in counseling/coordination of care.    Ned Card ANP/GNP-BC   07/17/2015  10:57 AM

## 2015-07-17 NOTE — Telephone Encounter (Signed)
Pt confirmed labs/ov per 02/07 POF, gave pt AVS and Calendar... KJ, sent msg to add chemo °

## 2015-07-17 NOTE — Patient Instructions (Signed)
[facility] Discharge Instructions for Patients Receiving Chemotherapy  Today you received the following chemotherapy agents: avastin, leucovorin, irinotecan, adrucil  To help prevent nausea and vomiting after your treatment, we encourage you to take your nausea medication.  Take it as often as prescribed.     If you develop nausea and vomiting that is not controlled by your nausea medication, call the clinic. If it is after clinic hours your family physician or the after hours number for the clinic or go to the Emergency Department.   BELOW ARE SYMPTOMS THAT SHOULD BE REPORTED IMMEDIATELY:  *FEVER GREATER THAN 100.5 F  *CHILLS WITH OR WITHOUT FEVER  NAUSEA AND VOMITING THAT IS NOT CONTROLLED WITH YOUR NAUSEA MEDICATION  *UNUSUAL SHORTNESS OF BREATH  *UNUSUAL BRUISING OR BLEEDING  TENDERNESS IN MOUTH AND THROAT WITH OR WITHOUT PRESENCE OF ULCERS  *URINARY PROBLEMS  *BOWEL PROBLEMS  UNUSUAL RASH Items with * indicate a potential emergency and should be followed up as soon as possible.  Feel free to call the clinic you have any questions or concerns. The clinic phone number is [phone].   I have been informed and understand all the instructions given to me. I know to contact the clinic, my physician, or go to the Emergency Department if any problems should occur. I do not have any questions at this time, but understand that I may call the clinic during office hours   should I have any questions or need assistance in obtaining follow up care.    __________________________________________  _____________  __________ Signature of Patient or Authorized Representative            Date                   Time    __________________________________________ Nurse's Signature

## 2015-07-18 ENCOUNTER — Telehealth: Payer: Self-pay | Admitting: *Deleted

## 2015-07-18 LAB — CEA: CEA1: 242.5 ng/mL — AB (ref 0.0–4.7)

## 2015-07-18 LAB — CEA (PARALLEL TESTING): CEA: 211.6 ng/mL

## 2015-07-18 NOTE — Telephone Encounter (Signed)
Called patient to inform of CT for 07-20-15- arrival time - 1:15 pm, no restrictions to the test, spoke with patient and she is aware of this test

## 2015-07-19 ENCOUNTER — Ambulatory Visit (HOSPITAL_BASED_OUTPATIENT_CLINIC_OR_DEPARTMENT_OTHER): Payer: Self-pay

## 2015-07-19 ENCOUNTER — Other Ambulatory Visit: Payer: Self-pay | Admitting: *Deleted

## 2015-07-19 ENCOUNTER — Other Ambulatory Visit: Payer: Self-pay | Admitting: Nurse Practitioner

## 2015-07-19 ENCOUNTER — Ambulatory Visit: Payer: Self-pay

## 2015-07-19 VITALS — BP 115/67 | HR 85 | Temp 97.9°F | Resp 17

## 2015-07-19 DIAGNOSIS — C562 Malignant neoplasm of left ovary: Secondary | ICD-10-CM

## 2015-07-19 DIAGNOSIS — R110 Nausea: Secondary | ICD-10-CM

## 2015-07-19 DIAGNOSIS — Z5189 Encounter for other specified aftercare: Secondary | ICD-10-CM

## 2015-07-19 DIAGNOSIS — C5702 Malignant neoplasm of left fallopian tube: Secondary | ICD-10-CM

## 2015-07-19 DIAGNOSIS — C801 Malignant (primary) neoplasm, unspecified: Secondary | ICD-10-CM

## 2015-07-19 DIAGNOSIS — C787 Secondary malignant neoplasm of liver and intrahepatic bile duct: Secondary | ICD-10-CM

## 2015-07-19 DIAGNOSIS — C7802 Secondary malignant neoplasm of left lung: Secondary | ICD-10-CM

## 2015-07-19 MED ORDER — SODIUM CHLORIDE 0.9 % IV SOLN
10.0000 mg | Freq: Once | INTRAVENOUS | Status: AC
  Administered 2015-07-19: 10 mg via INTRAVENOUS
  Filled 2015-07-19: qty 1

## 2015-07-19 MED ORDER — SODIUM CHLORIDE 0.9 % IV SOLN
INTRAVENOUS | Status: DC
  Administered 2015-07-19: 13:00:00 via INTRAVENOUS

## 2015-07-19 MED ORDER — HEPARIN SOD (PORK) LOCK FLUSH 100 UNIT/ML IV SOLN
500.0000 [IU] | Freq: Once | INTRAVENOUS | Status: AC | PRN
  Administered 2015-07-19: 500 [IU]
  Filled 2015-07-19: qty 5

## 2015-07-19 MED ORDER — PEGFILGRASTIM INJECTION 6 MG/0.6ML ~~LOC~~
6.0000 mg | PREFILLED_SYRINGE | Freq: Once | SUBCUTANEOUS | Status: AC
  Administered 2015-07-19: 6 mg via SUBCUTANEOUS
  Filled 2015-07-19: qty 0.6

## 2015-07-19 MED ORDER — SODIUM CHLORIDE 0.9 % IJ SOLN
10.0000 mL | INTRAMUSCULAR | Status: DC | PRN
  Administered 2015-07-19: 10 mL
  Filled 2015-07-19: qty 10

## 2015-07-19 NOTE — Progress Notes (Signed)
Neulasta injection given by infusion nurse 

## 2015-07-19 NOTE — Patient Instructions (Signed)

## 2015-07-20 ENCOUNTER — Encounter (HOSPITAL_COMMUNITY): Payer: Self-pay

## 2015-07-20 ENCOUNTER — Ambulatory Visit (HOSPITAL_COMMUNITY)
Admission: RE | Admit: 2015-07-20 | Discharge: 2015-07-20 | Disposition: A | Discharge status: Home / Self Care | Payer: Self-pay | Source: Ambulatory Visit | Attending: Radiation Oncology | Admitting: Radiation Oncology

## 2015-07-20 DIAGNOSIS — M25552 Pain in left hip: Secondary | ICD-10-CM | POA: Insufficient documentation

## 2015-07-20 DIAGNOSIS — C7951 Secondary malignant neoplasm of bone: Secondary | ICD-10-CM | POA: Insufficient documentation

## 2015-07-20 MED ORDER — ONDANSETRON HCL 8 MG PO TABS
ORAL_TABLET | ORAL | Status: AC
  Filled 2015-07-20: qty 1

## 2015-07-23 ENCOUNTER — Telehealth: Payer: Self-pay | Admitting: Radiation Oncology

## 2015-07-23 ENCOUNTER — Other Ambulatory Visit: Payer: Self-pay | Admitting: Radiation Oncology

## 2015-07-23 DIAGNOSIS — M25552 Pain in left hip: Secondary | ICD-10-CM

## 2015-07-23 DIAGNOSIS — C7951 Secondary malignant neoplasm of bone: Secondary | ICD-10-CM

## 2015-07-23 DIAGNOSIS — C562 Malignant neoplasm of left ovary: Secondary | ICD-10-CM

## 2015-07-23 NOTE — Telephone Encounter (Addendum)
I let Brianna Vasquez know that I spoke a Dr Altamese [place] of Ortho re her CT hip results.  We discussed her persistent symptoms of hip pain, and CT findings in the bones and psoas muscle.  He graciously agreed to assess her in his office to determine if more imaging is appropriate to verify the cause of her pain. 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  MRI may be warranted to work up further.   I let him know, and Brianna Vasquez know, to call my office at any time if followup is needed with me.   -----------------------------------  Brianna Gibson, MD

## 2015-07-24 ENCOUNTER — Telehealth: Payer: Self-pay | Admitting: *Deleted

## 2015-07-24 NOTE — Telephone Encounter (Signed)
Called patient to inform of appt. With Dr. Altamese  on 08-01-15 - arrival time - 9:20 am, spoke with patient and she is aware of this appt.

## 2015-07-27 ENCOUNTER — Ambulatory Visit: Payer: Self-pay | Admitting: Oncology

## 2015-07-27 ENCOUNTER — Other Ambulatory Visit: Payer: Self-pay

## 2015-07-29 ENCOUNTER — Other Ambulatory Visit: Payer: Self-pay | Admitting: Oncology

## 2015-07-31 ENCOUNTER — Ambulatory Visit: Payer: Self-pay

## 2015-07-31 ENCOUNTER — Telehealth: Payer: Self-pay | Admitting: Oncology

## 2015-07-31 ENCOUNTER — Telehealth: Payer: Self-pay | Admitting: *Deleted

## 2015-07-31 ENCOUNTER — Ambulatory Visit: Payer: Self-pay | Admitting: Nutrition

## 2015-07-31 ENCOUNTER — Ambulatory Visit (HOSPITAL_BASED_OUTPATIENT_CLINIC_OR_DEPARTMENT_OTHER): Payer: Self-pay | Admitting: Oncology

## 2015-07-31 ENCOUNTER — Ambulatory Visit (HOSPITAL_BASED_OUTPATIENT_CLINIC_OR_DEPARTMENT_OTHER): Payer: Self-pay

## 2015-07-31 ENCOUNTER — Other Ambulatory Visit (HOSPITAL_BASED_OUTPATIENT_CLINIC_OR_DEPARTMENT_OTHER): Payer: Self-pay

## 2015-07-31 VITALS — BP 112/62

## 2015-07-31 VITALS — BP 91/46 | HR 82 | Temp 97.6°F | Resp 18 | Ht 65.0 in | Wt 131.0 lb

## 2015-07-31 DIAGNOSIS — C7951 Secondary malignant neoplasm of bone: Secondary | ICD-10-CM

## 2015-07-31 DIAGNOSIS — C787 Secondary malignant neoplasm of liver and intrahepatic bile duct: Secondary | ICD-10-CM

## 2015-07-31 DIAGNOSIS — C562 Malignant neoplasm of left ovary: Secondary | ICD-10-CM

## 2015-07-31 DIAGNOSIS — C5702 Malignant neoplasm of left fallopian tube: Secondary | ICD-10-CM

## 2015-07-31 DIAGNOSIS — Z95828 Presence of other vascular implants and grafts: Secondary | ICD-10-CM

## 2015-07-31 DIAGNOSIS — C801 Malignant (primary) neoplasm, unspecified: Secondary | ICD-10-CM

## 2015-07-31 DIAGNOSIS — Z809 Family history of malignant neoplasm, unspecified: Secondary | ICD-10-CM

## 2015-07-31 DIAGNOSIS — C7800 Secondary malignant neoplasm of unspecified lung: Secondary | ICD-10-CM

## 2015-07-31 DIAGNOSIS — Z5112 Encounter for antineoplastic immunotherapy: Secondary | ICD-10-CM

## 2015-07-31 DIAGNOSIS — Z5111 Encounter for antineoplastic chemotherapy: Secondary | ICD-10-CM

## 2015-07-31 DIAGNOSIS — G8918 Other acute postprocedural pain: Secondary | ICD-10-CM

## 2015-07-31 LAB — CBC WITH DIFFERENTIAL/PLATELET
BASO%: 1.1 % (ref 0.0–2.0)
BASOS ABS: 0.1 10*3/uL (ref 0.0–0.1)
EOS%: 4 % (ref 0.0–7.0)
Eosinophils Absolute: 0.4 10*3/uL (ref 0.0–0.5)
HCT: 34.8 % (ref 34.8–46.6)
HEMOGLOBIN: 11.1 g/dL — AB (ref 11.6–15.9)
LYMPH#: 2.2 10*3/uL (ref 0.9–3.3)
LYMPH%: 24.2 % (ref 14.0–49.7)
MCH: 31.1 pg (ref 25.1–34.0)
MCHC: 31.9 g/dL (ref 31.5–36.0)
MCV: 97.5 fL (ref 79.5–101.0)
MONO#: 0.9 10*3/uL (ref 0.1–0.9)
MONO%: 10.2 % (ref 0.0–14.0)
NEUT#: 5.6 10*3/uL (ref 1.5–6.5)
NEUT%: 60.5 % (ref 38.4–76.8)
NRBC: 0 % (ref 0–0)
Platelets: 300 10*3/uL (ref 145–400)
RBC: 3.57 10*6/uL — ABNORMAL LOW (ref 3.70–5.45)
RDW: 16 % — AB (ref 11.2–14.5)
WBC: 9.2 10*3/uL (ref 3.9–10.3)

## 2015-07-31 LAB — COMPREHENSIVE METABOLIC PANEL
ALBUMIN: 3.1 g/dL — AB (ref 3.5–5.0)
ALK PHOS: 414 U/L — AB (ref 40–150)
ALT: 35 U/L (ref 0–55)
AST: 40 U/L — AB (ref 5–34)
Anion Gap: 8 mEq/L (ref 3–11)
BILIRUBIN TOTAL: 0.64 mg/dL (ref 0.20–1.20)
BUN: 9.1 mg/dL (ref 7.0–26.0)
CO2: 26 mEq/L (ref 22–29)
CREATININE: 0.7 mg/dL (ref 0.6–1.1)
Calcium: 9.5 mg/dL (ref 8.4–10.4)
Chloride: 106 mEq/L (ref 98–109)
GLUCOSE: 83 mg/dL (ref 70–140)
Potassium: 4 mEq/L (ref 3.5–5.1)
SODIUM: 140 meq/L (ref 136–145)
TOTAL PROTEIN: 6.9 g/dL (ref 6.4–8.3)

## 2015-07-31 LAB — UA PROTEIN, DIPSTICK - CHCC: Protein, ur: 100 mg/dL

## 2015-07-31 MED ORDER — SODIUM CHLORIDE 0.9% FLUSH
10.0000 mL | INTRAVENOUS | Status: DC | PRN
  Administered 2015-07-31: 10 mL via INTRAVENOUS
  Filled 2015-07-31: qty 10

## 2015-07-31 MED ORDER — SODIUM CHLORIDE 0.9 % IV SOLN
Freq: Once | INTRAVENOUS | Status: AC
  Administered 2015-07-31: 11:00:00 via INTRAVENOUS

## 2015-07-31 MED ORDER — PALONOSETRON HCL INJECTION 0.25 MG/5ML
INTRAVENOUS | Status: AC
  Filled 2015-07-31: qty 5

## 2015-07-31 MED ORDER — ATROPINE SULFATE 1 MG/ML IJ SOLN
0.5000 mg | Freq: Once | INTRAMUSCULAR | Status: AC | PRN
  Administered 2015-07-31: 0.5 mg via INTRAVENOUS

## 2015-07-31 MED ORDER — SODIUM CHLORIDE 0.9 % IV SOLN
5.0000 mg/kg | Freq: Once | INTRAVENOUS | Status: AC
  Administered 2015-07-31: 300 mg via INTRAVENOUS
  Filled 2015-07-31: qty 12

## 2015-07-31 MED ORDER — OXYCODONE HCL 5 MG PO TABS: ORAL_TABLET | ORAL | Status: DC

## 2015-07-31 MED ORDER — SODIUM CHLORIDE 0.9 % IV SOLN
Freq: Once | INTRAVENOUS | Status: AC
  Administered 2015-07-31: 11:00:00 via INTRAVENOUS
  Filled 2015-07-31: qty 5

## 2015-07-31 MED ORDER — ALPRAZOLAM 0.5 MG PO TABS: 0.5000 mg | ORAL_TABLET | Freq: Three times a day (TID) | ORAL | Status: DC | PRN

## 2015-07-31 MED ORDER — LEUCOVORIN CALCIUM INJECTION 350 MG
395.0000 mg/m2 | Freq: Once | INTRAVENOUS | Status: AC
  Administered 2015-07-31: 700 mg via INTRAVENOUS
  Filled 2015-07-31: qty 35

## 2015-07-31 MED ORDER — SODIUM CHLORIDE 0.9 % IV SOLN
1600.0000 mg/m2 | INTRAVENOUS | Status: DC
  Administered 2015-07-31: 2850 mg via INTRAVENOUS
  Filled 2015-07-31: qty 57

## 2015-07-31 MED ORDER — PALONOSETRON HCL INJECTION 0.25 MG/5ML
0.2500 mg | Freq: Once | INTRAVENOUS | Status: AC
  Administered 2015-07-31: 0.25 mg via INTRAVENOUS

## 2015-07-31 MED ORDER — ESZOPICLONE 2 MG PO TABS
2.0000 mg | ORAL_TABLET | Freq: Every evening | ORAL | Status: AC | PRN
Start: 2015-07-31 — End: ?

## 2015-07-31 MED ORDER — SODIUM CHLORIDE 0.9 % IJ SOLN
10.0000 mL | INTRAMUSCULAR | Status: DC | PRN
  Filled 2015-07-31: qty 10

## 2015-07-31 MED ORDER — IRINOTECAN HCL CHEMO INJECTION 100 MG/5ML
136.0000 mg/m2 | Freq: Once | INTRAVENOUS | Status: AC
  Administered 2015-07-31: 240 mg via INTRAVENOUS
  Filled 2015-07-31: qty 10

## 2015-07-31 MED ORDER — HEPARIN SOD (PORK) LOCK FLUSH 100 UNIT/ML IV SOLN
500.0000 [IU] | Freq: Once | INTRAVENOUS | Status: DC | PRN
  Filled 2015-07-31: qty 5

## 2015-07-31 MED ORDER — ATROPINE SULFATE 1 MG/ML IJ SOLN
INTRAMUSCULAR | Status: AC
  Filled 2015-07-31: qty 1

## 2015-07-31 NOTE — Telephone Encounter (Signed)
Per staff message and POF I have scheduled appts. Advised scheduler of appts. JMW  

## 2015-07-31 NOTE — Progress Notes (Signed)
OK to treat with urine protein-100 per Dr. Benay Spice.

## 2015-07-31 NOTE — Telephone Encounter (Signed)
per pof to sch pt appt-sent MAW email to [facility] trmt-pt tog et updated copy b4 leaving

## 2015-07-31 NOTE — Patient Instructions (Signed)

## 2015-07-31 NOTE — Progress Notes (Signed)
Nutrition follow-up completed with patient during infusion for ovarian cancer. Current weight documented as 131 pounds February 21 This is increased from 126.6 pounds February 3.  This is decreased overall from 170.8 pounds 02/28/2015. Patient reports continued problems with nausea which lasts between chemotherapy appointments. States Compazine helps but does not completely resolve nausea especially early morning nausea.  Nutrition diagnosis:  Food and nutrition related knowledge deficit continues but has improved.  Intervention:  Patient educated on strategies for eating with nausea and vomiting. Recommended patient take nausea medications as prescribed by physician. Encouraged small frequent meals and snacks to promote weight maintenance. Recommended patient choose boost plus for additional calories and protein and provided coupons. Questions were answered.  Teach back method used.  Contact information given.  Monitoring, evaluation, goals: Patient will work to increase calories and protein to minimize weight loss throughout treatment.  Next visit: Tuesday, March 7, during infusion.  **Disclaimer: This note was dictated with voice recognition software. Similar sounding words can inadvertently be transcribed and this note may contain transcription errors which may not have been corrected upon publication of note.**

## 2015-07-31 NOTE — Progress Notes (Signed)
[facility] OFFICE PROGRESS NOTE   Diagnosis: Mucinous adenocarcinoma of the ovary  INTERVAL HISTORY:   B[facility]anna Vasquez returns as scheduled. She completed another cycle of FOLFIRI/Avastin 07/17/2015. She reports tolerating the chemotherapy well. No mouth sores. She had diarrhea following chemotherapy. The left lower abdomen and hip pain are much improved. She is no longer taking a long-acting narcotic and takes oxycodone approximately every 6 hours. She is scheduled to see orthopedics this week. She reports an improved appetite and energy level. She is not taking Megace or prednisone.  Objective:  Vital signs in last 24 hours:  Blood pressure 91/46, pulse 82, temperature 97.6 F (36.4 C), temperature source Oral, resp. rate 18, height '5\' 5"'$  (1.651 m), weight 131 lb (59.421 kg), SpO2 97 %.    HEENT: No thrush or ulcers Resp: Lungs clear bilaterally Cardio: Regular rate and rhythm GI: No hepatomegaly, mild tenderness in the left lower abdomen, no mass Vascular: No leg edema Musculoskeletal: No pain with motion at the left hip  Portacath/PICC-without erythema  Lab Results:  Lab Results  Component Value Date   WBC 9.2 07/31/2015   HGB 11.1* 07/31/2015   HCT 34.8 07/31/2015   MCV 97.5 07/31/2015   PLT 300 07/31/2015   NEUTROABS 5.6 07/31/2015      Lab Results  Component Value Date   CEA1 242.5* 07/17/2015    Imaging:  No results found.  Medications: I have reviewed the patient's current medications.  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 Metastatic left periaortic adenopathy.  PET  scan 02/06/2015 with extensive hypermetabolic lymphadenopathy in the chest, abdomen, and pelvis, Residual left pelvic tumor, sigmoid colon lesion, liver/lung metastases, and a left acetabulum metastasis  Colonoscopy 02/09/2015 with a stricture at 30 cm, biopsy positive for adenocarcinoma, no intrinsic colon mass noted  Cycle 1 FOLFOX 02/14/2015  Cycle 2 FOLFOX 02/28/2015  CT 03/16/2015 with improvement in lung metastases, stable liver lesions and left pelvic soft tissue fullness, right ovarian vein thrombosis  Cycle 3 FOLFOX 03/26/2015 with Neulasta support  Cycle 4 FOLFOX 04/16/2015  Cycle 5 FOLFOX 05/07/2015 (oxaliplatin and 5-FU dose reduced, 5-FU bolus eliminated)  CTs chest, abdomen, and pelvis on 06/05/2015-mixed response of lung metastases, progression of hepatic metastases, decreased soft tissue in the left retroperitoneal space and left psoas, increased mixed lytic/sclerotic metastasis at the left acetabulum  Cycle 1 FOLFIRI/Avastin 06/13/2015  Cycle 2 FOLFIRI/Avastin 07/03/2015  Cycle 3 FOLFIRI/Avastin 07/17/2015  Cycle 4 FOLFIRI/Avastin 07/31/2015  2. Placement of a left ureter stent 01/16/2015  3. Family history of multiple cancers, report of a paternal cousin-BRCA2 positive  4. Upper endoscopy 01/29/2015 with a localized thick gastric fold with a small ulcer  5. Mild swelling of the left foot and ankle 01/27/2015  6. Pain secondary to the left pelvic tumor and acetabulum metastasis-progressive acetabulum metastasis on the CT 06/05/2015  Palliative radiation, 1 fraction, 06/07/2015  7. Acute/delayed nausea following FOLFOX-Aloxi and amend were added with cycle 2, prophylactic Decadron with cycle 3  8. Acute transient loss of bladder control with cycle 1 FOLFOX  9. Severe neutropenia following cycle 2 FOLFOX. Neulasta added beginning with cycle 3.  10. Admission 04/26/2015 with severe mucositis secondary to chemotherapy  11. History  of  pancytopenia secondary chemotherapy-red cell transfusion 04/28/2015, platelet transfusion 04/28/2015    Disposition:  [facility] Vasquez  has completed 3 cycles of FOLFIRI/Avastin. Her performance status and pain have improved. We will follow-up on the CEA from today. She will complete cycle 4 FOLFIRI/Avastin today. The plan is to schedule a restaging CT evaluation after cycle 5.  [facility] Vasquez will return for an office visit and chemotherapy in 2 weeks.  Betsy Coder, MD  07/31/2015  10:02 AM

## 2015-07-31 NOTE — Patient Instructions (Signed)
[facility] [place] Discharge Instructions for Patients Receiving Chemotherapy  Today you received the following chemotherapy agents: avastin, leucovorin, irinotecan, adrucil  To help prevent nausea and vomiting after your treatment, we encourage you to take your nausea medication.  Take it as often as prescribed.     If you develop nausea and vomiting that is not controlled by your nausea medication, call the clinic. If it is after clinic hours your family physician or the after hours number for the clinic or go to the Emergency Department.   BELOW ARE SYMPTOMS THAT SHOULD BE REPORTED IMMEDIATELY:  *FEVER GREATER THAN 100.5 F  *CHILLS WITH OR WITHOUT FEVER  NAUSEA AND VOMITING THAT IS NOT CONTROLLED WITH YOUR NAUSEA MEDICATION  *UNUSUAL SHORTNESS OF BREATH  *UNUSUAL BRUISING OR BLEEDING  TENDERNESS IN MOUTH AND THROAT WITH OR WITHOUT PRESENCE OF ULCERS  *URINARY PROBLEMS  *BOWEL PROBLEMS  UNUSUAL RASH Items with * indicate a potential emergency and should be followed up as soon as possible.  Feel free to call the clinic you have any questions or concerns. The clinic phone number is [phone].   I have been informed and understand all the instructions given to me. I know to contact the clinic, my physician, or go to the Emergency Department if any problems should occur. I do not have any questions at this time, but understand that I may call the clinic during office hours   should I have any questions or need assistance in obtaining follow up care.    __________________________________________  _____________  __________ Signature of Patient or Authorized Representative            Date                   Time    __________________________________________ Nurse's Signature

## 2015-08-01 ENCOUNTER — Telehealth: Payer: Self-pay | Admitting: *Deleted

## 2015-08-01 LAB — CEA (PARALLEL TESTING): CEA: 174.3 ng/mL — ABNORMAL HIGH

## 2015-08-01 LAB — CEA: CEA: 224.2 ng/mL — ABNORMAL HIGH (ref 0.0–4.7)

## 2015-08-01 NOTE — Telephone Encounter (Signed)
Called pt with CEA result. Better, per Dr. Benay Spice. Follow up as scheduled. Pt voiced appreciation for call.

## 2015-08-01 NOTE — Telephone Encounter (Signed)
-----   Message from Ladell Pier, MD sent at 08/01/2015  1:15 PM EST ----- Please call patient, cea is better, f/u as scheduled

## 2015-08-02 ENCOUNTER — Ambulatory Visit (HOSPITAL_BASED_OUTPATIENT_CLINIC_OR_DEPARTMENT_OTHER): Payer: BLUE CROSS/BLUE SHIELD

## 2015-08-02 ENCOUNTER — Other Ambulatory Visit: Payer: Self-pay | Admitting: Nurse Practitioner

## 2015-08-02 ENCOUNTER — Ambulatory Visit: Payer: BLUE CROSS/BLUE SHIELD

## 2015-08-02 VITALS — BP 113/57 | HR 71 | Temp 98.5°F | Resp 18

## 2015-08-02 DIAGNOSIS — C7800 Secondary malignant neoplasm of unspecified lung: Secondary | ICD-10-CM

## 2015-08-02 DIAGNOSIS — C787 Secondary malignant neoplasm of liver and intrahepatic bile duct: Secondary | ICD-10-CM | POA: Diagnosis not present

## 2015-08-02 DIAGNOSIS — C5702 Malignant neoplasm of left fallopian tube: Secondary | ICD-10-CM | POA: Diagnosis not present

## 2015-08-02 DIAGNOSIS — C562 Malignant neoplasm of left ovary: Secondary | ICD-10-CM | POA: Diagnosis not present

## 2015-08-02 DIAGNOSIS — C7951 Secondary malignant neoplasm of bone: Secondary | ICD-10-CM

## 2015-08-02 DIAGNOSIS — C801 Malignant (primary) neoplasm, unspecified: Secondary | ICD-10-CM

## 2015-08-02 MED ORDER — SODIUM CHLORIDE 0.9 % IV SOLN: Freq: Once | INTRAVENOUS | Status: DC

## 2015-08-02 MED ORDER — SODIUM CHLORIDE 0.9 % IV SOLN
10.0000 mg | Freq: Once | INTRAVENOUS | Status: AC
  Administered 2015-08-02: 10 mg via INTRAVENOUS
  Filled 2015-08-02: qty 1

## 2015-08-02 MED ORDER — HEPARIN SOD (PORK) LOCK FLUSH 100 UNIT/ML IV SOLN
500.0000 [IU] | Freq: Once | INTRAVENOUS | Status: AC | PRN
Start: 2015-08-02 — End: 2015-08-02
  Administered 2015-08-02: 500 [IU]
  Filled 2015-08-02: qty 5

## 2015-08-02 MED ORDER — SODIUM CHLORIDE 0.9 % IJ SOLN
10.0000 mL | INTRAMUSCULAR | Status: DC | PRN
  Administered 2015-08-02 (×2): 10 mL
  Filled 2015-08-02: qty 10

## 2015-08-02 MED ORDER — PEGFILGRASTIM INJECTION 6 MG/0.6ML ~~LOC~~
6.0000 mg | PREFILLED_SYRINGE | Freq: Once | SUBCUTANEOUS | Status: AC
  Administered 2015-08-02: 6 mg via SUBCUTANEOUS
  Filled 2015-08-02: qty 0.6

## 2015-08-02 NOTE — Patient Instructions (Signed)

## 2015-08-02 NOTE — Progress Notes (Signed)
Neulasta injection given by infusion nurse 

## 2015-08-06 ENCOUNTER — Telehealth: Payer: Self-pay | Admitting: *Deleted

## 2015-08-06 NOTE — Telephone Encounter (Signed)
Received fax from [facility]: Pt called reporting temp of 100.7 on 2/25. She declined to go to ED. Called pt today to follow up. Left message requesting she call office.

## 2015-08-08 ENCOUNTER — Encounter: Payer: Self-pay | Admitting: Oncology

## 2015-08-08 NOTE — Progress Notes (Signed)
left in box [facility] [place] [facility] needs 04/10/15-present faxed [phone]

## 2015-08-10 NOTE — Progress Notes (Signed)
  Radiation Oncology         [phone] ________________________________  Name: [facility] Vasquez MRN: [id number]  Date: 06/07/2015  DOB: 06/23/1960  End of Treatment Note  Diagnosis:    ICD-9-CM ICD-10-CM    1. 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     Plan: The patient has completed radiation treatment. The patient will return to radiation oncology clinic for routine followup in one month. I advised them to call or return sooner if they have any questions or concerns related to their recovery or treatment.  -----------------------------------  Eppie Gibson, MD  This document serves as a record of services personally performed by Eppie Gibson, MD. It was created on her behalf by Derek Mound, a trained medical scribe. The creation of this record is based on the scribe's personal observations and the provider's statements to them. This document has been checked and approved by the attending provider.

## 2015-08-12 ENCOUNTER — Other Ambulatory Visit: Payer: Self-pay | Admitting: Oncology

## 2015-08-14 ENCOUNTER — Encounter: Payer: Self-pay | Admitting: Nurse Practitioner

## 2015-08-14 ENCOUNTER — Ambulatory Visit: Payer: BLUE CROSS/BLUE SHIELD | Admitting: Nutrition

## 2015-08-14 ENCOUNTER — Ambulatory Visit (HOSPITAL_BASED_OUTPATIENT_CLINIC_OR_DEPARTMENT_OTHER): Payer: BLUE CROSS/BLUE SHIELD | Admitting: Nurse Practitioner

## 2015-08-14 ENCOUNTER — Other Ambulatory Visit (HOSPITAL_BASED_OUTPATIENT_CLINIC_OR_DEPARTMENT_OTHER): Payer: BLUE CROSS/BLUE SHIELD

## 2015-08-14 ENCOUNTER — Ambulatory Visit (HOSPITAL_BASED_OUTPATIENT_CLINIC_OR_DEPARTMENT_OTHER): Payer: BLUE CROSS/BLUE SHIELD

## 2015-08-14 ENCOUNTER — Other Ambulatory Visit: Payer: Self-pay | Admitting: Nurse Practitioner

## 2015-08-14 ENCOUNTER — Telehealth: Payer: Self-pay | Admitting: Oncology

## 2015-08-14 ENCOUNTER — Telehealth: Payer: Self-pay

## 2015-08-14 VITALS — BP 102/42 | HR 99 | Resp 18

## 2015-08-14 VITALS — BP 124/72 | HR 104 | Temp 97.8°F | Resp 17 | Ht 65.0 in | Wt 130.6 lb

## 2015-08-14 DIAGNOSIS — G8918 Other acute postprocedural pain: Secondary | ICD-10-CM

## 2015-08-14 DIAGNOSIS — Z5111 Encounter for antineoplastic chemotherapy: Secondary | ICD-10-CM

## 2015-08-14 DIAGNOSIS — C7800 Secondary malignant neoplasm of unspecified lung: Secondary | ICD-10-CM | POA: Diagnosis not present

## 2015-08-14 DIAGNOSIS — C5702 Malignant neoplasm of left fallopian tube: Secondary | ICD-10-CM

## 2015-08-14 DIAGNOSIS — C562 Malignant neoplasm of left ovary: Secondary | ICD-10-CM

## 2015-08-14 DIAGNOSIS — C801 Malignant (primary) neoplasm, unspecified: Secondary | ICD-10-CM

## 2015-08-14 DIAGNOSIS — C787 Secondary malignant neoplasm of liver and intrahepatic bile duct: Secondary | ICD-10-CM

## 2015-08-14 DIAGNOSIS — C7951 Secondary malignant neoplasm of bone: Secondary | ICD-10-CM

## 2015-08-14 DIAGNOSIS — Z809 Family history of malignant neoplasm, unspecified: Secondary | ICD-10-CM

## 2015-08-14 DIAGNOSIS — Z5112 Encounter for antineoplastic immunotherapy: Secondary | ICD-10-CM

## 2015-08-14 DIAGNOSIS — R110 Nausea: Secondary | ICD-10-CM

## 2015-08-14 LAB — CBC WITH DIFFERENTIAL/PLATELET
BASO%: 0.6 % (ref 0.0–2.0)
BASOS ABS: 0.1 10*3/uL (ref 0.0–0.1)
EOS ABS: 0.4 10*3/uL (ref 0.0–0.5)
EOS%: 3.9 % (ref 0.0–7.0)
HCT: 34.8 % (ref 34.8–46.6)
HGB: 11.3 g/dL — ABNORMAL LOW (ref 11.6–15.9)
LYMPH%: 21.2 % (ref 14.0–49.7)
MCH: 30.8 pg (ref 25.1–34.0)
MCHC: 32.4 g/dL (ref 31.5–36.0)
MCV: 95.1 fL (ref 79.5–101.0)
MONO#: 0.9 10*3/uL (ref 0.1–0.9)
MONO%: 9.5 % (ref 0.0–14.0)
NEUT%: 64.8 % (ref 38.4–76.8)
NEUTROS ABS: 6.2 10*3/uL (ref 1.5–6.5)
PLATELETS: 305 10*3/uL (ref 145–400)
RBC: 3.66 10*6/uL — AB (ref 3.70–5.45)
RDW: 16.7 % — ABNORMAL HIGH (ref 11.2–14.5)
WBC: 9.6 10*3/uL (ref 3.9–10.3)
lymph#: 2 10*3/uL (ref 0.9–3.3)

## 2015-08-14 LAB — COMPREHENSIVE METABOLIC PANEL
ALBUMIN: 3 g/dL — AB (ref 3.5–5.0)
ALK PHOS: 371 U/L — AB (ref 40–150)
ALT: 36 U/L (ref 0–55)
ANION GAP: 9 meq/L (ref 3–11)
AST: 40 U/L — ABNORMAL HIGH (ref 5–34)
BILIRUBIN TOTAL: 0.47 mg/dL (ref 0.20–1.20)
BUN: 7.7 mg/dL (ref 7.0–26.0)
CO2: 25 meq/L (ref 22–29)
Calcium: 9.4 mg/dL (ref 8.4–10.4)
Chloride: 107 mEq/L (ref 98–109)
Creatinine: 0.8 mg/dL (ref 0.6–1.1)
EGFR: 80 mL/min/{1.73_m2} — AB (ref >90)
Glucose: 140 mg/dl (ref 70–140)
POTASSIUM: 3.5 meq/L (ref 3.5–5.1)
Sodium: 141 mEq/L (ref 136–145)
TOTAL PROTEIN: 7 g/dL (ref 6.4–8.3)

## 2015-08-14 LAB — UA PROTEIN, DIPSTICK - CHCC: Protein, ur: 100 mg/dL

## 2015-08-14 MED ORDER — ONDANSETRON HCL 8 MG PO TABS: 8.0000 mg | ORAL_TABLET | Freq: Three times a day (TID) | ORAL | Status: AC

## 2015-08-14 MED ORDER — ALPRAZOLAM 0.5 MG PO TABS: 0.5000 mg | ORAL_TABLET | Freq: Three times a day (TID) | ORAL | Status: DC | PRN

## 2015-08-14 MED ORDER — SODIUM CHLORIDE 0.9 % IV SOLN
Freq: Once | INTRAVENOUS | Status: AC
  Administered 2015-08-14: 11:00:00 via INTRAVENOUS
  Filled 2015-08-14: qty 5

## 2015-08-14 MED ORDER — ATROPINE SULFATE 1 MG/ML IJ SOLN
0.5000 mg | Freq: Once | INTRAMUSCULAR | Status: AC | PRN
Start: 2015-08-14 — End: 2015-08-14
  Administered 2015-08-14: 0.5 mg via INTRAVENOUS

## 2015-08-14 MED ORDER — FLUOROURACIL CHEMO INJECTION 5 GM/100ML
1600.0000 mg/m2 | INTRAVENOUS | Status: DC
  Administered 2015-08-14: 2850 mg via INTRAVENOUS
  Filled 2015-08-14: qty 57

## 2015-08-14 MED ORDER — BEVACIZUMAB CHEMO INJECTION 400 MG/16ML
5.0000 mg/kg | Freq: Once | INTRAVENOUS | Status: AC
  Administered 2015-08-14: 300 mg via INTRAVENOUS
  Filled 2015-08-14: qty 12

## 2015-08-14 MED ORDER — PALONOSETRON HCL INJECTION 0.25 MG/5ML
0.2500 mg | Freq: Once | INTRAVENOUS | Status: AC
  Administered 2015-08-14: 0.25 mg via INTRAVENOUS

## 2015-08-14 MED ORDER — SODIUM CHLORIDE 0.9 % IV SOLN
Freq: Once | INTRAVENOUS | Status: AC
  Administered 2015-08-14: 11:00:00 via INTRAVENOUS

## 2015-08-14 MED ORDER — DEXTROSE 5 % IV SOLN
136.0000 mg/m2 | Freq: Once | INTRAVENOUS | Status: AC
  Administered 2015-08-14: 240 mg via INTRAVENOUS
  Filled 2015-08-14: qty 10

## 2015-08-14 MED ORDER — ATROPINE SULFATE 1 MG/ML IJ SOLN
INTRAMUSCULAR | Status: AC
  Filled 2015-08-14: qty 1

## 2015-08-14 MED ORDER — PALONOSETRON HCL INJECTION 0.25 MG/5ML
INTRAVENOUS | Status: AC
  Filled 2015-08-14: qty 5

## 2015-08-14 MED ORDER — OXYCODONE HCL 5 MG PO TABS: ORAL_TABLET | ORAL | Status: DC

## 2015-08-14 MED ORDER — LEUCOVORIN CALCIUM INJECTION 350 MG
395.0000 mg/m2 | Freq: Once | INTRAVENOUS | Status: AC
  Administered 2015-08-14: 700 mg via INTRAVENOUS
  Filled 2015-08-14: qty 35

## 2015-08-14 NOTE — Progress Notes (Signed)
Nutrition follow-up completed with patient during infusion for ovarian cancer. Patient reports she has felt much better over the past 2 weeks. Weight improved documented as 130.6 pounds March 7 improved from 126.6 pounds February 3. Patient reports nausea has improved. Reports eating more but does continue to drink some oral nutrition supplements.  She has not tried boost plus yet.  Nutrition diagnosis: Food and nutrition related knowledge deficit improved.  Intervention: Patient educated on strategies for increasing calories and protein. Provided suggestions for continuing to increase oral intake even with nausea. Provided a sample of boost plus and encouraged patient to choose this supplement next time she has to purchase. Teach back method used.  Monitoring, evaluation, goals: Patient will work to increase calories and protein to minimize further weight loss.  Next visit: Thursday, March 23, during infusion.  **Disclaimer: This note was dictated with voice recognition software. Similar sounding words can inadvertently be transcribed and this note may contain transcription errors which may not have been corrected upon publication of note.**

## 2015-08-14 NOTE — Progress Notes (Signed)
[place] OFFICE PROGRESS NOTE   Diagnosis:  Mucinous adenocarcinoma of the ovary  INTERVAL HISTORY:   [facility] Vasquez returns as scheduled. She completed cycle 4 FOLFIRI/Avastin 07/31/2015. She continues to have low-grade nausea. No vomiting. No mouth sores. She tends to develop diarrhea following chemotherapy. The diarrhea lasts for 3-4 days. She takes Imodium as needed. She then develops constipation and begins a laxative. She had a spontaneous nosebleed over the weekend which lasted about 20 minutes. No other bleeding. No shortness of breath or chest pain. No leg swelling or calf pain.  Objective:  Vital signs in last 24 hours:  Blood pressure 124/72, pulse 104, temperature 97.8 F (36.6 C), temperature source Oral, resp. rate 17, height '5\' 5"'$  (1.651 m), weight 130 lb 9.6 oz (59.24 kg), SpO2 100 %.    HEENT: No thrush or ulcers. Resp: Lungs clear bilaterally. Cardio: Regular rate and rhythm. GI: Abdomen soft and nontender. No hepatomegaly. Vascular: No leg edema. Port-A-Cath without erythema.  Lab Results:  Lab Results  Component Value Date   WBC 9.6 08/14/2015   HGB 11.3* 08/14/2015   HCT 34.8 08/14/2015   MCV 95.1 08/14/2015   PLT 305 08/14/2015   NEUTROABS 6.2 08/14/2015    Imaging:  No results found.  Medications: I have reviewed the patient's current medications.  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 Metastatic left periaortic adenopathy.  PET scan 02/06/2015 with extensive hypermetabolic lymphadenopathy in the chest, abdomen, and pelvis, Residual left pelvic tumor, sigmoid  colon lesion, liver/lung metastases, and a left acetabulum metastasis  Colonoscopy 02/09/2015 with a stricture at 30 cm, biopsy positive for adenocarcinoma, no intrinsic colon mass noted  Cycle 1 FOLFOX 02/14/2015  Cycle 2 FOLFOX 02/28/2015  CT 03/16/2015 with improvement in lung metastases, stable liver lesions and left pelvic soft tissue fullness, right ovarian vein thrombosis  Cycle 3 FOLFOX 03/26/2015 with Neulasta support  Cycle 4 FOLFOX 04/16/2015  Cycle 5 FOLFOX 05/07/2015 (oxaliplatin and 5-FU dose reduced, 5-FU bolus eliminated)  CTs chest, abdomen, and pelvis on 06/05/2015-mixed response of lung metastases, progression of hepatic metastases, decreased soft tissue in the left retroperitoneal space and left psoas, increased mixed lytic/sclerotic metastasis at the left acetabulum  Cycle 1 FOLFIRI/Avastin 06/13/2015  Cycle 2 FOLFIRI/Avastin 07/03/2015  Cycle 3 FOLFIRI/Avastin 07/17/2015  Cycle 4 FOLFIRI/Avastin 07/31/2015  Cycle 5 FOLFIRI/Avastin 08/14/2015  2. Placement of a left ureter stent 01/16/2015  3. Family history of multiple cancers, report of a paternal cousin-BRCA2 positive  4. Upper endoscopy 01/29/2015 with a localized thick gastric fold with a small ulcer  5. Mild swelling of the left foot and ankle 01/27/2015  6. Pain secondary to the left pelvic tumor and acetabulum metastasis-progressive acetabulum metastasis on the CT 06/05/2015  Palliative radiation, 1 fraction, 06/07/2015  7. Acute/delayed nausea following FOLFOX-Aloxi and amend were added with cycle 2, prophylactic Decadron with cycle 3  8. Acute transient loss of bladder control with cycle 1 FOLFOX  9. Severe neutropenia following cycle 2 FOLFOX. Neulasta added beginning with cycle 3.  10. Admission 04/26/2015 with severe mucositis secondary to chemotherapy  11. History of pancytopenia secondary chemotherapy-red cell transfusion 04/28/2015, platelet transfusion  04/28/2015  Disposition: [facility] Vasquez appears stable. She has  completed 4 cycles of FOLFIRI/Avastin. Plan to proceed with cycle 5 today as scheduled. The plan is for restaging CT scans prior to her next visit in 2 weeks.  She will return for a follow-up visit on 08/28/2015. She will contact the office in the interim with any problems.  Plan reviewed with Dr. Benay Spice.    Ned Card ANP/GNP-BC   08/14/2015  10:21 AM

## 2015-08-14 NOTE — Progress Notes (Signed)
OK TO TREAT TODAY WITH URINE PROTEIN AND PATIENT IS TO COLLECT A 24 HOUR URINE COLLECTION.

## 2015-08-14 NOTE — Telephone Encounter (Signed)
Gave patient avs report and appointments for March. Central radiology scheduling will contact patient re ct - patient aware.

## 2015-08-14 NOTE — Telephone Encounter (Signed)
As per Owens Shark call placed to Social work to contact Pt. In reference to financial questions. Spoke with Lauren who will leave message for Abby Potash to return call to Pt.

## 2015-08-14 NOTE — Patient Instructions (Signed)
[facility] Discharge Instructions for Patients Receiving Chemotherapy  Today you received the following chemotherapy agents Avastin, Irinotecan, Leucovorin and Fluorouracil.  To help prevent nausea and vomiting after your treatment, we encourage you to take your nausea medication as directed.  If you develop nausea and vomiting that is not controlled by your nausea medication, call the clinic.   BELOW ARE SYMPTOMS THAT SHOULD BE REPORTED IMMEDIATELY:  *FEVER GREATER THAN 100.5 F  *CHILLS WITH OR WITHOUT FEVER  NAUSEA AND VOMITING THAT IS NOT CONTROLLED WITH YOUR NAUSEA MEDICATION  *UNUSUAL SHORTNESS OF BREATH  *UNUSUAL BRUISING OR BLEEDING  TENDERNESS IN MOUTH AND THROAT WITH OR WITHOUT PRESENCE OF ULCERS  *URINARY PROBLEMS  *BOWEL PROBLEMS  UNUSUAL RASH Items with * indicate a potential emergency and should be followed up as soon as possible.  Feel free to call the clinic you have any questions or concerns. The clinic phone number is [phone].  Please show the CHEMO ALERT CARD at check-in to the Emergency Department and triage nurse.    

## 2015-08-15 LAB — CEA: CEA1: 238.6 ng/mL — AB (ref 0.0–4.7)

## 2015-08-16 ENCOUNTER — Ambulatory Visit (HOSPITAL_BASED_OUTPATIENT_CLINIC_OR_DEPARTMENT_OTHER): Payer: BLUE CROSS/BLUE SHIELD

## 2015-08-16 ENCOUNTER — Ambulatory Visit: Payer: BLUE CROSS/BLUE SHIELD

## 2015-08-16 ENCOUNTER — Other Ambulatory Visit: Payer: Self-pay | Admitting: Nurse Practitioner

## 2015-08-16 VITALS — BP 107/58 | HR 93 | Temp 97.3°F | Resp 18

## 2015-08-16 DIAGNOSIS — C5702 Malignant neoplasm of left fallopian tube: Secondary | ICD-10-CM

## 2015-08-16 DIAGNOSIS — Z5189 Encounter for other specified aftercare: Secondary | ICD-10-CM

## 2015-08-16 DIAGNOSIS — C7951 Secondary malignant neoplasm of bone: Secondary | ICD-10-CM | POA: Diagnosis not present

## 2015-08-16 DIAGNOSIS — C7800 Secondary malignant neoplasm of unspecified lung: Secondary | ICD-10-CM

## 2015-08-16 DIAGNOSIS — C801 Malignant (primary) neoplasm, unspecified: Secondary | ICD-10-CM

## 2015-08-16 DIAGNOSIS — R110 Nausea: Secondary | ICD-10-CM

## 2015-08-16 DIAGNOSIS — C562 Malignant neoplasm of left ovary: Secondary | ICD-10-CM

## 2015-08-16 MED ORDER — HEPARIN SOD (PORK) LOCK FLUSH 100 UNIT/ML IV SOLN
500.0000 [IU] | Freq: Once | INTRAVENOUS | Status: AC | PRN
  Administered 2015-08-16: 500 [IU]
  Filled 2015-08-16: qty 5

## 2015-08-16 MED ORDER — PROCHLORPERAZINE MALEATE 10 MG PO TABS
10.0000 mg | ORAL_TABLET | Freq: Four times a day (QID) | ORAL | Status: DC | PRN
  Administered 2015-08-16: 10 mg via ORAL

## 2015-08-16 MED ORDER — PROCHLORPERAZINE MALEATE 10 MG PO TABS
ORAL_TABLET | ORAL | Status: AC
  Filled 2015-08-16: qty 1

## 2015-08-16 MED ORDER — SODIUM CHLORIDE 0.9 % IV SOLN
Freq: Once | INTRAVENOUS | Status: AC
  Administered 2015-08-16: 12:00:00 via INTRAVENOUS

## 2015-08-16 MED ORDER — SODIUM CHLORIDE 0.9 % IV SOLN: Freq: Once | INTRAVENOUS | Status: DC

## 2015-08-16 MED ORDER — PEGFILGRASTIM INJECTION 6 MG/0.6ML ~~LOC~~
6.0000 mg | PREFILLED_SYRINGE | Freq: Once | SUBCUTANEOUS | Status: AC
  Administered 2015-08-16: 6 mg via SUBCUTANEOUS
  Filled 2015-08-16: qty 0.6

## 2015-08-16 MED ORDER — SODIUM CHLORIDE 0.9 % IJ SOLN
10.0000 mL | INTRAMUSCULAR | Status: DC | PRN
  Administered 2015-08-16: 10 mL
  Filled 2015-08-16: qty 10

## 2015-08-16 MED ORDER — ONDANSETRON HCL 8 MG PO TABS
ORAL_TABLET | ORAL | Status: AC
  Filled 2015-08-16: qty 1

## 2015-08-16 NOTE — Progress Notes (Signed)
Patient complains of nausea. Dr. Benay Spice notified, order given and carried out for 10 mg compazine PO.

## 2015-08-16 NOTE — Patient Instructions (Signed)

## 2015-08-17 LAB — PROTEIN, URINE, 24 HOUR
PROTEIN 24H UR: 330.4 mg/(24.h) — AB (ref 30.0–150.0)
PROTEIN,TOTAL,URINE: 41.3 mg/dL

## 2015-08-24 ENCOUNTER — Encounter (HOSPITAL_COMMUNITY): Payer: Self-pay

## 2015-08-24 ENCOUNTER — Ambulatory Visit (HOSPITAL_COMMUNITY)
Admission: RE | Admit: 2015-08-24 | Discharge: 2015-08-24 | Disposition: A | Discharge status: Home / Self Care | Payer: BLUE CROSS/BLUE SHIELD | Source: Ambulatory Visit | Attending: Nurse Practitioner | Admitting: Nurse Practitioner

## 2015-08-24 DIAGNOSIS — C562 Malignant neoplasm of left ovary: Secondary | ICD-10-CM | POA: Diagnosis present

## 2015-08-24 DIAGNOSIS — K769 Liver disease, unspecified: Secondary | ICD-10-CM | POA: Insufficient documentation

## 2015-08-24 DIAGNOSIS — M899 Disorder of bone, unspecified: Secondary | ICD-10-CM | POA: Insufficient documentation

## 2015-08-24 DIAGNOSIS — R918 Other nonspecific abnormal finding of lung field: Secondary | ICD-10-CM | POA: Diagnosis not present

## 2015-08-24 DIAGNOSIS — R1900 Intra-abdominal and pelvic swelling, mass and lump, unspecified site: Secondary | ICD-10-CM | POA: Insufficient documentation

## 2015-08-24 DIAGNOSIS — C7989 Secondary malignant neoplasm of other specified sites: Secondary | ICD-10-CM | POA: Diagnosis not present

## 2015-08-24 DIAGNOSIS — Z9221 Personal history of antineoplastic chemotherapy: Secondary | ICD-10-CM | POA: Diagnosis not present

## 2015-08-24 MED ORDER — IOHEXOL 300 MG/ML  SOLN
100.0000 mL | Freq: Once | INTRAMUSCULAR | Status: AC | PRN
  Administered 2015-08-24: 100 mL via INTRAVENOUS

## 2015-08-28 ENCOUNTER — Other Ambulatory Visit (HOSPITAL_BASED_OUTPATIENT_CLINIC_OR_DEPARTMENT_OTHER): Payer: BLUE CROSS/BLUE SHIELD

## 2015-08-28 ENCOUNTER — Ambulatory Visit: Payer: BLUE CROSS/BLUE SHIELD

## 2015-08-28 ENCOUNTER — Telehealth: Payer: Self-pay | Admitting: Oncology

## 2015-08-28 ENCOUNTER — Ambulatory Visit (HOSPITAL_BASED_OUTPATIENT_CLINIC_OR_DEPARTMENT_OTHER): Payer: BLUE CROSS/BLUE SHIELD | Admitting: Oncology

## 2015-08-28 VITALS — BP 106/60 | HR 110 | Temp 98.3°F | Resp 18 | Ht 65.0 in | Wt 130.2 lb

## 2015-08-28 DIAGNOSIS — C5702 Malignant neoplasm of left fallopian tube: Secondary | ICD-10-CM

## 2015-08-28 DIAGNOSIS — Z809 Family history of malignant neoplasm, unspecified: Secondary | ICD-10-CM

## 2015-08-28 DIAGNOSIS — C7800 Secondary malignant neoplasm of unspecified lung: Secondary | ICD-10-CM | POA: Diagnosis not present

## 2015-08-28 DIAGNOSIS — C787 Secondary malignant neoplasm of liver and intrahepatic bile duct: Secondary | ICD-10-CM | POA: Diagnosis not present

## 2015-08-28 DIAGNOSIS — C562 Malignant neoplasm of left ovary: Secondary | ICD-10-CM | POA: Diagnosis not present

## 2015-08-28 DIAGNOSIS — K5900 Constipation, unspecified: Secondary | ICD-10-CM

## 2015-08-28 DIAGNOSIS — G8918 Other acute postprocedural pain: Secondary | ICD-10-CM

## 2015-08-28 DIAGNOSIS — C7951 Secondary malignant neoplasm of bone: Secondary | ICD-10-CM

## 2015-08-28 DIAGNOSIS — C801 Malignant (primary) neoplasm, unspecified: Secondary | ICD-10-CM

## 2015-08-28 LAB — COMPREHENSIVE METABOLIC PANEL
ALBUMIN: 2.9 g/dL — AB (ref 3.5–5.0)
ALK PHOS: 283 U/L — AB (ref 40–150)
ALT: 21 U/L (ref 0–55)
AST: 30 U/L (ref 5–34)
Anion Gap: 6 mEq/L (ref 3–11)
BUN: 7.1 mg/dL (ref 7.0–26.0)
CALCIUM: 9.1 mg/dL (ref 8.4–10.4)
CHLORIDE: 109 meq/L (ref 98–109)
CO2: 26 mEq/L (ref 22–29)
Creatinine: 0.7 mg/dL (ref 0.6–1.1)
Glucose: 98 mg/dl (ref 70–140)
POTASSIUM: 3.8 meq/L (ref 3.5–5.1)
SODIUM: 141 meq/L (ref 136–145)
Total Bilirubin: 0.35 mg/dL (ref 0.20–1.20)
Total Protein: 6.6 g/dL (ref 6.4–8.3)

## 2015-08-28 LAB — CBC WITH DIFFERENTIAL/PLATELET
BASO%: 1.1 % (ref 0.0–2.0)
BASOS ABS: 0.1 10*3/uL (ref 0.0–0.1)
EOS ABS: 0.3 10*3/uL (ref 0.0–0.5)
EOS%: 3 % (ref 0.0–7.0)
HEMATOCRIT: 34 % — AB (ref 34.8–46.6)
HEMOGLOBIN: 10.7 g/dL — AB (ref 11.6–15.9)
LYMPH#: 1.6 10*3/uL (ref 0.9–3.3)
LYMPH%: 17 % (ref 14.0–49.7)
MCH: 30 pg (ref 25.1–34.0)
MCHC: 31.5 g/dL (ref 31.5–36.0)
MCV: 95.2 fL (ref 79.5–101.0)
MONO#: 1.2 10*3/uL — AB (ref 0.1–0.9)
MONO%: 12.5 % (ref 0.0–14.0)
NEUT#: 6.3 10*3/uL (ref 1.5–6.5)
NEUT%: 66.4 % (ref 38.4–76.8)
PLATELETS: 253 10*3/uL (ref 145–400)
RBC: 3.57 10*6/uL — AB (ref 3.70–5.45)
RDW: 15.7 % — AB (ref 11.2–14.5)
WBC: 9.4 10*3/uL (ref 3.9–10.3)

## 2015-08-28 LAB — UA PROTEIN, DIPSTICK - CHCC: PROTEIN: 100 mg/dL

## 2015-08-28 MED ORDER — OXYCODONE HCL 5 MG PO TABS: ORAL_TABLET | ORAL | Status: DC

## 2015-08-28 MED ORDER — ALPRAZOLAM 0.5 MG PO TABS
0.5000 mg | ORAL_TABLET | Freq: Three times a day (TID) | ORAL | Status: DC | PRN
Start: 2015-08-28 — End: 2015-09-17

## 2015-08-28 NOTE — Progress Notes (Signed)
Brianna Vasquez   Diagnosis: Ovarian cancer  INTERVAL HISTORY:   Brianna Vasquez returns as scheduled. She completed another cycle of FOLFIRI/Avastin 08/14/2015. She reports tolerating the chemotherapy well. No nausea/vomiting, mouth sores, or diarrhea. She is constipated. The pain has improved patient now takes oxycodone every 4 hours during the day. She is not taking pain medications Route the night. Her appetite has improved. She reports malaise following chemotherapy.  Objective:  Vital signs in last 24 hours:  Blood pressure 106/60, pulse 110, temperature 98.3 F (36.8 C), temperature source Oral, resp. rate 18, height 5\' 5"  (1.651 m), weight 130 lb 3.2 oz (59.058 kg), SpO2 100 %.    HEENT: No thrush or ulcers Lymphatics: No cervical, supraclavicular, axillary, or inguinal nodes Resp: Lungs clear bilaterally Cardio: Regular rate and rhythm GI: No hepatosplenomegaly, no mass, nontender Vascular: No leg edema  Skin: 1/2 cm cutaneous nodule at the left upper anterior chest   Portacath/PICC-without erythema  Lab Results:  Lab Results  Component Value Date   WBC 9.4 08/28/2015   HGB 10.7* 08/28/2015   HCT 34.0* 08/28/2015   MCV 95.2 08/28/2015   PLT 253 08/28/2015   NEUTROABS 6.3 08/28/2015      Lab Results  Component Value Date   CEA1 238.6* 08/14/2015    Imaging:CTs of the chest, abdomen, and pelvis on 08/24/2015-several pulmonary nodules and hepatic metastases are smaller, but others are larger. The soft tissue mass at the left pelvis has enlarged.  I reviewed the CT images with Brianna Vasquez and her family.  Medications: I have reviewed the patient's current medications.  Assessment/Plan: 1. 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 Metastatic left periaortic adenopathy.  PET scan 02/06/2015 with extensive hypermetabolic lymphadenopathy in the chest, abdomen, and pelvis, Residual left pelvic tumor, sigmoid colon lesion, liver/lung metastases, and a left acetabulum metastasis  Colonoscopy 02/09/2015 with a stricture at 30 cm, biopsy positive for adenocarcinoma, no intrinsic colon mass noted  Cycle 1 FOLFOX 02/14/2015  Cycle 2 FOLFOX 02/28/2015  CT 03/16/2015 with improvement in lung metastases, stable liver lesions and left pelvic soft tissue fullness, right ovarian vein thrombosis  Cycle 3 FOLFOX 03/26/2015 with Neulasta support  Cycle 4 FOLFOX 04/16/2015  Cycle 5 FOLFOX 05/07/2015 (oxaliplatin and 5-FU dose reduced, 5-FU bolus eliminated)  CTs chest, abdomen, and pelvis on 06/05/2015-mixed response of lung metastases, progression of hepatic metastases, decreased soft tissue in the left retroperitoneal space and left psoas, increased mixed lytic/sclerotic metastasis at the left acetabulum  Cycle 1 FOLFIRI/Avastin 06/13/2015  Cycle 2 FOLFIRI/Avastin 07/03/2015  Cycle 3 FOLFIRI/Avastin 07/17/2015  Cycle 4 FOLFIRI/Avastin 07/31/2015  Cycle 5 FOLFIRI/Avastin 08/14/2015  CTs 08/24/2015-mixed response with enlargement of several liver lesions and lung nodules, others are smaller, pelvic mass is larger  2. Placement of a left ureter stent 01/16/2015  3. Family history of multiple cancers, report of a paternal cousin-BRCA2 positive  4. Upper endoscopy 01/29/2015 with a localized thick gastric fold with a small ulcer  5. Mild swelling of the left foot and ankle 01/27/2015  6. Pain secondary to the left pelvic tumor and acetabulum metastasis-progressive acetabulum metastasis on the CT 06/05/2015  Palliative radiation, 1 fraction, 06/07/2015  7.  Acute/delayed nausea  following FOLFOX-Aloxi and amend were added with cycle 2, prophylactic Decadron with cycle 3  8. Acute transient loss of bladder control with cycle 1 FOLFOX  9. Severe neutropenia following cycle 2 FOLFOX. Neulasta added beginning with cycle 3.  10. Admission 04/26/2015 with severe mucositis secondary to chemotherapy  11. History of pancytopenia secondary chemotherapy-red cell transfusion 04/28/2015, platelet transfusion 04/28/2015    Disposition:  Brianna Vasquez has completed 5 cycles of FOLFIRI/Avastin. Her pain is improved and the CEA is stable. She experiences significant malaise following chemotherapy. The restaging CT reveals a mixed response. I reviewed the CT images with Brianna Vasquez and her family. The overall pattern is consistent with "stable "disease. We discussed continuing FOLFIRI/Avastin, switching to a different salvage chemotherapy regimen, or referring her to consider a clinical trial. She does not wish to continue FOLFIRI/Avastin. We will refer her back to GYN oncology to consider salvage treatment options. She may be a candidate for the [place] study at [facility].  Brianna Vasquez will return for an office visit in 2 weeks.  Betsy Coder, MD  08/28/2015  11:18 AM

## 2015-08-28 NOTE — Telephone Encounter (Signed)
appt made per 3/21 pof.Appt with Dr Gaynelle Arabian at [facility] May 1st at 2:15 pm. Pt will be placed on a wait list for cancellations. Message sent to Dr Denman George scheduler for appt date/time

## 2015-08-29 ENCOUNTER — Telehealth: Payer: Self-pay | Admitting: *Deleted

## 2015-08-29 ENCOUNTER — Telehealth: Payer: Self-pay | Admitting: Oncology

## 2015-08-29 LAB — CEA: CEA1: 195.1 ng/mL — AB (ref 0.0–4.7)

## 2015-08-29 NOTE — Telephone Encounter (Signed)
Faxed over most recent notes for new pt referral to [facility] [phone] release id)

## 2015-08-29 NOTE — Telephone Encounter (Signed)
Confirmed with pt she does not need IV fluids this week. Chemo was held 3/21, she is eating and drinking without difficulty. Appts canceled.

## 2015-08-30 ENCOUNTER — Encounter: Payer: BLUE CROSS/BLUE SHIELD | Admitting: Nutrition

## 2015-08-30 ENCOUNTER — Telehealth: Payer: Self-pay | Admitting: *Deleted

## 2015-08-30 ENCOUNTER — Ambulatory Visit: Payer: BLUE CROSS/BLUE SHIELD

## 2015-08-30 NOTE — Telephone Encounter (Signed)
-----   Message from Ladell Pier, MD sent at 08/30/2015 12:04 PM EDT ----- Please call patient, cea is better

## 2015-08-30 NOTE — Telephone Encounter (Signed)
Pt notified of CEA result. She voiced appreciation for call. 

## 2015-09-03 ENCOUNTER — Telehealth: Payer: Self-pay | Admitting: Nurse Practitioner

## 2015-09-03 ENCOUNTER — Telehealth: Payer: Self-pay

## 2015-09-03 ENCOUNTER — Encounter: Payer: Self-pay | Admitting: *Deleted

## 2015-09-03 DIAGNOSIS — C801 Malignant (primary) neoplasm, unspecified: Secondary | ICD-10-CM

## 2015-09-03 DIAGNOSIS — R530 Neoplastic (malignant) related fatigue: Secondary | ICD-10-CM

## 2015-09-03 DIAGNOSIS — C562 Malignant neoplasm of left ovary: Secondary | ICD-10-CM

## 2015-09-03 NOTE — Telephone Encounter (Signed)
Patient called back and has decided not to go to the ED as advised by Dr. Benay Spice.  She would like to know if Dr. Benay Spice would like to see her after she see's Dr. Hartley Barefoot tomorrow.  She is requesting a call back from [place], [place]

## 2015-09-03 NOTE — Telephone Encounter (Signed)
cld & spoke to pt and gave pt time date of appt for 3/28

## 2015-09-03 NOTE — Progress Notes (Signed)
Call received from pt at 3:59PM  stating that she would like to see if Dr. Benay Spice would be available to see pt on 09/04/15 d/t her increased complaints of chest pain.  Pt states that her chest pain has "gotten worse since Saturday" and that "my shortness of breath has also gotten worse since Saturday"   Pt states that she is not diaphoretic and feels like her "pulse seems more elevated."  Pt instructed to go to the emergency room now per Dr. Benay Spice.  Pt states that she does not feel as though she needs to go to the ER at this time and that she does not have a ride at this time, but she will call her sister to see if she is able to take her.  Pt told to call us back and let us know of her plans.  Pt is refusing to go to the ER.  Dr. Benay Spice notified.  Pt encouraged to call 911 if chest pain worsens over night.  She verbalizes an understanding to do so.  Appt made for pt to be seen in [facility] tomorrow-09/04/15 at 10:00AM.

## 2015-09-04 ENCOUNTER — Other Ambulatory Visit: Payer: Self-pay

## 2015-09-04 ENCOUNTER — Encounter (HOSPITAL_COMMUNITY): Payer: Self-pay

## 2015-09-04 ENCOUNTER — Ambulatory Visit (HOSPITAL_BASED_OUTPATIENT_CLINIC_OR_DEPARTMENT_OTHER): Payer: BLUE CROSS/BLUE SHIELD | Admitting: Nurse Practitioner

## 2015-09-04 ENCOUNTER — Other Ambulatory Visit (HOSPITAL_BASED_OUTPATIENT_CLINIC_OR_DEPARTMENT_OTHER): Payer: BLUE CROSS/BLUE SHIELD

## 2015-09-04 ENCOUNTER — Encounter: Payer: Self-pay | Admitting: Nurse Practitioner

## 2015-09-04 ENCOUNTER — Ambulatory Visit (HOSPITAL_COMMUNITY)
Admission: RE | Admit: 2015-09-04 | Discharge: 2015-09-04 | Disposition: A | Discharge status: Home / Self Care | Payer: BLUE CROSS/BLUE SHIELD | Source: Ambulatory Visit | Attending: Nurse Practitioner | Admitting: Nurse Practitioner

## 2015-09-04 VITALS — BP 127/63 | HR 103 | Temp 97.7°F | Resp 20 | Ht 65.0 in | Wt 126.6 lb

## 2015-09-04 DIAGNOSIS — R938 Abnormal findings on diagnostic imaging of other specified body structures: Secondary | ICD-10-CM | POA: Insufficient documentation

## 2015-09-04 DIAGNOSIS — C801 Malignant (primary) neoplasm, unspecified: Secondary | ICD-10-CM

## 2015-09-04 DIAGNOSIS — C7800 Secondary malignant neoplasm of unspecified lung: Secondary | ICD-10-CM | POA: Diagnosis not present

## 2015-09-04 DIAGNOSIS — C7951 Secondary malignant neoplasm of bone: Secondary | ICD-10-CM

## 2015-09-04 DIAGNOSIS — R000 Tachycardia, unspecified: Secondary | ICD-10-CM | POA: Diagnosis present

## 2015-09-04 DIAGNOSIS — R0602 Shortness of breath: Secondary | ICD-10-CM | POA: Diagnosis present

## 2015-09-04 DIAGNOSIS — E860 Dehydration: Secondary | ICD-10-CM

## 2015-09-04 DIAGNOSIS — C787 Secondary malignant neoplasm of liver and intrahepatic bile duct: Secondary | ICD-10-CM

## 2015-09-04 DIAGNOSIS — G8918 Other acute postprocedural pain: Secondary | ICD-10-CM

## 2015-09-04 DIAGNOSIS — R0789 Other chest pain: Secondary | ICD-10-CM | POA: Diagnosis not present

## 2015-09-04 DIAGNOSIS — C562 Malignant neoplasm of left ovary: Secondary | ICD-10-CM

## 2015-09-04 DIAGNOSIS — R530 Neoplastic (malignant) related fatigue: Secondary | ICD-10-CM

## 2015-09-04 LAB — CBC & DIFF AND RETIC
BASO%: 0.8 % (ref 0.0–2.0)
Basophils Absolute: 0.1 10*3/uL (ref 0.0–0.1)
EOS ABS: 0.3 10*3/uL (ref 0.0–0.5)
EOS%: 2.7 % (ref 0.0–7.0)
HEMATOCRIT: 36.2 % (ref 34.8–46.6)
HEMOGLOBIN: 11.5 g/dL — AB (ref 11.6–15.9)
IMMATURE RETIC FRACT: 15.8 % — AB (ref 1.60–10.00)
LYMPH%: 16 % (ref 14.0–49.7)
MCH: 29.9 pg (ref 25.1–34.0)
MCHC: 31.8 g/dL (ref 31.5–36.0)
MCV: 94.3 fL (ref 79.5–101.0)
MONO#: 1.6 10*3/uL — ABNORMAL HIGH (ref 0.1–0.9)
MONO%: 13.1 % (ref 0.0–14.0)
NEUT%: 67.4 % (ref 38.4–76.8)
NEUTROS ABS: 8.1 10*3/uL — AB (ref 1.5–6.5)
PLATELETS: 321 10*3/uL (ref 145–400)
RBC: 3.84 10*6/uL (ref 3.70–5.45)
RDW: 16.3 % — ABNORMAL HIGH (ref 11.2–14.5)
Retic %: 1.39 % (ref 0.70–2.10)
Retic Ct Abs: 53.38 10*3/uL (ref 33.70–90.70)
WBC: 12.1 10*3/uL — AB (ref 3.9–10.3)
lymph#: 1.9 10*3/uL (ref 0.9–3.3)
nRBC: 0 % (ref 0–0)

## 2015-09-04 LAB — COMPREHENSIVE METABOLIC PANEL
ALK PHOS: 494 U/L — AB (ref 40–150)
ALT: 30 U/L (ref 0–55)
ANION GAP: 11 meq/L (ref 3–11)
AST: 45 U/L — ABNORMAL HIGH (ref 5–34)
Albumin: 3.3 g/dL — ABNORMAL LOW (ref 3.5–5.0)
BILIRUBIN TOTAL: 1.05 mg/dL (ref 0.20–1.20)
BUN: 7 mg/dL (ref 7.0–26.0)
CALCIUM: 10.2 mg/dL (ref 8.4–10.4)
CO2: 27 mEq/L (ref 22–29)
CREATININE: 0.8 mg/dL (ref 0.6–1.1)
Chloride: 103 mEq/L (ref 98–109)
EGFR: 85 mL/min/{1.73_m2} — AB (ref >90)
Glucose: 107 mg/dl (ref 70–140)
Potassium: 3.7 mEq/L (ref 3.5–5.1)
Sodium: 140 mEq/L (ref 136–145)
TOTAL PROTEIN: 7.8 g/dL (ref 6.4–8.3)

## 2015-09-04 MED ORDER — SODIUM CHLORIDE 0.9 % IV SOLN
Freq: Once | INTRAVENOUS | Status: AC
  Administered 2015-09-04: 13:00:00 via INTRAVENOUS

## 2015-09-04 MED ORDER — HEPARIN SOD (PORK) LOCK FLUSH 100 UNIT/ML IV SOLN
500.0000 [IU] | Freq: Once | INTRAVENOUS | Status: AC
  Administered 2015-09-04: 500 [IU] via INTRAVENOUS
  Filled 2015-09-04: qty 5

## 2015-09-04 MED ORDER — OXYCODONE HCL 5 MG PO TABS: ORAL_TABLET | ORAL | Status: DC

## 2015-09-04 MED ORDER — MORPHINE SULFATE (PF) 4 MG/ML IV SOLN
INTRAVENOUS | Status: AC
  Filled 2015-09-04: qty 1

## 2015-09-04 MED ORDER — IOHEXOL 350 MG/ML SOLN
100.0000 mL | Freq: Once | INTRAVENOUS | Status: AC | PRN
  Administered 2015-09-04: 100 mL via INTRAVENOUS

## 2015-09-04 MED ORDER — SODIUM CHLORIDE 0.9% FLUSH
10.0000 mL | INTRAVENOUS | Status: DC | PRN
  Administered 2015-09-04: 10 mL via INTRAVENOUS
  Filled 2015-09-04: qty 10

## 2015-09-04 MED ORDER — MORPHINE SULFATE 4 MG/ML IJ SOLN
2.0000 mg | Freq: Once | INTRAMUSCULAR | Status: AC
  Administered 2015-09-04: 2 mg via INTRAVENOUS
  Filled 2015-09-04: qty 1

## 2015-09-04 NOTE — Assessment & Plan Note (Signed)
Patient denies any nausea, vomiting, or diarrhea; but does admit to decreased appetite.  She feels that she may be slightly dehydrated today.  Exam today revealed vital signs stable; with the exception of a tachycardia with a rate of 125 on initial check.  Patient received IV fluid rehydration while at the cancer Center today.  Heart rate was decreased to 103 on recheck.  Patient was advised to push fluids at home as well.

## 2015-09-04 NOTE — Progress Notes (Signed)
SYMPTOM MANAGEMENT CLINIC   HPI: Brianna Vasquez 55 y.o. female diagnosed with ovarian cancer with lung, liver, and bone metastasis.  Patient completed her last Folfiri/Avastin chemotherapy on 08/14/2015.  Currently undergoing observation only.   Patient states that she developed increased left chest wall discomfort over this past weekend.  She denies any known injury or trauma to the area.  Patient states that she has increased pain to the left upper chest area with any movement and with deep inspiration.  She occasionally feel short of breath as well.  She states the pain is non--radiating.  Exam today reveals breath sounds clear bilaterally; with no cough or wheezes.  She has no respiratory distress whatsoever.  Tachycardic rate at 125 on initial check; which then decreased to 103 after receiving IV fluids.  Patient also states that she has been taking only one half of an oxycodone tablet; that has been occasionally increasing the oxycodone to 2 tablets.  Patient was given morphine IV while at the [place]; and then also took one of her oxycodone tablets as well.  CT angiogram of the chest with contrast obtained today was negative for pulmonary embolism.  Also, there was no further progression noted on the scan.  EKG obtained today revealed a sinus tach with a heart rate of 105.  Discussed with patient the possibility of being transferred to the emergency department for further evaluation and management; the patient declined.  Reviewed all findings in detail with Dr. Lynnae Sandhoff agreed that patient's chest wall discomfort may very well be secondary to her cancer diagnosis and questionable unrecognized bone metastasis.  Advised patient to go directly to the emergency department for any worsening symptoms whatsoever.  HPI  Review of Systems  Constitutional: Positive for malaise/fatigue.  Respiratory: Positive for shortness of breath. Negative for cough, hemoptysis, sputum  production and wheezing.   Cardiovascular: Positive for chest pain. Negative for palpitations, orthopnea, claudication, leg swelling and PND.  All other systems reviewed and are negative.   Past Medical History  Diagnosis Date  . Wears glasses   . Diverticulosis     dx 09/2014  . Esophagus hernia     hx of  . GERD (gastroesophageal reflux disease)     once in a while  . Anemia     hx of, none since hysterectomy  . Headache     severe, for last few mornings  . Anxiety     recent, situational  . Cancer ([place])     "ovary tumor-on colon, kidney, spots on liver and lungs"    Past Surgical History  Procedure Laterality Date  . Wisdom tooth extraction  ~2001  . Cholecystectomy  2007    lap choli  . Breast lumpectomy Right 10/2013  . Vaginal hysterectomy  2005  . Colonoscopy  2016  . Laparotomy N/A 01/16/2015    Procedure: EXPLORATORY LAPAROTOMY ;  Surgeon: Nancy Marus, MD;  Location: [facility];  Service: Gynecology;  Laterality: N/A;  . Oophorectomy Bilateral 01/16/2015    Procedure: BILATERAL OOPHORECTOMY RESECTION OF PELVIC MASS ;  Surgeon: Nancy Marus, MD;  Location: [facility];  Service: Gynecology;  Laterality: Bilateral;  . Cystoscopy w/ ureteral stent placement Left 01/16/2015    Procedure: CYSTOSCOPY WITH RETROGRADE PYELOGRAM/URETERAL STENT PLACEMENT;  Surgeon: Carolan Clines, MD;  Location: [facility];  Service: Urology;  Laterality: Left;  . Porta cath insertion    . 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    is allergic to other; ativan; and darvon.    Medication List       This list is accurate as of: 09/04/15  3:50 PM.  Always use your most recent med list.               ALPRAZolam 0.5 MG tablet  Commonly known as:  XANAX  Take 1 tablet (0.5 mg total) by mouth every 8 (eight) hours as needed. for anxiety     B COMPLEX PO  Take 1 tablet by mouth daily. Reported on 07/13/2015     calcium carbonate 500 MG chewable tablet  Commonly known as:  TUMS - dosed in mg elemental calcium  Chew 1-2 tablets by mouth 3 (three) times daily as needed for indigestion or heartburn.     dexamethasone 0.5 MG/5ML elixir  Take 5 mls PO QID PRN mucositis.     ECHINACEA PO  Take 1 tablet by mouth daily as needed.     eszopiclone 2 MG Tabs tablet  Commonly known as:  LUNESTA  Take 1 tablet (2 mg total) by mouth at bedtime as needed for sleep. Take immediately before bedtime     lidocaine-prilocaine cream  Commonly known as:  EMLA  Apply to port site one hour prior to use. Do not rub in. Cover with plastic.     loperamide 1 MG/5ML solution  Commonly known as:  IMODIUM  Take 10 mLs (2 mg total) by mouth as needed for diarrhea or loose stools.     magic mouthwash w/lidocaine Soln  Take 5 mLs by mouth 4 (four) times daily as needed for mouth pain ([employer] formula w/ nystatin and lidocaine 1:1.).     ondansetron 4 MG disintegrating tablet  Commonly known as:  ZOFRAN ODT  Take 1 tablet (4 mg total) by mouth every 8 (eight) hours as needed for nausea or vomiting.     ondansetron 8 MG tablet  Commonly known as:  ZOFRAN  Take 1 tablet (8 mg total) by mouth every 8 (eight) hours.     oxyCODONE 5 MG immediate release tablet  Commonly known as:  Oxy IR/ROXICODONE  Take 1-2 tabs every 4 hours as needed for pain     POTASSIUM PO  Take 1 tablet by mouth daily.     PRESCRIPTION MEDICATION  Chemo at [place]  through pump for 2 days at home and gets pump connected after Chemo IV at [facility] and then comes back to get it disconnected after the 2 days     prochlorperazine 10 MG tablet  Commonly known as:  COMPAZINE  Take 1 tablet (10 mg total) by mouth every 6 (six) hours as needed for nausea.     sucralfate 1 GM/10ML suspension  Commonly known as:  CARAFATE  Take 10 mLs (1 g total) by mouth 4 (four) times daily as needed.     vitamin C 1000 MG tablet  Take 1,000 mg by mouth daily.         PHYSICAL EXAMINATION  Oncology Vitals 09/04/2015 09/04/2015  Height - 165  cm  Weight - 57.425 kg  Weight (lbs) - 126 lbs 10 oz  BMI (kg/m2) - 21.07 kg/m2  Temp - 97.7  Pulse 103 125  Resp - 20  SpO2 - 99  BSA (m2) - 1.62 m2   BP Readings from Last 2 Encounters:  09/04/15 127/63  08/28/15 106/60    Physical Exam  Constitutional: She is oriented to person, place, and time. She appears malnourished and dehydrated. She appears unhealthy. She appears cachectic.  HENT:  Head: Normocephalic and atraumatic.  Mouth/Throat: Oropharynx is clear and moist.  Eyes: Conjunctivae and EOM are normal. Pupils are equal, round, and reactive to light. Right eye exhibits no discharge. Left eye exhibits no discharge. No scleral icterus.  Neck: Normal range of motion. Neck supple. No JVD present. No tracheal deviation present. No thyromegaly present.  Cardiovascular: Normal heart sounds and intact distal pulses.   Tachycardia.  Pulmonary/Chest: Effort normal and breath sounds normal. No stridor. No respiratory distress. She has no wheezes. She has no rales. She exhibits no tenderness.  Abdominal: Soft. Bowel sounds are normal. She exhibits no distension and no mass. There is no tenderness. There is no rebound and no guarding.  Musculoskeletal: Normal range of motion. She exhibits no edema or tenderness.  Lymphadenopathy:    She has no cervical adenopathy.  Neurological: She is alert and oriented to person, place, and  time. Gait normal.  Skin: Skin is warm and dry. No rash noted. No erythema. There is pallor.  Psychiatric: Affect normal.    LABORATORY DATA:. Appointment on 09/04/2015  Component Date Value Ref Range Status  . WBC 09/04/2015 12.1* 3.9 - 10.3 10e3/uL Final  . NEUT# 09/04/2015 8.1* 1.5 - 6.5 10e3/uL Final  . HGB 09/04/2015 11.5* 11.6 - 15.9 g/dL Final  . HCT 09/04/2015 36.2  34.8 - 46.6 % Final  . Platelets 09/04/2015 321  145 - 400 10e3/uL Final  . MCV 09/04/2015 94.3  79.5 - 101.0 fL Final  . MCH 09/04/2015 29.9  25.1 - 34.0 pg Final  . MCHC 09/04/2015 31.8  31.5 - 36.0 g/dL Final  . RBC 09/04/2015 3.84  3.70 - 5.45 10e6/uL Final  . RDW 09/04/2015 16.3* 11.2 - 14.5 % Final  . lymph# 09/04/2015 1.9  0.9 - 3.3 10e3/uL Final  . MONO# 09/04/2015 1.6* 0.1 - 0.9 10e3/uL Final  . Eosinophils Absolute 09/04/2015 0.3  0.0 - 0.5 10e3/uL Final  . Basophils Absolute 09/04/2015 0.1  0.0 - 0.1 10e3/uL Final  . NEUT% 09/04/2015 67.4  38.4 - 76.8 % Final  . LYMPH% 09/04/2015 16.0  14.0 - 49.7 % Final  . MONO% 09/04/2015 13.1  0.0 - 14.0 % Final  . EOS% 09/04/2015 2.7  0.0 - 7.0 % Final  . BASO% 09/04/2015 0.8  0.0 - 2.0 % Final  . nRBC 09/04/2015 0  0 - 0 % Final  . Retic % 09/04/2015 1.39  0.70 - 2.10 % Final  . Retic Ct Abs 09/04/2015 53.38  33.70 - 90.70 10e3/uL Final  . Immature Retic Fract 09/04/2015 15.80* 1.60 - 10.00 % Final  . Sodium 09/04/2015 140  136 - 145 mEq/L Final  . Potassium 09/04/2015 3.7  3.5 - 5.1 mEq/L Final  . Chloride 09/04/2015 103  98 - 109 mEq/L Final  . CO2 09/04/2015 27  22 - 29 mEq/L Final  . Glucose 09/04/2015 107  70 - 140 mg/dl Final   Glucose reference range is for nonfasting patients. Fasting glucose reference range is 70- 100.  Marland Kitchen  BUN 09/04/2015 7.0  7.0 - 26.0 mg/dL Final  . Creatinine 09/04/2015 0.8  0.6 - 1.1 mg/dL Final  . Total Bilirubin 09/04/2015 1.05  0.20 - 1.20 mg/dL Final  . Alkaline Phosphatase 09/04/2015 494* 40 - 150 U/L Final  . AST  09/04/2015 45* 5 - 34 U/L Final  . ALT 09/04/2015 30  0 - 55 U/L Final  . Total Protein 09/04/2015 7.8  6.4 - 8.3 g/dL Final  . Albumin 09/04/2015 3.3* 3.5 - 5.0 g/dL Final  . Calcium 09/04/2015 10.2  8.4 - 10.4 mg/dL Final  . Anion Gap 09/04/2015 11  3 - 11 mEq/L Final  . EGFR 09/04/2015 85* >90 ml/min/1.73 m2 Final   eGFR is calculated using the CKD-EPI Creatinine Equation (2009)   EKG: CIANI, RUTTEN VQ:[id number] 04-Sep-2015 13:33:18 [facility] ROUTINE RECORD Sinus tachycardia Otherwise normal ECG 94m/s 156mmV '100Hz'$  8.0 SP2 12SL 237 CID: [id number] Referred by: CINDY BACON Unconfirmed Vent. rate 105 BPM PR interval 130 ms QRS duration 88 ms QT/QTc 352/465 ms P-R-T axes 68 49 41 [id number] yr) Female Caucasian Room: Loc:511 Technician: MCNAIRY  RADIOGRAPHIC STUDIES: Ct Angio Chest Pe W/cm &/or Wo Cm  09/04/2015  CLINICAL DATA:  History of ovarian carcinoma with lung metastatic disease and shortness of breath. EXAM: CT ANGIOGRAPHY CHEST WITH CONTRAST TECHNIQUE: Multidetector CT imaging of the chest was performed using the standard protocol during bolus administration of intravenous contrast. Multiplanar CT image reconstructions and MIPs were obtained to evaluate the vascular anatomy. CONTRAST:  10026mMNIPAQUE IOHEXOL 350 MG/ML SOLN COMPARISON:  08/24/2015 FINDINGS: Lungs are well aerated bilaterally with patchy spiculated lesions consistent with metastatic disease in the recent exam. No acute infiltrate or sizable effusion is seen. The thoracic inlet is within normal limits. The thoracic aorta is unremarkable. No dissection or aneurysmal dilatation is seen. Pulmonary artery is well visualized and shows a normal branching pattern. No filling defects to suggest pulmonary emboli are identified. The visualized upper abdomen demonstrates multiple hepatic metastatic lesions similar to that seen on the prior exam. Review of the MIP images confirms the above findings.  IMPRESSION: No evidence of pulmonary embolism. Changes consistent with hepatic and pulmonary metastatic disease. Electronically Signed   By: MarInez CatalinaD.   On: 09/04/2015 12:39    ASSESSMENT/PLAN:    [facility] cycle 5 of Folfiri/Avastin chemotherapy regimen on 08/14/2015.  Blood counts obtained today were all within normal limits.  Restaging CT with contrast of the chest/abdomen/pelvis obtained on 08/24/2015 revealed a mixed response to therapy.  Decision was made at that time to hold any further chemotherapy.  Patient has a planned follow-up with Dr. RosDenman GeorgeN oncology for further evaluation and management.  [place] [facility] [place]  Dehydration Patient denies any nausea, vomiting, or diarrhea; but does admit to decreased appetite.  She feels that she may be slightly dehydrated today.  Exam today revealed vital signs stable; with the exception of a tachycardia with a rate of 125 on initial check.  Patient received IV fluid rehydration while at the cancer Center today.  Heart rate was decreased to 103 on recheck.  Patient was advised to push fluids at home as well.  Chest wall pain Patient states that she developed increased left chest wall discomfort over this past weekend.  She denies any known injury or trauma to the area.  Patient states that she has increased pain to the  left upper chest area with any movement and with deep inspiration.  She occasionally feel short of breath as well.  She states the pain is non--radiating.  Exam today reveals breath sounds clear bilaterally; with no cough or wheezes.  She has no respiratory distress whatsoever.  Tachycardic rate at 125 on initial check; which then decreased to 103 after receiving IV fluids.  Patient also states that she has been taking only one half of an oxycodone tablet; that has been occasionally increasing the oxycodone  to 2 tablets.  Patient was given morphine IV while at the [place]; and then also took one of her oxycodone tablets as well.  CT angiogram of the chest with contrast obtained today was negative for pulmonary embolism.  Also, there was no further progression noted on the scan.  EKG obtained today revealed a sinus tach with a heart rate of 105.  Discussed with patient the possibility of being transferred to the emergency department for further evaluation and management; the patient declined.  Reviewed all findings in detail with Dr. Lynnae Sandhoff agreed that patient's chest wall discomfort may very well be secondary to her cancer diagnosis and questionable unrecognized bone metastasis.  Advised patient to go directly to the emergency department for any worsening symptoms whatsoever.  Hyperphosphatemia Alkaline phosphatase increased from 283 up to 494.  Quite possibly, this is secondary to patient's cancer diagnosis.  Will continue to monitor closely.  Patient stated understanding of all instructions; and was in agreement with this plan of care. The patient knows to call the clinic with any problems, questions or concerns.   This was a shared visit with Dr. Benay Spice today.  Total time spent with patient was 40 minutes;  with greater than 75 percent of that time spent in face to face counseling regarding patient's symptoms,  and coordination of care and follow up.  Disclaimer:This dictation was prepared with Dragon/digital dictation along with [employer]. Any transcriptional errors that result from this process are unintentional.  Drue Second, NP 09/04/2015    this was a shared visit with Drue Second. Ms. Hunger was interviewed and examined. A chest CT is negative for pulmonary embolism. The chest wall discomfort may be related to occult metastatic disease. She will continue oxycodone for pain.    Ms. Lafoy will return as scheduled 09/10/2015.  Julieanne Manson, M.D.

## 2015-09-04 NOTE — Assessment & Plan Note (Signed)
Patient states that she developed increased left chest wall discomfort over this past weekend.  She denies any known injury or trauma to the area.  Patient states that she has increased pain to the left upper chest area with any movement and with deep inspiration.  She occasionally feel short of breath as well.  She states the pain is non--radiating.  Exam today reveals breath sounds clear bilaterally; with no cough or wheezes.  She has no respiratory distress whatsoever.  Tachycardic rate at 125 on initial check; which then decreased to 103 after receiving IV fluids.  Patient also states that she has been taking only one half of an oxycodone tablet; that has been occasionally increasing the oxycodone to 2 tablets.  Patient was given morphine IV while at the [place]; and then also took one of her oxycodone tablets as well.  CT angiogram of the chest with contrast obtained today was negative for pulmonary embolism.  Also, there was no further progression noted on the scan.  EKG obtained today revealed a sinus tach with a heart rate of 105.  Discussed with patient the possibility of being transferred to the emergency department for further evaluation and management; the patient declined.  Reviewed all findings in detail with Dr. Lynnae Sandhoff agreed that patient's chest wall discomfort may very well be secondary to her cancer diagnosis and questionable unrecognized bone metastasis.  Advised patient to go directly to the emergency department for any worsening symptoms whatsoever.

## 2015-09-04 NOTE — Assessment & Plan Note (Signed)
Patient received cycle 5 of Folfiri/Avastin chemotherapy regimen on 08/14/2015.  Blood counts obtained today were all within normal limits.  Restaging CT with contrast of the chest/abdomen/pelvis obtained on 08/24/2015 revealed a mixed response to therapy.  Decision was made at that time to hold any further chemotherapy.  Patient has a planned follow-up with Dr. Denman George [facility] oncology for further evaluation and management.  Patient also has an appointment for follow-up visit here at the [place] on 09/11/2015.

## 2015-09-04 NOTE — Assessment & Plan Note (Signed)
Alkaline phosphatase increased from 283 up to 494.  Quite possibly, this is secondary to patient's cancer diagnosis.  Will continue to monitor closely.

## 2015-09-05 ENCOUNTER — Other Ambulatory Visit: Payer: Self-pay | Admitting: Urology

## 2015-09-06 ENCOUNTER — Telehealth: Payer: Self-pay | Admitting: *Deleted

## 2015-09-06 NOTE — Telephone Encounter (Signed)
"  This is Brianna Vasquez calling about my mom.  We were there Tuesday with unexplained chest pan.  She now has pain all over, doubled up in pain because her entire body hurts,  I touched her toe and she screamed.  She takes the medicineShe's in so much pain she can't sit up or use her legs to move the sheet off her legs.  I lift her up.  I thought she was going to die.  I do not know what to do.  She thinks it's withdrawal from neulasta injections.  Do I need to bring her back in or what."  Denies shortness of breath, fever.  Sets alarm to take two oxycodone for pain every four hours.

## 2015-09-06 NOTE — Telephone Encounter (Signed)
Return call placed to patient.  She states that her pain is under control at this time and does not feel the need to come in today.  She states that she is eating and drinking without nausea.  She denies any fever or shortness of breath.  Pt has no questions or concerns at this time.  Pt will call her daughter Claris Pong regarding this call.

## 2015-09-10 ENCOUNTER — Ambulatory Visit: Payer: BLUE CROSS/BLUE SHIELD | Attending: Gynecologic Oncology | Admitting: Gynecologic Oncology

## 2015-09-10 ENCOUNTER — Other Ambulatory Visit (HOSPITAL_BASED_OUTPATIENT_CLINIC_OR_DEPARTMENT_OTHER): Payer: BLUE CROSS/BLUE SHIELD

## 2015-09-10 ENCOUNTER — Encounter: Payer: Self-pay | Admitting: Gynecologic Oncology

## 2015-09-10 VITALS — BP 100/67 | HR 123 | Temp 97.9°F | Resp 18 | Ht 65.0 in | Wt 127.1 lb

## 2015-09-10 DIAGNOSIS — K449 Diaphragmatic hernia without obstruction or gangrene: Secondary | ICD-10-CM | POA: Insufficient documentation

## 2015-09-10 DIAGNOSIS — Z800 Family history of malignant neoplasm of digestive organs: Secondary | ICD-10-CM | POA: Insufficient documentation

## 2015-09-10 DIAGNOSIS — Z87891 Personal history of nicotine dependence: Secondary | ICD-10-CM | POA: Insufficient documentation

## 2015-09-10 DIAGNOSIS — K219 Gastro-esophageal reflux disease without esophagitis: Secondary | ICD-10-CM | POA: Diagnosis not present

## 2015-09-10 DIAGNOSIS — Z820 Family history of epilepsy and other diseases of the nervous system: Secondary | ICD-10-CM | POA: Insufficient documentation

## 2015-09-10 DIAGNOSIS — R1900 Intra-abdominal and pelvic swelling, mass and lump, unspecified site: Secondary | ICD-10-CM

## 2015-09-10 DIAGNOSIS — C7951 Secondary malignant neoplasm of bone: Secondary | ICD-10-CM

## 2015-09-10 DIAGNOSIS — Z808 Family history of malignant neoplasm of other organs or systems: Secondary | ICD-10-CM | POA: Insufficient documentation

## 2015-09-10 DIAGNOSIS — N135 Crossing vessel and stricture of ureter without hydronephrosis: Secondary | ICD-10-CM | POA: Insufficient documentation

## 2015-09-10 DIAGNOSIS — C562 Malignant neoplasm of left ovary: Secondary | ICD-10-CM | POA: Insufficient documentation

## 2015-09-10 DIAGNOSIS — R42 Dizziness and giddiness: Secondary | ICD-10-CM | POA: Diagnosis not present

## 2015-09-10 DIAGNOSIS — Z803 Family history of malignant neoplasm of breast: Secondary | ICD-10-CM | POA: Diagnosis not present

## 2015-09-10 DIAGNOSIS — R443 Hallucinations, unspecified: Secondary | ICD-10-CM | POA: Diagnosis not present

## 2015-09-10 DIAGNOSIS — R918 Other nonspecific abnormal finding of lung field: Secondary | ICD-10-CM | POA: Diagnosis not present

## 2015-09-10 DIAGNOSIS — F419 Anxiety disorder, unspecified: Secondary | ICD-10-CM | POA: Insufficient documentation

## 2015-09-10 DIAGNOSIS — K769 Liver disease, unspecified: Secondary | ICD-10-CM | POA: Diagnosis not present

## 2015-09-10 DIAGNOSIS — Z960 Presence of urogenital implants: Secondary | ICD-10-CM | POA: Insufficient documentation

## 2015-09-10 LAB — CBC WITH DIFFERENTIAL/PLATELET
BASO%: 1.4 % (ref 0.0–2.0)
BASOS ABS: 0.1 10*3/uL (ref 0.0–0.1)
EOS ABS: 0.3 10*3/uL (ref 0.0–0.5)
EOS%: 3.1 % (ref 0.0–7.0)
HCT: 35.3 % (ref 34.8–46.6)
HEMOGLOBIN: 11.4 g/dL — AB (ref 11.6–15.9)
LYMPH%: 16.3 % (ref 14.0–49.7)
MCH: 29.6 pg (ref 25.1–34.0)
MCHC: 32.2 g/dL (ref 31.5–36.0)
MCV: 92.1 fL (ref 79.5–101.0)
MONO#: 1.1 10*3/uL — ABNORMAL HIGH (ref 0.1–0.9)
MONO%: 10.6 % (ref 0.0–14.0)
NEUT#: 7 10*3/uL — ABNORMAL HIGH (ref 1.5–6.5)
NEUT%: 68.6 % (ref 38.4–76.8)
Platelets: 332 10*3/uL (ref 145–400)
RBC: 3.83 10*6/uL (ref 3.70–5.45)
RDW: 16.6 % — AB (ref 11.2–14.5)
WBC: 10.2 10*3/uL (ref 3.9–10.3)
lymph#: 1.7 10*3/uL (ref 0.9–3.3)

## 2015-09-10 LAB — COMPREHENSIVE METABOLIC PANEL
ALBUMIN: 3 g/dL — AB (ref 3.5–5.0)
ALT: 29 U/L (ref 0–55)
AST: 52 U/L — ABNORMAL HIGH (ref 5–34)
Alkaline Phosphatase: 592 U/L — ABNORMAL HIGH (ref 40–150)
Anion Gap: 9 mEq/L (ref 3–11)
BUN: 6.9 mg/dL — AB (ref 7.0–26.0)
CHLORIDE: 102 meq/L (ref 98–109)
CO2: 28 meq/L (ref 22–29)
CREATININE: 0.7 mg/dL (ref 0.6–1.1)
Calcium: 10.1 mg/dL (ref 8.4–10.4)
EGFR: >90 mL/min/{1.73_m2} (ref >90)
Glucose: 104 mg/dl (ref 70–140)
Potassium: 4.1 mEq/L (ref 3.5–5.1)
SODIUM: 138 meq/L (ref 136–145)
Total Bilirubin: 1.59 mg/dL — ABNORMAL HIGH (ref 0.20–1.20)
Total Protein: 7.7 g/dL (ref 6.4–8.3)

## 2015-09-10 MED ORDER — OXYCODONE HCL ER 10 MG PO T12A: 10.0000 mg | EXTENDED_RELEASE_TABLET | Freq: Two times a day (BID) | ORAL | Status: AC

## 2015-09-10 NOTE — Progress Notes (Signed)
POSTOPERATIVE FOLLOWUP AND TREATMENT COUNSELING Assessment:    55 y.o. 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   S/p ex lap and BSO on 01/09/15.  S/p FOLFOX and FOLFIRI chemotherapy with mixed response. Overall significant decline in performance status (ECOG 3). Poor prognosis with limited life expectancy anticipated (<59month).  Few alternative treatment alternatives available.  I spent 45 minutes in face to face counseling with DTenayaand her family members. We discussed the status of her cancer including imaging results. We discussed prognosis with her cancer we discussed treatment planning. We also discussed end-of-life planning.  The first concern is pain from her chest wall and right upper abdomen. I'm suspicious given the presence of bony metastases elsewhere that she has metastatic disease in her thoracic spine or chest wall. Am recommending PET scan to better evaluate for occult bony metastases that may benefit from palliative treatment to improve symptoms. I suspect that her right upper abdominal pain may be due to capsular extension from her known liver metastases. I am recommending starting OxyContin 10 mg twice a day to add to her immediate release oxycodone analgesic regimen.  Her second concern is her prognosis and responsiveness to treatment by her cancer. I discussed that a mucinous ovarian cancer carries with it a very poor prognosis and the advanced stage because it does not respond well to known chemotherapeutic regimens. I discussed that her performance status is poor and as such she is not a good candidate for clinical trials though would be completely reasonable for her to be evaluated by a clinical investigators [facility] undergo tumor biopsy and Foundation 1 testing, and further evaluation to see if she is a candidate for a MATCH trial. I discussed the highly experimental nature of these trials, and the potential for increased toxicity in morbidity,  which, in the face of her existing poor performance status may be why she is a poor candidate for such interventions. I discussed how her advancing malignancy is resulting in impaired energy levels and nutrition. We discussed options to optimize her nutritional status. I discussed anticipated symptom profile moving forward as she continues cyst. From her advancing malignancy. I recommended that she be active in activities that give her pleasure at present she may not have the performance status to travel or participate in many activities in the near future. I recommended that she and her family discuss end-of-life planning particularly with respect to DO NOT RESUSCITATE orders. I advocated for her discontinuing chemotherapy, with attention made to palliative care in optimizing quality of life and her final months.  HPI:  Brianna CULMERis a 55y.o. year female seen on 01/05/15 in consultation at the request of Dr. RZadie Rhineregarding management of a newly diagnosed left pelvic mass, hydronephrosis, liver metastases, and lung metastases. The patient first noted the mass in the left lower quadrant. CT scan was obtained showing the above-noted findings as well as a enlarged periaortic lymph node and multiple lung base nodules and multiple liver parenchymal metastases. There is no evidence of ascites. Patient previously had a hysterectomy for menorrhagia and uterine fibroids. She has no other significant gynecologic history.  She has a family history of a paternal cousin with breast cancer who has a BRCA mutation. She has a paternal aunt with breast cancer and her father died of pancreatic cancer. The patient her self has not been tested for BRCA.  The present time the patient has some pain left lower quadrant. She denies any flank pain.  She has urinary frequency. She is noted that she has more constipation in recent months where previously she had more diarrhea consistent with irritable bowel syndrome. She reported  episodes of bright red rectal bleeding in late July.  The patient had colonoscopy in April 2016 which was limited due to anatomic distortion of her sigmoid colon (felt to be secondary to postop adhesions and diverticular disease).  Preoperative tumor markers revealed a CA 125 of 32.8 And a CEA of 30.  She was taken to the OR by Dr Nancy Marus on 01/09/15 and an exploratory laparotomy and BSO was performed. Intraoperative findings included an 8 cm complex left adnexal mass densely adherent to the left pelvic sidewall and rectosigmoid colon. Left retroperitoneal fibrosis with dilated ureters to the pelvic brim. Surgically absent uterus. Normal right adnexa. Palpable intraparenchymal liver metastases as well as tumor on the surface of the liver. No carcinomatosis or ascites. Frozen section consistent with metastatic mucinous adenocarcinoma. A ureteral stent was placed on the left during the surgery.  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 Immunostains showed positivity to CD X2, CEA, and cytokeratin 7. Immunostains negative for ER, PR, P5 04S, WT 1 and cytokeratin 20.  Interval Hx: Postoperative colonoscopy revealed sigmoid colon compression by what was felt to be an extra intake lesion and therefore her tumor has been described as a primary ovarian mucinous malignancy.  She's been treated by Benay Spice at the current [facility]. She began chemotherapy with cycle 1 of FOLFOX beginning on 02/14/2016. This continued until cycle 5 in November 2016 where imaging revealed a mixed response to treatment with progression of hepatic metastases but some response in the lung and left rectoperineal space. A new left acetabular lesion was identified and this was treated with palliative radiation. She change chemotherapy to FOLFIRI/Avastin beginning with cycle 1 on Jerry fourth 2017 and has completed 5 cycles of this with the last  administered on 08/14/2015. CT scan of the chest abdomen and pelvis performed on 08/24/2015 showed a mixed response with enlargement of several liver lesions and lung nodules and enlarging pelvic mass that was causing complete left ureteral obstruction and some partial right ureteral compression for which she is stented.  She returns today to be evaluated for consideration of next step treatment regimens given the mixed response she has had to conventional regimens for mucinous and GI malignancies.  Current Outpatient Prescriptions on File Prior to Visit  Medication Sig Dispense Refill  . ALPRAZolam (XANAX) 0.5 MG tablet Take 1 tablet (0.5 mg total) by mouth every 8 (eight) hours as needed. for anxiety (Patient taking differently: Take 0.25-0.5 mg by mouth 3 (three) times daily. Takes half a tablet during the day and a full tablet at night.) 50 tablet 0  . Ascorbic Acid (VITAMIN C) 1000 MG tablet Take 1,000 mg by mouth daily.    . B Complex Vitamins (B COMPLEX PO) Take 1 tablet by mouth daily. Reported on 07/13/2015    . calcium carbonate (TUMS - DOSED IN MG ELEMENTAL CALCIUM) 500 MG chewable tablet Chew 1-2 tablets by mouth 3 (three) times daily as needed for indigestion or heartburn.    . dexamethasone 0.5 MG/5ML elixir Take 5 mls PO QID PRN mucositis. 80 mL 1  . ECHINACEA PO Take 1 tablet by mouth daily.     . eszopiclone (LUNESTA) 2 MG TABS tablet Take 1 tablet (2 mg total) by mouth at bedtime as needed for sleep.  Take immediately before bedtime (Patient not taking: Reported on 09/04/2015) 30 tablet 0  . lidocaine-prilocaine (EMLA) cream Apply to port site one hour prior to use. Do not rub in. Cover with plastic. 30 g 0  . loperamide (IMODIUM) 1 MG/5ML solution Take 10 mLs (2 mg total) by mouth as needed for diarrhea or loose stools. 120 mL 0  . magic mouthwash w/lidocaine SOLN Take 5 mLs by mouth 4 (four) times daily as needed for mouth pain ([employer] formula w/ nystatin and lidocaine 1:1.). (Patient  not taking: Reported on 09/04/2015) 240 mL 1  . ondansetron (ZOFRAN ODT) 4 MG disintegrating tablet Take 1 tablet (4 mg total) by mouth every 8 (eight) hours as needed for nausea or vomiting. (Patient not taking: Reported on 09/04/2015) 20 tablet 0  . ondansetron (ZOFRAN) 8 MG tablet Take 1 tablet (8 mg total) by mouth every 8 (eight) hours. (Patient not taking: Reported on 09/04/2015) 90 tablet 0  . oxyCODONE (OXY IR/ROXICODONE) 5 MG immediate release tablet Take 1-2 tabs every 4 hours as needed for pain 60 tablet 0  . POTASSIUM PO Take 1 tablet by mouth daily.    Marland Kitchen PRESCRIPTION MEDICATION Chemo at [place] [facility] through pump for 2 days at home and gets pump connected after Chemo IV at [facility] and then comes back to get it disconnected after the 2 days    . prochlorperazine (COMPAZINE) 10 MG tablet Take 1 tablet (10 mg total) by mouth every 6 (six) hours as needed for nausea. (Patient not taking: Reported on 09/04/2015) 30 tablet 0  . sucralfate (CARAFATE) 1 GM/10ML suspension Take 10 mLs (1 g total) by mouth 4 (four) times daily as needed. (Patient taking differently: Take 1 g by mouth 4 (four) times daily as needed (stomach upset.). ) 420 mL 0   No current facility-administered medications on file prior to visit.   Allergies  Allergen Reactions  . Other Swelling and Other (See Comments)    Adhesive Glue-Caused rash and burning.   . Ativan [Lorazepam] Other (See Comments)    Dizziness, loss of balance  . Darvon [Propoxyphene] Other (See Comments)    Hallucinations    Past Medical History  Diagnosis Date  . Wears glasses   . Diverticulosis     dx 09/2014  . Esophagus hernia     hx of  . GERD (gastroesophageal reflux disease)     once in a while  . Anemia     hx of, none since hysterectomy  . Headache     severe, for last few mornings  . Anxiety     recent, situational  . Cancer ([place])     "ovary tumor-on colon, kidney, spots on liver and lungs"   Past Surgical History  Procedure  Laterality Date  . Wisdom tooth extraction  ~2001  . Cholecystectomy  2007    lap choli  . Breast lumpectomy Right 10/2013  . Vaginal hysterectomy  2005  . Colonoscopy  2016  . Laparotomy N/A 01/16/2015    Procedure: EXPLORATORY LAPAROTOMY ;  Surgeon: Nancy Marus, MD;  Location: [facility];  Service: Gynecology;  Laterality: N/A;  . Oophorectomy Bilateral 01/16/2015    Procedure: BILATERAL OOPHORECTOMY RESECTION OF PELVIC MASS ;  Surgeon: Nancy Marus, MD;  Location: [facility];  Service: Gynecology;  Laterality: Bilateral;  . Cystoscopy w/ ureteral stent placement Left 01/16/2015    Procedure: CYSTOSCOPY WITH RETROGRADE PYELOGRAM/URETERAL STENT PLACEMENT;  Surgeon: Carolan Clines, MD;  Location: [facility];  Service: Urology;  Laterality:  Left;  . Porta cath insertion    . Colonoscopy with propofol N/A 02/09/2015    Procedure: COLONOSCOPY WITH PROPOFOL;  Surgeon: Ronald Lobo, MD;  Location: [facility];  Service: Endoscopy;  Laterality: N/A;   Family History  Problem Relation Age of Onset  . Pancreatic cancer Father 39  . Breast cancer Paternal Aunt     dx. 61-41  . Breast cancer Paternal Grandmother 29  . Cancer Paternal Grandmother     "adenocarcinoma of gall bladder possibly"  . Colon cancer Maternal Grandfather 76  . Alzheimer's disease Paternal Grandfather   . Skin cancer Mother     dx. 52-61; squamous cell carcinoma  . Colon polyps Mother     6-7 total  . Other Mother     TAH due to issue with IUD; abnormal breast findings  . Other Sister     TAH due to pre-cancerous cells of cervix; still has ovarie  . Breast cancer Cousin     dx. 66 or younger; radical mastectomy; BRCA+  . Throat cancer Cousin     dx. 30; smokeless tobacco user  . Pancreatic cancer Other   . Lung cancer Cousin   . Pancreatic cancer Other    Social History   Social History  . Marital Status: Single    Spouse Name: N/A  . Number of Children: N/A  . Years of Education: N/A   Occupational History  . Not  on file.   Social History Main Topics  . Smoking status: Former Smoker -- 4 years    Types: Cigarettes    Quit date: 10/07/1985  . Smokeless tobacco: Never Used     Comment: 2-3 cigs per day  . Alcohol Use: 4.2 oz/week    7 Glasses of wine per week     Comment: glass nightly  . Drug Use: No  . Sexual Activity: No   Other Topics Concern  . 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 She has no fever or chills. Eyes: No blurred vision Ears, Nose, Mouth, Throat: No dizziness, headaches or changes in hearing. No mouth sores. Cardiovascular: No chest pain, palpitations or edema. Respiratory:  No shortness of breath, wheezing or cough Gastrointestinal: She has normal bowel movements without diarrhea or constipation. She denies any nausea or vomiting. She denies blood in her stool or heart burn. Genitourinary:  See HPI. + hematuria since stent placement Musculoskeletal: Denies muscle weakness or joint pains.  Skin:  She has no skin changes, rashes or itching Neurological:  Denies dizziness or headaches. No neuropathy, no numbness or tingling. Psychiatric:  She denies depression or anxiety. Hematologic/Lymphatic:   No easy bruising or bleeding   Physical Exam: There were no vitals taken for this visit. General: Well dressed, well nourished in no apparent distress.   HEENT:  Normocephalic and atraumatic, no lesions.  Extraocular muscles intact. Sclerae anicteric. Pupils equal, round, reactive. No mouth sores or ulcers. Thyroid is normal size, not nodular, midline. Skin:  No lesions or rashes. Breasts:  deferred Lungs:  deferred Cardiovascular:  deferred Abdomen:  Soft, nontender, nondistended.  No palpable masses.  No hepatosplenomegaly.  No ascites. Normal bowel  sounds.  No hernias.  Incisions is clean ,dry and intact with steristrips and no  signs of infection or drainage. Genitourinary: deferred Extremities: deferred Psychiatric: Mood and affect are appropriate. Neurological: Awake, alert and oriented x 3. Sensation is intact, no neuropathy.  Musculoskeletal: deferred  Donaciano Eva, MD

## 2015-09-10 NOTE — Progress Notes (Signed)
EKG AND CHEST CT FROM 09-04-15 EPIC

## 2015-09-10 NOTE — Patient Instructions (Addendum)
Brianna Vasquez  09/10/2015   Your procedure is scheduled on: 09-14-15  Report to [facility]  Entrance take [facility]  elevators to 3rd floor to  [place] at 800 AM.  Call this number if you have problems the morning of surgery [phone]   Remember: ONLY 1 PERSON MAY GO WITH YOU TO SHORT STAY TO GET  READY MORNING OF [place].  Do not eat food or drink liquids :After Midnight.     Take these medicines the morning of surgery with A SIP OF WATER: XANAX, OXYCODONE IF NEEDED                               You may not have any metal on your body including hair pins and              piercings  Do not wear jewelry, make-up, lotions, powders or perfumes, deodorant             Do not wear nail polish.  Do not shave  48 hours prior to surgery.              Men may shave face and neck.   Do not bring valuables to the hospital. [place].  Contacts, dentures or bridgework may not be worn into surgery.  Leave suitcase in the car. After surgery it may be brought to your room.     Patients discharged the day of surgery will not be allowed to drive home.  Name and phone number of your driver:  Special Instructions: N/A              Please read over the following fact sheets you were given: _____________________________________________________________________             [facility] - Preparing for Surgery Before surgery, you can play an important role.  Because skin is not sterile, your skin needs to be as free of germs as possible.  You can reduce the number of germs on your skin by washing with CHG (chlorahexidine gluconate) soap before surgery.  CHG is an antiseptic cleaner which kills germs and bonds with the skin to continue killing germs even after washing. Please DO NOT use if you have an allergy to CHG or antibacterial soaps.  If your skin becomes reddened/irritated stop using the CHG and inform your  nurse when you arrive at Short Stay. Do not shave (including legs and underarms) for at least 48 hours prior to the first CHG shower.  You may shave your face/neck. Please follow these instructions carefully:  1.  Shower with CHG Soap the night before surgery and the  morning of Surgery.  2.  If you choose to wash your hair, wash your hair first as usual with your  normal  shampoo.  3.  After you shampoo, rinse your hair and body thoroughly to remove the  shampoo.                           4.  Use CHG as you would any other liquid soap.  You can apply chg directly  to the skin and wash  Gently with a scrungie or clean washcloth.  5.  Apply the CHG Soap to your body ONLY FROM THE NECK DOWN.   Do not use on face/ open                           Wound or open sores. Avoid contact with eyes, ears mouth and genitals (private parts).                       Wash face,  Genitals (private parts) with your normal soap.             6.  Wash thoroughly, paying special attention to the area where your surgery  will be performed.  7.  Thoroughly rinse your body with warm water from the neck down.  8.  DO NOT shower/wash with your normal soap after using and rinsing off  the CHG Soap.                9.  Pat yourself dry with a clean towel.            10.  Wear clean pajamas.            11.  Place clean sheets on your bed the night of your first shower and do not  sleep with pets. Day of Surgery : Do not apply any lotions/deodorants the morning of surgery.  Please wear clean clothes to the hospital/surgery center.  FAILURE TO FOLLOW THESE INSTRUCTIONS MAY RESULT IN THE CANCELLATION OF YOUR SURGERY PATIENT SIGNATURE_________________________________  NURSE SIGNATURE__________________________________  ________________________________________________________________________

## 2015-09-10 NOTE — Patient Instructions (Signed)
Plan to have a lab work today and a PET scan on April 13th at 11 am with nothing to eat or drink 6 hours before.  Begin taking the oxycontin twice daily (every twelve hours) and follow up with Dr. Benay Spice as scheduled.  Please call for any questions or concerns.

## 2015-09-11 ENCOUNTER — Inpatient Hospital Stay (HOSPITAL_COMMUNITY)
Admission: RE | Admit: 2015-09-11 | Discharge: 2015-09-11 | Disposition: A | Discharge status: Home / Self Care | Payer: BLUE CROSS/BLUE SHIELD | Source: Ambulatory Visit

## 2015-09-11 ENCOUNTER — Ambulatory Visit (HOSPITAL_BASED_OUTPATIENT_CLINIC_OR_DEPARTMENT_OTHER): Payer: BLUE CROSS/BLUE SHIELD | Admitting: Nurse Practitioner

## 2015-09-11 VITALS — BP 102/69 | HR 124 | Temp 97.7°F | Resp 18 | Ht 65.0 in | Wt 128.8 lb

## 2015-09-11 DIAGNOSIS — C7951 Secondary malignant neoplasm of bone: Secondary | ICD-10-CM

## 2015-09-11 DIAGNOSIS — C5702 Malignant neoplasm of left fallopian tube: Secondary | ICD-10-CM | POA: Diagnosis not present

## 2015-09-11 DIAGNOSIS — K5900 Constipation, unspecified: Secondary | ICD-10-CM

## 2015-09-11 DIAGNOSIS — C7800 Secondary malignant neoplasm of unspecified lung: Secondary | ICD-10-CM | POA: Diagnosis not present

## 2015-09-11 DIAGNOSIS — G8918 Other acute postprocedural pain: Secondary | ICD-10-CM

## 2015-09-11 DIAGNOSIS — C562 Malignant neoplasm of left ovary: Secondary | ICD-10-CM | POA: Diagnosis not present

## 2015-09-11 DIAGNOSIS — C787 Secondary malignant neoplasm of liver and intrahepatic bile duct: Secondary | ICD-10-CM | POA: Diagnosis not present

## 2015-09-11 DIAGNOSIS — Z809 Family history of malignant neoplasm, unspecified: Secondary | ICD-10-CM

## 2015-09-11 DIAGNOSIS — C7989 Secondary malignant neoplasm of other specified sites: Secondary | ICD-10-CM

## 2015-09-11 DIAGNOSIS — C801 Malignant (primary) neoplasm, unspecified: Secondary | ICD-10-CM

## 2015-09-11 DIAGNOSIS — R109 Unspecified abdominal pain: Secondary | ICD-10-CM

## 2015-09-11 LAB — CEA: CEA1: 171.9 ng/mL — AB (ref 0.0–4.7)

## 2015-09-11 MED ORDER — SORBITOL 70 % PO SOLN: 15.0000 mL | Freq: Two times a day (BID) | ORAL | Status: AC | PRN

## 2015-09-11 MED ORDER — OXYCODONE HCL 5 MG PO TABS: ORAL_TABLET | ORAL | Status: AC

## 2015-09-11 NOTE — Progress Notes (Signed)
Prior Authorization completed for Oxycontin 10 MG Controlled Release Tablets Every 12 Hours. [facility]: [phone] contacted and updated with approval , pharmacist states they received the approval and the prescription will be filled.

## 2015-09-11 NOTE — Progress Notes (Addendum)
[place] OFFICE PROGRESS NOTE   Diagnosis: Ovarian cancer   INTERVAL HISTORY:   Brianna Vasquez returns as scheduled. She has pain in various locations. The left anterior chest pain is better. She recently has been experiencing significant pain at the left "side". She is taking oxycodone 1-3 tablets every 4 hours. She was prescribed OxyContin yesterday but has not started it yet. She continues to have problems with constipation. She is taking a stool softener and Miralax. She complains of "overwhelming weakness". Appetite remains poor.  Objective:  Vital signs in last 24 hours:  Blood pressure 102/69, pulse 124, temperature 97.7 F (36.5 C), temperature source Oral, resp. rate 18, height '5\' 5"'$  (1.651 m), weight 128 lb 12.8 oz (58.423 kg), SpO2 98 %.    Resp: Lungs clear bilaterally. Cardio: Regular, tachycardic. GI: Abdomen soft and nontender. No organomegaly. Vascular: No leg edema. Neuro: Leg strength intact. Skin: 1/2-1 cm cutaneous nodule left upper anterior chest. Port-A-Cath without erythema.   Lab Results:  Lab Results  Component Value Date   WBC 10.2 09/10/2015   HGB 11.4* 09/10/2015   HCT 35.3 09/10/2015   MCV 92.1 09/10/2015   PLT 332 09/10/2015   NEUTROABS 7.0* 09/10/2015    Imaging:  No results found.  Medications: I have reviewed the patient's current medications.  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 Metastatic left periaortic adenopathy.  PET scan 02/06/2015 with extensive hypermetabolic lymphadenopathy in the chest, abdomen, and pelvis, Residual left pelvic tumor,  sigmoid colon lesion, liver/lung metastases, and a left acetabulum metastasis  Colonoscopy 02/09/2015 with a stricture at 30 cm, biopsy positive for adenocarcinoma, no intrinsic colon mass noted  Cycle 1 FOLFOX 02/14/2015  Cycle 2 FOLFOX 02/28/2015  CT 03/16/2015 with improvement in lung metastases, stable liver lesions and left pelvic soft tissue fullness, right ovarian vein thrombosis  Cycle 3 FOLFOX 03/26/2015 with Neulasta support  Cycle 4 FOLFOX 04/16/2015  Cycle 5 FOLFOX 05/07/2015 (oxaliplatin and 5-FU dose reduced, 5-FU bolus eliminated)  CTs chest, abdomen, and pelvis on 06/05/2015-mixed response of lung metastases, progression of hepatic metastases, decreased soft tissue in the left retroperitoneal space and left psoas, increased mixed lytic/sclerotic metastasis at the left acetabulum  Cycle 1 FOLFIRI/Avastin 06/13/2015  Cycle 2 FOLFIRI/Avastin 07/03/2015  Cycle 3 FOLFIRI/Avastin 07/17/2015  Cycle 4 FOLFIRI/Avastin 07/31/2015  Cycle 5 FOLFIRI/Avastin 08/14/2015  CTs 08/24/2015-mixed response with enlargement of several liver lesions and lung nodules, others are smaller, pelvic mass is larger  2. Placement of a left ureter stent 01/16/2015  3. Family history of multiple cancers, report of a paternal cousin-BRCA2 positive  4. Upper endoscopy 01/29/2015 with a localized thick gastric fold with a small ulcer  5. Mild swelling of the left foot and ankle 01/27/2015  6. Pain secondary to the left pelvic tumor and acetabulum metastasis-progressive acetabulum metastasis on the CT 06/05/2015  Palliative radiation, 1 fraction, 06/07/2015  7. Acute/delayed nausea following FOLFOX-Aloxi and amend were added with cycle 2, prophylactic Decadron with cycle 3  8. Acute transient loss of bladder control with cycle 1 FOLFOX  9. Severe neutropenia following cycle 2 FOLFOX. Neulasta added beginning with cycle 3.  10. Admission 04/26/2015 with severe mucositis  secondary to chemotherapy  11.  History of pancytopenia secondary chemotherapy-red cell transfusion 04/28/2015, platelet transfusion 04/28/2015   Disposition: Brianna Vasquez performance status continues to decline. Dr. Benay Spice recommends a supportive care approach, hospice referral. She is in agreement. End-of-life issues/Code status discussed. She is not ready to make a decision on CODE STATUS.  She will continue oxycodone as needed for the pain. She plans to begin OxyContin today. She understands to contact the office if pain is not adequately controlled. Dr. Denman George has referred her for a PET scan to evaluate for occult metastatic disease involving the bones.  For constipation she will begin sorbitol 15 mL twice daily.  We scheduled a return visit in 2 weeks.  Patient seen with Dr. Benay Spice. 25 minutes were spent face-to-face at today's visit with the majority of that time involved in counseling/coordination of care.    Ned Card ANP/GNP-BC   09/11/2015  12:30 PM   this was a shared visit with Ned Card. Brianna Vasquez was interviewed and examined. Her performance status has declined. She has diffuse pain, potentially related to progressive bone or soft tissue metastases.    She does not appear to be a candidate for further systemic therapy. She agrees to a [facility] referral.   We will follow-up on the PET scan ordered by Dr. Denman George.   Brianna Vasquez, M.D.

## 2015-09-13 ENCOUNTER — Telehealth: Payer: Self-pay | Admitting: Hematology

## 2015-09-13 ENCOUNTER — Encounter: Payer: Self-pay | Admitting: Gynecologic Oncology

## 2015-09-13 ENCOUNTER — Encounter (HOSPITAL_COMMUNITY)
Admission: RE | Admit: 2015-09-13 | Discharge: 2015-09-13 | Disposition: A | Discharge status: Home / Self Care | Payer: BLUE CROSS/BLUE SHIELD | Source: Ambulatory Visit | Attending: Urology | Admitting: Urology

## 2015-09-13 ENCOUNTER — Encounter (HOSPITAL_COMMUNITY): Payer: Self-pay

## 2015-09-13 ENCOUNTER — Telehealth: Payer: Self-pay | Admitting: Gynecologic Oncology

## 2015-09-13 DIAGNOSIS — C562 Malignant neoplasm of left ovary: Secondary | ICD-10-CM | POA: Diagnosis not present

## 2015-09-13 DIAGNOSIS — Z923 Personal history of irradiation: Secondary | ICD-10-CM | POA: Diagnosis not present

## 2015-09-13 DIAGNOSIS — Z800 Family history of malignant neoplasm of digestive organs: Secondary | ICD-10-CM | POA: Diagnosis not present

## 2015-09-13 DIAGNOSIS — C7800 Secondary malignant neoplasm of unspecified lung: Secondary | ICD-10-CM | POA: Diagnosis not present

## 2015-09-13 DIAGNOSIS — Z87891 Personal history of nicotine dependence: Secondary | ICD-10-CM | POA: Diagnosis not present

## 2015-09-13 DIAGNOSIS — N135 Crossing vessel and stricture of ureter without hydronephrosis: Secondary | ICD-10-CM | POA: Diagnosis present

## 2015-09-13 DIAGNOSIS — C5702 Malignant neoplasm of left fallopian tube: Secondary | ICD-10-CM | POA: Diagnosis not present

## 2015-09-13 DIAGNOSIS — Z9221 Personal history of antineoplastic chemotherapy: Secondary | ICD-10-CM | POA: Diagnosis not present

## 2015-09-13 DIAGNOSIS — C787 Secondary malignant neoplasm of liver and intrahepatic bile duct: Secondary | ICD-10-CM | POA: Diagnosis not present

## 2015-09-13 DIAGNOSIS — K449 Diaphragmatic hernia without obstruction or gangrene: Secondary | ICD-10-CM | POA: Diagnosis not present

## 2015-09-13 DIAGNOSIS — C7951 Secondary malignant neoplasm of bone: Secondary | ICD-10-CM | POA: Diagnosis not present

## 2015-09-13 HISTORY — DX: Reserved for inherently not codable concepts without codable children: IMO0001

## 2015-09-13 NOTE — Progress Notes (Signed)
09-11-15 - [facility] L. Marcello Moores, NP (gyn.onc.) - EPIC 09-10-15 - LOV - Dr. Denman George (gyn onc) - EPIC 09-10-15 - CBC and CMP labs - EPIC 09-04-15 - EKG and Chest CT - EPIC

## 2015-09-13 NOTE — Telephone Encounter (Signed)
per pof to sch pt appt-cld & left pt a message of time & date of appt on 4/19 @1 :45

## 2015-09-13 NOTE — Telephone Encounter (Signed)
Left message for patient stating her PET scan was approved after a peer to peer from Dr. Denman George.  Advised that I had reached out to [facility] in Radiology about having the PET moved up.  Patient advised she would be contacted.

## 2015-09-13 NOTE — Patient Instructions (Signed)
Brianna Vasquez  09/13/2015   Your procedure is scheduled on: September 14, 2015  Report to [facility] Main  Entrance take [facility]  elevators to 3rd floor to  [place] at  8:00 AM.  Call this number if you have problems the morning of surgery [phone]   Remember: ONLY 1 PERSON MAY GO WITH YOU TO SHORT STAY TO GET  READY MORNING OF [place].  Do not eat food or drink liquids :After Midnight.     Take these medicines the morning of surgery with A SIP OF WATER: Xanax, Oxycodone if needed DO NOT TAKE ANY DIABETIC MEDICATIONS DAY OF YOUR SURGERY                               You may not have any metal on your body including hair pins and              piercings  Do not wear jewelry, make-up, lotions, powders or perfumes, deodorant             Do not wear nail polish.  Do not shave  48 hours prior to surgery.            Do not bring valuables to the hospital. [place].  Contacts, dentures or bridgework may not be worn into surgery.  Leave suitcase in the car. After surgery it may be brought to your room.     Patients discharged the day of surgery will not be allowed to drive home.  Name and phone number of your driver:  Special Instructions: coughing and deep breathing exercises, leg exercises              Please read over the following fact sheets you were given: _____________________________________________________________________             [facility] - Preparing for Surgery Before surgery, you can play an important role.  Because skin is not sterile, your skin needs to be as free of germs as possible.  You can reduce the number of germs on your skin by washing with CHG (chlorahexidine gluconate) soap before surgery.  CHG is an antiseptic cleaner which kills germs and bonds with the skin to continue killing germs even after washing. Please DO NOT use if you have an allergy to CHG or antibacterial  soaps.  If your skin becomes reddened/irritated stop using the CHG and inform your nurse when you arrive at Short Stay. Do not shave (including legs and underarms) for at least 48 hours prior to the first CHG shower.  You may shave your face/neck. Please follow these instructions carefully:  1.  Shower with CHG Soap the night before surgery and the  morning of Surgery.  2.  If you choose to wash your hair, wash your hair first as usual with your  normal  shampoo.  3.  After you shampoo, rinse your hair and body thoroughly to remove the  shampoo.                           4.  Use CHG as you would any other liquid soap.  You can apply chg directly  to the skin and wash  Gently with a scrungie or clean washcloth.  5.  Apply the CHG Soap to your body ONLY FROM THE NECK DOWN.   Do not use on face/ open                           Wound or open sores. Avoid contact with eyes, ears mouth and genitals (private parts).                       Wash face,  Genitals (private parts) with your normal soap.             6.  Wash thoroughly, paying special attention to the area where your surgery  will be performed.  7.  Thoroughly rinse your body with warm water from the neck down.  8.  DO NOT shower/wash with your normal soap after using and rinsing off  the CHG Soap.                9.  Pat yourself dry with a clean towel.            10.  Wear clean pajamas.            11.  Place clean sheets on your bed the night of your first shower and do not  sleep with pets. Day of Surgery : Do not apply any lotions/deodorants the morning of surgery.  Please wear clean clothes to the hospital/surgery center.  FAILURE TO FOLLOW THESE INSTRUCTIONS MAY RESULT IN THE CANCELLATION OF YOUR SURGERY PATIENT SIGNATURE_________________________________  NURSE SIGNATURE__________________________________  ________________________________________________________________________

## 2015-09-13 NOTE — Progress Notes (Signed)
09-13-15 - Talked to Dr. Marcell Barlow (anesthesia) regarding repeating pts. CMP (was drawn on 09-10-15 and Alk.Phos was [phone]).  Dr. Marcell Barlow said we do not need to repeat CMP (pt. On palliative care ).

## 2015-09-13 NOTE — Patient Instructions (Signed)
Peer to peer done by Dr. Denman George for PET scan.  Auth # [id number]

## 2015-09-14 ENCOUNTER — Ambulatory Visit (HOSPITAL_COMMUNITY)
Admission: RE | Admit: 2015-09-14 | Discharge: 2015-09-14 | Disposition: A | Discharge status: Home / Self Care | Payer: BLUE CROSS/BLUE SHIELD | Source: Ambulatory Visit | Attending: Urology | Admitting: Urology

## 2015-09-14 ENCOUNTER — Ambulatory Visit (HOSPITAL_COMMUNITY): Payer: BLUE CROSS/BLUE SHIELD | Admitting: Certified Registered"

## 2015-09-14 ENCOUNTER — Encounter (HOSPITAL_COMMUNITY)
Admission: RE | Disposition: A | Discharge status: Home / Self Care | Payer: Self-pay | Source: Ambulatory Visit | Attending: Urology

## 2015-09-14 ENCOUNTER — Encounter (HOSPITAL_COMMUNITY): Payer: Self-pay | Admitting: Anesthesiology

## 2015-09-14 DIAGNOSIS — C7951 Secondary malignant neoplasm of bone: Secondary | ICD-10-CM | POA: Insufficient documentation

## 2015-09-14 DIAGNOSIS — C5702 Malignant neoplasm of left fallopian tube: Secondary | ICD-10-CM | POA: Insufficient documentation

## 2015-09-14 DIAGNOSIS — N135 Crossing vessel and stricture of ureter without hydronephrosis: Secondary | ICD-10-CM | POA: Diagnosis not present

## 2015-09-14 DIAGNOSIS — Z923 Personal history of irradiation: Secondary | ICD-10-CM | POA: Insufficient documentation

## 2015-09-14 DIAGNOSIS — Z9221 Personal history of antineoplastic chemotherapy: Secondary | ICD-10-CM | POA: Insufficient documentation

## 2015-09-14 DIAGNOSIS — Z800 Family history of malignant neoplasm of digestive organs: Secondary | ICD-10-CM | POA: Insufficient documentation

## 2015-09-14 DIAGNOSIS — C562 Malignant neoplasm of left ovary: Secondary | ICD-10-CM

## 2015-09-14 DIAGNOSIS — K449 Diaphragmatic hernia without obstruction or gangrene: Secondary | ICD-10-CM | POA: Insufficient documentation

## 2015-09-14 DIAGNOSIS — C787 Secondary malignant neoplasm of liver and intrahepatic bile duct: Secondary | ICD-10-CM | POA: Insufficient documentation

## 2015-09-14 DIAGNOSIS — C7800 Secondary malignant neoplasm of unspecified lung: Secondary | ICD-10-CM | POA: Insufficient documentation

## 2015-09-14 DIAGNOSIS — Z87891 Personal history of nicotine dependence: Secondary | ICD-10-CM | POA: Insufficient documentation

## 2015-09-14 HISTORY — PX: CYSTOSCOPY W/ URETERAL STENT PLACEMENT: SHX1429

## 2015-09-14 SURGERY — CYSTOSCOPY, FLEXIBLE, WITH STENT REPLACEMENT
Anesthesia: General | Laterality: Left

## 2015-09-14 MED ORDER — OXYCODONE-ACETAMINOPHEN 5-325 MG PO TABS: 1.0000 | ORAL_TABLET | ORAL | Status: AC | PRN

## 2015-09-14 MED ORDER — DEXAMETHASONE SODIUM PHOSPHATE 10 MG/ML IJ SOLN
INTRAMUSCULAR | Status: DC | PRN
  Administered 2015-09-14: 5 mg via INTRAVENOUS

## 2015-09-14 MED ORDER — PROPOFOL 10 MG/ML IV BOLUS
INTRAVENOUS | Status: DC | PRN
  Administered 2015-09-14: 140 mg via INTRAVENOUS

## 2015-09-14 MED ORDER — MIDAZOLAM HCL 2 MG/2ML IJ SOLN
INTRAMUSCULAR | Status: AC
  Filled 2015-09-14: qty 2

## 2015-09-14 MED ORDER — FENTANYL CITRATE (PF) 100 MCG/2ML IJ SOLN
INTRAMUSCULAR | Status: AC
  Filled 2015-09-14: qty 2

## 2015-09-14 MED ORDER — SODIUM CHLORIDE 0.9 % IR SOLN
Status: DC | PRN
  Administered 2015-09-14: 2000 mL

## 2015-09-14 MED ORDER — MIDAZOLAM HCL 5 MG/5ML IJ SOLN
INTRAMUSCULAR | Status: DC | PRN
  Administered 2015-09-14: 2 mg via INTRAVENOUS

## 2015-09-14 MED ORDER — CEFAZOLIN SODIUM-DEXTROSE 2-4 GM/100ML-% IV SOLN
INTRAVENOUS | Status: AC
  Filled 2015-09-14: qty 100

## 2015-09-14 MED ORDER — HYDROMORPHONE HCL 1 MG/ML IJ SOLN: 0.5000 mg | INTRAMUSCULAR | Status: DC | PRN

## 2015-09-14 MED ORDER — IOPAMIDOL (ISOVUE-300) INJECTION 61%
INTRAVENOUS | Status: AC
  Filled 2015-09-14: qty 50

## 2015-09-14 MED ORDER — LIDOCAINE HCL (CARDIAC) 20 MG/ML IV SOLN
INTRAVENOUS | Status: AC
  Filled 2015-09-14: qty 5

## 2015-09-14 MED ORDER — LIDOCAINE HCL (CARDIAC) 20 MG/ML IV SOLN
INTRAVENOUS | Status: DC | PRN
  Administered 2015-09-14: 60 mg via INTRAVENOUS

## 2015-09-14 MED ORDER — IOPAMIDOL (ISOVUE-300) INJECTION 61%
INTRAVENOUS | Status: DC | PRN
  Administered 2015-09-14: 3 mL

## 2015-09-14 MED ORDER — 0.9 % SODIUM CHLORIDE (POUR BTL) OPTIME
TOPICAL | Status: DC | PRN
  Administered 2015-09-14: 1000 mL

## 2015-09-14 MED ORDER — FENTANYL CITRATE (PF) 100 MCG/2ML IJ SOLN
INTRAMUSCULAR | Status: DC | PRN
  Administered 2015-09-14 (×5): 50 ug via INTRAVENOUS

## 2015-09-14 MED ORDER — ONDANSETRON HCL 4 MG/2ML IJ SOLN
INTRAMUSCULAR | Status: AC
  Filled 2015-09-14: qty 2

## 2015-09-14 MED ORDER — ONDANSETRON HCL 4 MG/2ML IJ SOLN
INTRAMUSCULAR | Status: DC | PRN
  Administered 2015-09-14 (×2): 2 mg via INTRAVENOUS

## 2015-09-14 MED ORDER — ONDANSETRON HCL 4 MG/2ML IJ SOLN: 4.0000 mg | Freq: Once | INTRAMUSCULAR | Status: DC | PRN

## 2015-09-14 MED ORDER — CEFAZOLIN SODIUM-DEXTROSE 2-4 GM/100ML-% IV SOLN
2.0000 g | INTRAVENOUS | Status: AC
  Administered 2015-09-14: 2 g via INTRAVENOUS
  Filled 2015-09-14: qty 100

## 2015-09-14 MED ORDER — DEXAMETHASONE SODIUM PHOSPHATE 10 MG/ML IJ SOLN
INTRAMUSCULAR | Status: AC
  Filled 2015-09-14: qty 1

## 2015-09-14 MED ORDER — PROPOFOL 10 MG/ML IV BOLUS
INTRAVENOUS | Status: AC
  Filled 2015-09-14: qty 20

## 2015-09-14 MED ORDER — LACTATED RINGERS IV SOLN
INTRAVENOUS | Status: DC
  Administered 2015-09-14: 1000 mL via INTRAVENOUS
  Administered 2015-09-14: 11:00:00 via INTRAVENOUS

## 2015-09-14 MED ORDER — STERILE WATER FOR IRRIGATION IR SOLN
Status: DC | PRN
  Administered 2015-09-14: 6000 mL

## 2015-09-14 MED ORDER — ACETAMINOPHEN 500 MG PO TABS
1000.0000 mg | ORAL_TABLET | Freq: Once | ORAL | Status: AC
  Administered 2015-09-14: 1000 mg via ORAL
  Filled 2015-09-14: qty 2

## 2015-09-14 MED ORDER — FLUORESCEIN SODIUM 10 % IJ SOLN
500.0000 mg | Freq: Once | INTRAMUSCULAR | Status: AC
  Administered 2015-09-14: 300 mg via INTRAVENOUS
  Filled 2015-09-14: qty 5

## 2015-09-14 SURGICAL SUPPLY — 14 items
BAG URO CATCHER STRL LF (MISCELLANEOUS) ×3 IMPLANT
CATH INTERMIT  6FR 70CM (CATHETERS) ×3 IMPLANT
CLOTH BEACON ORANGE TIMEOUT ST (SAFETY) ×3 IMPLANT
GOWN STRL REUS W/TWL LRG LVL3 (GOWN DISPOSABLE) ×3 IMPLANT
GOWN STRL REUS W/TWL XL LVL3 (GOWN DISPOSABLE) ×3 IMPLANT
GUIDEWIRE ANG ZIPWIRE 038X150 (WIRE) ×3 IMPLANT
GUIDEWIRE STR DUAL SENSOR (WIRE) ×6 IMPLANT
MANIFOLD NEPTUNE II (INSTRUMENTS) ×3 IMPLANT
PACK CYSTO (CUSTOM PROCEDURE TRAY) ×3 IMPLANT
SCRUB PCMX 4 OZ (MISCELLANEOUS) IMPLANT
STENT CONTOUR 7FRX24 (STENTS) ×3 IMPLANT
STENT URET 6FRX24 CONTOUR (STENTS) ×3 IMPLANT
TUBING CONNECTING 10 (TUBING) ×2 IMPLANT
TUBING CONNECTING 10' (TUBING) ×1

## 2015-09-14 NOTE — Discharge Instructions (Addendum)
Ureteral Stent Implantation, Care After Refer to this sheet in the next few weeks. These instructions provide you with information on caring for yourself after your procedure. Your health care provider may also give you more specific instructions. Your treatment has been planned according to current medical practices, but problems sometimes occur. Call your health care provider if you have any problems or questions after your procedure. WHAT TO EXPECT AFTER THE PROCEDURE You should be back to normal activity within 48 hours after the procedure. Nausea and vomiting may occur and are commonly the result of anesthesia. It is common to experience sharp pain in the back or lower abdomen and penis with voiding. This is caused by movement of the ends of the stent with the act of urinating.It usually goes away within minutes after you have stopped urinating. HOME CARE INSTRUCTIONS Make sure to drink plenty of fluids. You may have small amounts of bleeding, causing your urine to be red. This is normal. Certain movements may trigger pain or a feeling that you need to urinate. You may be given medicines to prevent infection or bladder spasms. Be sure to take all medicines as directed. Only take over-the-counter or prescription medicines for pain, discomfort, or fever as directed by your health care provider. Do not take aspirin, as this can make bleeding worse. Your stent will be left in until the blockage is resolved. This may take 2 weeks or longer, depending on the reason for stent implantation. You may have an X-ray exam to make sure your ureter is open and that the stent has not moved out of position (migrated). The stent can be removed by your health care provider in the office. Medicines may be given for comfort while the stent is being removed. Be sure to keep all follow-up appointments so your health care provider can check that you are healing properly. SEEK MEDICAL CARE IF:  You experience increasing  pain.  Your pain medicine is not working. SEEK IMMEDIATE MEDICAL CARE IF:  Your urine is dark red or has blood clots.  You are leaking urine (incontinent).  You have a fever, chills, feeling sick to your stomach (nausea), or vomiting.  Your pain is not relieved by pain medicine.  The end of the stent comes out of the urethra.  You are unable to urinate.   This information is not intended to replace advice given to you by your health care provider. Make sure you discuss any questions you have with your health care provider.   Document Released: 01/26/2013 Document Revised: 05/31/2013 Document Reviewed: 12/08/2014 Elsevier Interactive Patient Education 2016 [place] Released: 04/15/2004 Document Revised: 06/16/2014 Document Reviewed: 07/22/2013 Elsevier Interactive Patient Education 2016 [place] CARE INSTRUCTIONS  Activity: Rest for the remainder of the day.  Do not drive or operate equipment today.  You may resume normal activities in one to two days as instructed by your physician.   Meals: Drink plenty of liquids and eat light foods such as gelatin or soup this evening.  You may return to a normal meal plan tomorrow.  Return to Work: You may return to work in one to two days or as instructed by your physician.  Special Instructions / Symptoms: Call your physician if any of these symptoms occur:   -persistent or heavy bleeding  -bleeding which continues after first few urination  -large blood clots that are difficult to pass  -urine stream diminishes or stops completely  -fever equal to or  higher than 101 degrees Farenheit.  -cloudy urine with a strong, foul odor  -severe pain  Females should always wipe from front to back after elimination.  You may feel some burning pain when you urinate.  This should disappear with time.  Applying moist heat to the lower abdomen or a hot tub bath may help relieve the pain. \  Follow-Up /  Date of Return Visit to Your Physician:   Call for an appointment to arrange follow-up.  Patient Signature:  ________________________________________________________  Nurse's Signature:  ________________________________________________________

## 2015-09-14 NOTE — Transfer of Care (Signed)
Immediate Anesthesia Transfer of Care Note  Patient: Brianna Vasquez  Procedure(s) Performed: Procedure(s): REMOVAL AND REPLACE LEFT DOUBLE J STENT, URETEROSCOPY , RETROGRADE (Left)  Patient Location: PACU  Anesthesia Type:General  Level of Consciousness: sedated, patient cooperative and responds to stimulation  Airway & Oxygen Therapy: Patient Spontanous Breathing and Patient connected to face mask oxygen  Post-op Assessment: Report given to RN and Post -op Vital signs reviewed and stable  Post vital signs: Reviewed and stable  Last Vitals:  Filed Vitals:   09/14/15 0819  BP: 105/61  Pulse: 118  Temp: 36.6 C  Resp: 16    Complications: No apparent anesthesia complications

## 2015-09-14 NOTE — H&P (Signed)
Reason For Visit F/u to discuss JJ stent exchange   Active Problems Problems  1. 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 She is s/p bilateral salpingo-oophorectomy on 01/16/15. CT on 01/03/15 confirmed Lt adnexal mass, Lt hydronephrosis, and metastatic lung/liver lesions. Metastatic Lt periaortic adenopathy.  PET scan on 02/06/15 showed extensive hypermetabolic lymphadenopathy in the chest, abdomen, and pelvis. Residual Lt pelvic tumor, sigmoid colon lesion, liver/lung metastases, and Lt acetabulum metastasis. Colonoscopy on 02/09/15 showed stricture, and was positive for adenocarcinoma (no intrinsic colon mass). She had chemotherapy (Folfox) 02/14/15 thru 05/07/15 with mixed response to lung mets and progression of hepatic metastases, decrease in soft tissue in Lt retroperitoneal space and Lt psoas, and increased mixed lytic/sclerotic metastasis of Lt acetabulum. Chemotherapy (Folfiri) from 06/13/15 thru 08/14/15. She had palliative radiation on 06/07/15.    She had a cysto/Lt RPG/Lt JJ stent on 01/16/15, for hydronephrosis 2ndary apple-core external obstruction of the Left mid-ureter. She currently has a 48F x 26cm JJ stent in place.   Surgical History Problems  1. History of Cystoscopy With Insertion Of Ureteral Stent Left  Current Meds 1. OxyCODONE HCl - 5 MG Oral Tablet;  Therapy: (Recorded:28Mar2017) to Recorded 2. Xanax 0.5 MG Oral Tablet;  Therapy: (Recorded:28Mar2017) to Recorded  Allergies Medication  1. No Known Drug Allergies Non-Medication  2. Adhesive Tape  Family History Problems  1. Family history of malignant neoplasm (Z80.9) 2. Family history of pancreatic cancer (Z80.0) : Father  Social History Problems  1. Caffeine use (F15.90) 2. Disabled 3. Former smoker [phone])   socially x 2yrs 4. No alcohol use 5. Number of  children   1 son, 1 daughter  Review of Systems Genitourinary, constitutional, skin, eye, otolaryngeal, hematologic/lymphatic, cardiovascular, pulmonary, endocrine, musculoskeletal, gastrointestinal, neurological and psychiatric system(s) were reviewed and pertinent findings if present are noted and are otherwise negative.  Genitourinary: weak urinary flow.  Gastrointestinal: diarrhea and constipation.  Constitutional: feeling tired (fatigue) and recent weight loss.  Eyes: blurred vision and diplopia.  Hematologic/Lymphatic: a tendency to easily bruise.  Cardiovascular: chest pain.  Respiratory: shortness of breath.  Musculoskeletal: back pain and joint pain.  Psychiatric: anxiety.    Vitals Vital Signs [Data Includes: Last 1 Day]  Recorded: 28Mar2017 09:50AM  Height: 5 ft 5 in Weight: 126 lb  BMI Calculated: 20.97 BSA Calculated: 1.63 Blood Pressure: 121 / 85 Temperature: 97.1 F Heart Rate: 124  Physical Exam Constitutional: well developed . No acute distress. The patient appears well hydrated. Thinning wF in NAD.  ENT:. The ears and nose are normal in appearance. The oropharynx is normal.  Pulmonary: No respiratory distress.  Cardiovascular:. No peripheral edema.  Abdomen: The abdomen is flat and not distended. The abdomen is not firm. Tenderness is present diffusely throughout the abdomen. Bowel sounds are diminished.  Lymphatics: The femoral and inguinal nodes are not enlarged or tender.  Skin: Normal skin turgor, no visible rash and no visible skin lesions.  Neuro/Psych:. Mood and affect are appropriate.    Results/Data Urine [Data Includes: Last 1 Day]   [id number]  COLOR AMBER   APPEARANCE CLOUDY   SPECIFIC GRAVITY 1.025   pH 6.0   GLUCOSE NEGATIVE   BILIRUBIN NEGATIVE   KETONE NEGATIVE   BLOOD 3+   PROTEIN 2+   NITRITE NEGATIVE   LEUKOCYTE ESTERASE 2+   SQUAMOUS EPITHELIAL/HPF 6-10 HPF  WBC >60 WBC/HPF  RBC  10-20 RBC/HPF  BACTERIA FEW HPF  CRYSTALS See  Below HPF  CASTS NONE SEEN LPF  Yeast NONE SEEN HPF  Selected Results  UA With REFLEX [id number] 09:16AM Gaynelle Arabian, Chancey Ringel  SPECIMEN TYPE: CLEAN CATCH   Test Name Result Flag Reference  COLOR AMBER A YELLOW  ** PLEASE NOTE CHANGE IN UNIT OF MEASURE AND REFERENCE RANGE(S). **   BIOCHEMICALS MAY BE AFFECTED BY THE COLOR OF THE URINE.  APPEARANCE CLOUDY A CLEAR  SPECIFIC GRAVITY 1.025  1.001-1.035  pH 6.0  5.0-8.0  GLUCOSE NEGATIVE  NEGATIVE  BILIRUBIN NEGATIVE  NEGATIVE  VERIFIED BY REPEAT ANALYSIS.  KETONE NEGATIVE  NEGATIVE  BLOOD 3+ A NEGATIVE  PROTEIN 2+ A NEGATIVE  NITRITE NEGATIVE  NEGATIVE  LEUKOCYTE ESTERASE 2+ A NEGATIVE  SQUAMOUS EPITHELIAL/HPF 6-10 HPF A <=5  TRANSITIONAL EPITHELIAL CELLS 0-5                    <=5           HPF  WBC >60 WBC/HPF A <=5  RBC 10-20 RBC/HPF A <=2  BACTERIA FEW HPF A NONE SEEN  CRYSTALS See Below HPF A NONE SEEN  CALCIUM OXALATE CRYSTALS             FEW       ABN   NONE SEEN     HPF  CASTS NONE SEEN LPF  NONE SEEN  Yeast NONE SEEN HPF  NONE SEEN   Assessment Assessed  1. Ureteral obstruction (N13.5) 2. 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 Will try to place 7.0 x 24cm stent.   Plan Left ovarian epithelial cancer, Ureteral obstruction  1. Follow-up Schedule Surgery Office  Follow-up  Status: Hold For - Appointment   Requested for: [phone]  Stent exchange.   Discussion/Summary cc: Dr. Julieanne Manson  cc: Dr. Graylon Gunning Electronically signed by : Carolan Clines, M.D.; Sep 04 2015 10:18AM EST

## 2015-09-14 NOTE — Interval H&P Note (Signed)
History and Physical Interval Note:  09/14/2015 10:18 AM  Brianna Vasquez  has presented today for surgery, with the diagnosis of LEFT URETERAL OBSTRUCTION   The various methods of treatment have been discussed with the patient and family. After consideration of risks, benefits and other options for treatment, the patient has consented to  Procedure(s): REMOVAL AND REPLACE LEFT DOUBLE J STENT  (Left) as a surgical intervention .  The patient's history has been reviewed, patient examined, no change in status, stable for surgery.  I have reviewed the patient's chart and labs.  Questions were answered to the patient's satisfaction.     Brianna Vasquez I [name]

## 2015-09-14 NOTE — Anesthesia Preprocedure Evaluation (Signed)
Anesthesia Evaluation  Patient identified by MRN, date of birth, ID band Patient awake    Reviewed: Allergy & Precautions, NPO status , Patient's Chart, lab work & pertinent test results  Airway Mallampati: I  TM Distance: >3 FB Neck ROM: Full    Dental   Pulmonary shortness of breath, former smoker,    Pulmonary exam normal        Cardiovascular Normal cardiovascular exam     Neuro/Psych  Headaches,  Neuromuscular disease    GI/Hepatic hiatal hernia, GERD  ,  Endo/Other    Renal/GU      Musculoskeletal   Abdominal   Peds  Hematology  (+) anemia ,   Anesthesia Other Findings Cancer  Reproductive/Obstetrics                             Anesthesia Physical Anesthesia Plan  ASA: II  Anesthesia Plan: General   Post-op Pain Management:    Induction: Intravenous  Airway Management Planned: LMA and Oral ETT  Additional Equipment:   Intra-op Plan:   Post-operative Plan: Extubation in OR  Informed Consent: I have reviewed the patients History and Physical, chart, labs and discussed the procedure including the risks, benefits and alternatives for the proposed anesthesia with the patient or authorized representative who has indicated his/her understanding and acceptance.     Plan Discussed with: CRNA, Anesthesiologist and Surgeon  Anesthesia Plan Comments:         Anesthesia Quick Evaluation

## 2015-09-14 NOTE — Anesthesia Procedure Notes (Signed)
Procedure Name: LMA Insertion Date/Time: 09/14/2015 11:10 AM Performed by: Freddie Breech Pre-anesthesia Checklist: Patient identified, Emergency Drugs available, Patient being monitored, Suction available and Timeout performed Patient Re-evaluated:Patient Re-evaluated prior to inductionOxygen Delivery Method: Circle system utilized Preoxygenation: Pre-oxygenation with 100% oxygen Intubation Type: IV induction Ventilation: Mask ventilation without difficulty LMA: LMA inserted LMA Size: 3.0 Number of attempts: 1 Airway Equipment and Method: Patient positioned with wedge pillow Placement Confirmation: positive ETCO2,  CO2 detector and breath sounds checked- equal and bilateral Tube secured with: Tape Dental Injury: Teeth and Oropharynx as per pre-operative assessment

## 2015-09-14 NOTE — Anesthesia Postprocedure Evaluation (Signed)
Anesthesia Post Note  Patient: Brianna Vasquez  Procedure(s) Performed: Procedure(s) (LRB): REMOVAL AND REPLACE LEFT DOUBLE J STENT, URETEROSCOPY , RETROGRADE (Left)  Patient location during evaluation: PACU Anesthesia Type: General Level of consciousness: awake, sedated and patient cooperative Pain management: pain level controlled Vital Signs Assessment: post-procedure vital signs reviewed and stable Respiratory status: spontaneous breathing and respiratory function stable Cardiovascular status: blood pressure returned to baseline and stable Anesthetic complications: no    Last Vitals:  Filed Vitals:   09/14/15 1215 09/14/15 1239  BP: 105/66 117/72  Pulse: 98 95  Temp: 36.6 C 36.3 C  Resp: 15 14    Last Pain:  Filed Vitals:   09/14/15 1245  PainSc: 7                  Sweden Lesure EDWARD

## 2015-09-14 NOTE — Op Note (Signed)
Pre-operative diagnosis :                                                                    CC: Dr. Julieanne Manson           Metastatic mucinous adenocarcinoma,                                    CC: Df. Gehrig           Left Ovary and fallopian tube, with apple core lymphatic            Obstruction of the left ureter ( complete).   Postoperative diagnosis:  Same  Operation: Cystourethroscopy, removal of completely obstructed, stone encrusted left double-J stent, left retrograde pyelogram with interpretation, left ureteroscopy, left double-J stent (6 [place] by 26 cm.  Surgeon:  Chauncey Cruel. Gaynelle Arabian, MD  First assistant:  None  Anesthesia: Gen. LMA  Preparation:  After appropriate pre-anesthesia, the patient was brought the operative room, placed on the operating table in the dorsal supine position where general LMA anesthesia was induced. She was then replaced in the dorsal lithotomy position with pubis was prepped with Betadine solution and draped in usual fashion. The history was reviewed. The armband was double checked. The patient had previously been marked with magic marker in the left arm, and the left flank.  Review history:  Problems  1. 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 She is s/p bilateral salpingo-oophorectomy on 01/16/15. CT on 01/03/15 confirmed Lt adnexal mass, Lt hydronephrosis, and metastatic lung/liver lesions. Metastatic Lt periaortic adenopathy. PET scan on 02/06/15 showed extensive hypermetabolic lymphadenopathy in the chest, abdomen, and pelvis. Residual Lt pelvic tumor, sigmoid colon lesion, liver/lung metastases, and Lt acetabulum metastasis. Colonoscopy on 02/09/15 showed stricture, and was positive for adenocarcinoma (no intrinsic colon mass). She had chemotherapy (Folfox) 02/14/15 thru 05/07/15 with mixed response to lung  mets and progression of hepatic metastases, decrease in soft tissue in Lt retroperitoneal space and Lt psoas, and increased mixed lytic/sclerotic metastasis of Lt acetabulum. Chemotherapy (Folfiri) from 06/13/15 thru 08/14/15. She had palliative radiation on 06/07/15.    She had a cysto/Lt RPG/Lt JJ stent on 01/16/15, for hydronephrosis 2ndary apple-core external obstruction of the Left mid-ureter. She currently has a 45F x 26cm JJ stent in place.    Statement of  Likelihood of Success: Excellent. TIME-OUT observed.:  Procedure:  Cystoscopy was accomplished, and the left double-J stent placed in August, 6 months ago. The ureter now  is noted to be completely obstructed by stone formation. The stent is therefore removed part way, and was then attempted to be cut so that a wire could be passed through the remaining portion of the stent, but wire could not be passed above the level of obstruction of the ureter. The remaining stent was removed. Cystoscopy was then accomplished, and left retrograde pyelogram was performed, which showed complete obstruction of the left ureter, at the level of the pelvic brim. This is consistent with the level of obstruction noted on the PET scan secondary to massive lymphadenopathy  secondary the patient's cancer.  Both a movable core guidewire, as well as a Glidewire were attempted to be passed, but could not be passed up the  ureter. Therefore, ureteroscopy was accomplished, and multiple false passages were identified. Finally, a guidewire was passed into the renal pelvis, and coil. Was accomplished. A second wire was passed into the renal pelvis and coiled as well. Fluorescein  dye was given, but no fluorescein was seen in the urine for over 20 minutes. In the meantime, a 7 [place] by 24 cm  double-J stent was attempted be passed, but could not be passed up the ureter. Therefore, a 6 [place] by 24 cm double-J stent was passed and coiled in the renal pelvis-with difficulty. The ureter  was very tight.  The patient was then awakened, taken to recovery room in good condition.   Fluorescein dye was seen in the urine at the end of the procedure.

## 2015-09-17 ENCOUNTER — Telehealth: Payer: Self-pay | Admitting: Nurse Practitioner

## 2015-09-17 ENCOUNTER — Other Ambulatory Visit: Payer: Self-pay | Admitting: *Deleted

## 2015-09-17 ENCOUNTER — Encounter: Payer: Self-pay | Admitting: *Deleted

## 2015-09-17 DIAGNOSIS — C801 Malignant (primary) neoplasm, unspecified: Secondary | ICD-10-CM

## 2015-09-17 DIAGNOSIS — C562 Malignant neoplasm of left ovary: Secondary | ICD-10-CM

## 2015-09-17 DIAGNOSIS — G8918 Other acute postprocedural pain: Secondary | ICD-10-CM

## 2015-09-17 MED ORDER — ALPRAZOLAM 0.5 MG PO TABS: 0.2500 mg | ORAL_TABLET | Freq: Three times a day (TID) | ORAL | Status: AC

## 2015-09-17 MED ORDER — HYDROMORPHONE HCL 4 MG PO TABS
4.0000 mg | ORAL_TABLET | ORAL | Status: AC | PRN
Start: 2015-09-17 — End: ?

## 2015-09-17 NOTE — Progress Notes (Signed)
Pt called to inquire if appt on 09/26/15 could be moved sooner d/t family in town that would like to come to appt.  Appt moved to Monday, 09/24/15 per Dr. Benay Spice.  Refills filled for Dilaudid and Xanax per pt request.

## 2015-09-17 NOTE — Telephone Encounter (Signed)
per pof to sch pt appt-cld & spoke to pt and gave pt time & date for r/s appt

## 2015-09-20 ENCOUNTER — Encounter (HOSPITAL_COMMUNITY)
Admission: RE | Admit: 2015-09-20 | Discharge: 2015-09-20 | Disposition: A | Discharge status: Home / Self Care | Payer: BLUE CROSS/BLUE SHIELD | Source: Ambulatory Visit | Attending: Gynecologic Oncology | Admitting: Gynecologic Oncology

## 2015-09-20 DIAGNOSIS — C7951 Secondary malignant neoplasm of bone: Secondary | ICD-10-CM | POA: Diagnosis present

## 2015-09-20 DIAGNOSIS — R1900 Intra-abdominal and pelvic swelling, mass and lump, unspecified site: Secondary | ICD-10-CM | POA: Insufficient documentation

## 2015-09-20 LAB — GLUCOSE, CAPILLARY: GLUCOSE-CAPILLARY: 100 mg/dL — AB (ref 65–99)

## 2015-09-20 MED ORDER — FLUDEOXYGLUCOSE F - 18 (FDG) INJECTION
6.2700 | Freq: Once | INTRAVENOUS | Status: AC | PRN
Start: 2015-09-20 — End: 2015-09-20
  Administered 2015-09-20: 6.27 via INTRAVENOUS

## 2015-09-21 ENCOUNTER — Telehealth: Payer: Self-pay | Admitting: *Deleted

## 2015-09-21 NOTE — Telephone Encounter (Signed)
Per Dr. Benay Spice, message left on pt.'s personal cell phone to inform her that PET scan shows progressive disease in liver, lung nodules and bones, also few rib lesions which may explain chest pain. Pt instructed to call back with any questions.  Pt scheduled to see L. Thomas NP on 09/24/15.

## 2015-09-21 NOTE — Telephone Encounter (Signed)
-----   Message from Ladell Pier, MD sent at 09/21/2015  3:23 PM EDT ----- Please call patient, PET scan show progressive disease in liver, lung nodules, and bones, few rib lesions- may explain chest pain

## 2015-09-24 ENCOUNTER — Telehealth: Payer: Self-pay | Admitting: Oncology

## 2015-09-24 ENCOUNTER — Ambulatory Visit (HOSPITAL_BASED_OUTPATIENT_CLINIC_OR_DEPARTMENT_OTHER): Payer: BLUE CROSS/BLUE SHIELD | Admitting: Nurse Practitioner

## 2015-09-24 ENCOUNTER — Telehealth: Payer: Self-pay | Admitting: Gynecologic Oncology

## 2015-09-24 ENCOUNTER — Encounter: Payer: Self-pay | Admitting: Nurse Practitioner

## 2015-09-24 VITALS — BP 124/60 | HR 112 | Temp 98.2°F | Resp 20 | Wt 121.8 lb

## 2015-09-24 DIAGNOSIS — C7800 Secondary malignant neoplasm of unspecified lung: Secondary | ICD-10-CM

## 2015-09-24 DIAGNOSIS — K5900 Constipation, unspecified: Secondary | ICD-10-CM

## 2015-09-24 DIAGNOSIS — C562 Malignant neoplasm of left ovary: Secondary | ICD-10-CM | POA: Diagnosis not present

## 2015-09-24 DIAGNOSIS — C7951 Secondary malignant neoplasm of bone: Secondary | ICD-10-CM

## 2015-09-24 DIAGNOSIS — R1310 Dysphagia, unspecified: Secondary | ICD-10-CM

## 2015-09-24 DIAGNOSIS — C787 Secondary malignant neoplasm of liver and intrahepatic bile duct: Secondary | ICD-10-CM | POA: Diagnosis not present

## 2015-09-24 MED ORDER — MORPHINE SULFATE (CONCENTRATE) 20 MG/ML PO SOLN: 20.0000 mg | ORAL | Status: AC | PRN

## 2015-09-24 MED ORDER — FENTANYL 100 MCG/HR TD PT72: 100.0000 ug | MEDICATED_PATCH | TRANSDERMAL | Status: AC

## 2015-09-24 NOTE — Telephone Encounter (Signed)
cld pt to adv of appt time & date of 5/2@ 9:00 appt

## 2015-09-24 NOTE — Progress Notes (Addendum)
[facility] OFFICE PROGRESS NOTE   Diagnosis:  Ovarian cancer  INTERVAL HISTORY:   Ms. Parrack returns as scheduled. She continues to have pain at multiple sites. She is taking OxyContin 10 mg every 12 hours with Dilaudid 12 mg every 4 hours around-the-clock. She recently has had the sensation that pills and food/water are "getting stuck". She has regurgitated on one occasion. No significant nausea. Appetite remains poor. She continues to have issues with constipation.  Objective:  Vital signs in last 24 hours:  Blood pressure 124/60, pulse 112, temperature 98.2 F (36.8 C), resp. rate 20, weight 121 lb 12.8 oz (55.248 kg), SpO2 98 %.    HEENT: White coating over tongue. Mouth appears dry. Resp: Lungs clear bilaterally. Cardio: Regular, tachycardic. GI: No hepatomegaly. Vascular: No leg edema.  Skin: 1 cm cutaneous nodule left upper anterior chest. Port-A-Cath without erythema.    Lab Results:  Lab Results  Component Value Date   WBC 10.2 09/10/2015   HGB 11.4* 09/10/2015   HCT 35.3 09/10/2015   MCV 92.1 09/10/2015   PLT 332 09/10/2015   NEUTROABS 7.0* 09/10/2015    Imaging:  No results found.  Medications: I have reviewed the patient's current medications.  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 Metastatic left periaortic adenopathy.  PET scan 02/06/2015 with extensive hypermetabolic lymphadenopathy in the chest, abdomen, and pelvis, Residual left pelvic tumor, sigmoid colon lesion, liver/lung metastases, and a left acetabulum metastasis  Colonoscopy 02/09/2015 with a stricture at  30 cm, biopsy positive for adenocarcinoma, no intrinsic colon mass noted  Cycle 1 FOLFOX 02/14/2015  Cycle 2 FOLFOX 02/28/2015  CT 03/16/2015 with improvement in lung metastases, stable liver lesions and left pelvic soft tissue fullness, right ovarian vein thrombosis  Cycle 3 FOLFOX 03/26/2015 with Neulasta support  Cycle 4 FOLFOX 04/16/2015  Cycle 5 FOLFOX 05/07/2015 (oxaliplatin and 5-FU dose reduced, 5-FU bolus eliminated)  CTs chest, abdomen, and pelvis on 06/05/2015-mixed response of lung metastases, progression of hepatic metastases, decreased soft tissue in the left retroperitoneal space and left psoas, increased mixed lytic/sclerotic metastasis at the left acetabulum  Cycle 1 FOLFIRI/Avastin 06/13/2015  Cycle 2 FOLFIRI/Avastin 07/03/2015  Cycle 3 FOLFIRI/Avastin 07/17/2015  Cycle 4 FOLFIRI/Avastin 07/31/2015  Cycle 5 FOLFIRI/Avastin 08/14/2015  CTs 08/24/2015-mixed response with enlargement of several liver lesions and lung nodules, others are smaller, pelvic mass is larger  PET scan 09/20/2015 with progressive metastatic disease involving the mediastinum, lungs, liver and bones. There was a "new" subcutaneous chest wall lesion on the left side.  2. Placement of a left ureter stent 01/16/2015  3. Family history of multiple cancers, report of a paternal cousin-BRCA2 positive  4. Upper endoscopy 01/29/2015 with a localized thick gastric fold with a small ulcer  5. Mild swelling of the left foot and ankle 01/27/2015  6. Pain secondary to the left pelvic tumor and acetabulum metastasis-progressive acetabulum metastasis on the CT 06/05/2015  Palliative radiation, 1 fraction, 06/07/2015  7. Acute/delayed nausea following FOLFOX-Aloxi and amend were added with cycle 2, prophylactic Decadron with cycle 3  8. Acute transient loss of bladder control with cycle 1 FOLFOX  9. Severe neutropenia following cycle 2 FOLFOX. Neulasta added beginning with cycle  3.  10. Admission 04/26/2015 with severe mucositis secondary to chemotherapy  11. History of pancytopenia secondary chemotherapy-red cell transfusion11/19/2016, platelet transfusion 04/28/2015  Disposition: Ms. Welke performance status continues to decline. We reviewed with her and her family that the PET scan shows disease progression in multiple areas. She is not a candidate for additional chemotherapy. She is in agreement with a hospice referral. Her family has requested [facility]. We will place a referral today. We discussed CODE STATUS. She will be placed on NO CODE BLUE status.  She is symptomatic with pain. Due to the recent difficulty swallowing we changed the pain regimen to a Duragesic patch 100 g to be changed every 3 days and Roxanol 20 mg/mL 1-2 mL every 4 hours as needed.  For the constipation she will increase Sorbitol to 30 mL twice daily until she has a bowel movement.  She will return for a follow-up visit in approximately 2 weeks. She will contact the office in the interim with any problems.  Patient seen with Dr. Benay Spice. PET scan images reviewed on the computer with Ms. Brissett and her family. 30 minutes were spent face-to-face at today's visit with the majority of that time involved in counseling/coordination of care.    Ned Card ANP/GNP-BC   09/24/2015  3:40 PM  This was a shared visit with Ned Card.  Ms. Northway agrees to Hospice care and NCB status.  We adjusted the narcotic regimen.  Julieanne Manson, MD

## 2015-09-24 NOTE — Progress Notes (Signed)
Referral made to [facility]. Relevent notes and information faxed to [phone].

## 2015-09-24 NOTE — Telephone Encounter (Signed)
Returned call to [employer].  Discussed the patient's current status.  She is to call back to the office after talking with her family about making a potential appt with Dr. Denman George to discuss PET results.

## 2015-09-25 ENCOUNTER — Telehealth: Payer: Self-pay | Admitting: *Deleted

## 2015-09-25 ENCOUNTER — Encounter: Payer: Self-pay | Admitting: Nurse Practitioner

## 2015-09-25 NOTE — Progress Notes (Signed)
Sent mess to lisa to see if she wants the fentanyl for patient. See prev notes. I recd note from [facility]

## 2015-09-25 NOTE — Telephone Encounter (Signed)
Call received in [place] from pt's daughter, Brianna Vasquez.  She states they are in the process of getting under the care of [facility]  " we were seen last week and made the decision to proceed with the above and at the time of the visit I thought we had enough oxycontin to last until they would be taking on her care "  " I looked at her pills today and she only has enough for 1 more day ( 4 tablets ) but she has MS Contin in the home and I wasn't sure if we could switch over to them ?"  Per discussion - Brianna Vasquez states her mother takes a total of 40 mg oxycontin a day and they have the 30 mg MS contin.  The reason the MS Contin was stopped " was because mom thought it wasn't working but then she wasn't taking it right. She was only taking it 1 time a day "  Return call number given for Brianna Vasquez is [phone].  This message will be forwarded to MD and RN at desk for appropriate recommendations and contact to daughter.

## 2015-09-25 NOTE — Telephone Encounter (Signed)
[facility] to find out when pt will be admitted. Spoke with Jenny Reichmann, they have been in contact with pt's daughter, Cristie Hem. They are discussing admitting pt to inpatient hospice care. Jenny Reichmann is awaiting a return call from [facility] with a decision on admission to hospice home.

## 2015-09-25 NOTE — Telephone Encounter (Signed)
Attempted to call Brianna Vasquez, no answer. Left message for her to call office if she has any questions, concerns or needs after speaking with hospice staff.

## 2015-09-26 ENCOUNTER — Ambulatory Visit: Payer: BLUE CROSS/BLUE SHIELD | Admitting: Nurse Practitioner

## 2015-09-26 NOTE — Telephone Encounter (Signed)
Spoke with pt's daughter, Cristie Hem: she reports pt was admitted to [facility] on 4/18. Pt is now on narcotic analgesic drip via her port. She reports pt is confused. They have no needs at this time.

## 2015-10-09 ENCOUNTER — Ambulatory Visit: Payer: BLUE CROSS/BLUE SHIELD | Admitting: Oncology

## 2015-10-13 IMAGING — CT CT ABD-PELV W/O CM
2 of 4 series · 15 of 46 positions shown, 17 images · non-contrast
Comparison: Diagnostic CT of 01/03/2015.  PET of 02/06/2015.

CLINICAL DATA: Right flank pain since this morning. History of left
ureteric stent and cholecystectomy. Ovarian cancer, surgery
01/03/2015. Status post 2 around chemotherapy.

EXAM:
CT ABDOMEN AND PELVIS WITHOUT CONTRAST
TECHNIQUE: Multidetector CT imaging of the abdomen and pelvis was performed
following the standard protocol without IV contrast.

[Series 2: abd/ pelvis 5.0 i30f 1 · axial · 0.78mm/px · z∈[-702,-318]mm · 12 of 89 slices shown, 14 images]
[im 8/89  soft-tissue]
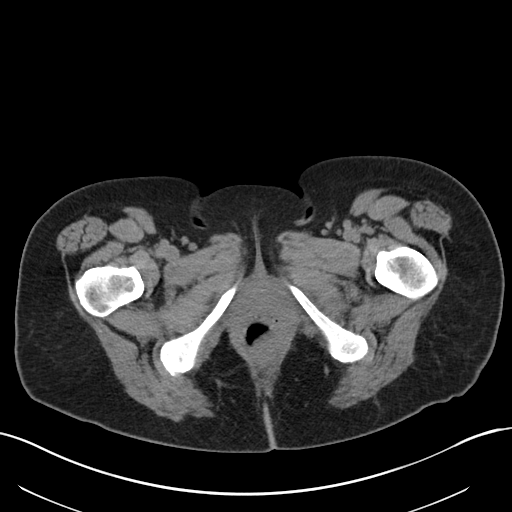
[im 8/89  bone]
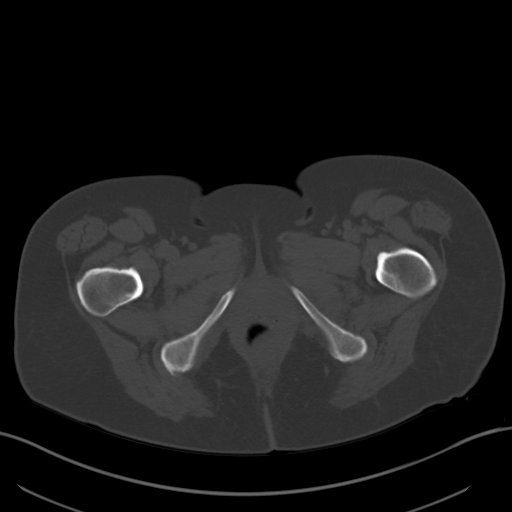
[im 15/89  soft-tissue]
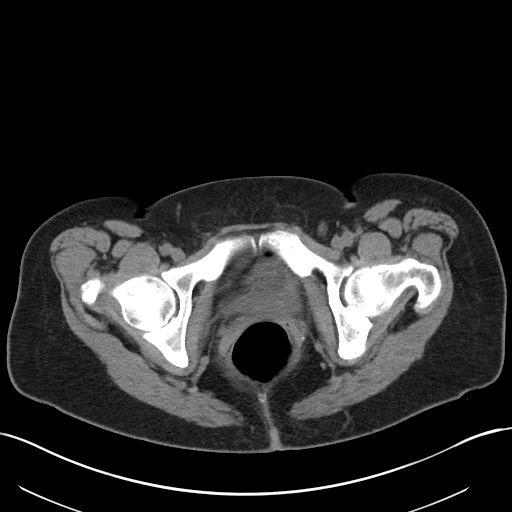
[im 22/89  soft-tissue]
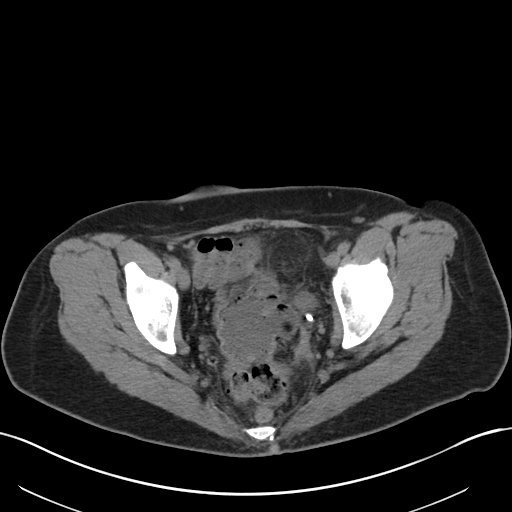
[im 29/89  soft-tissue]
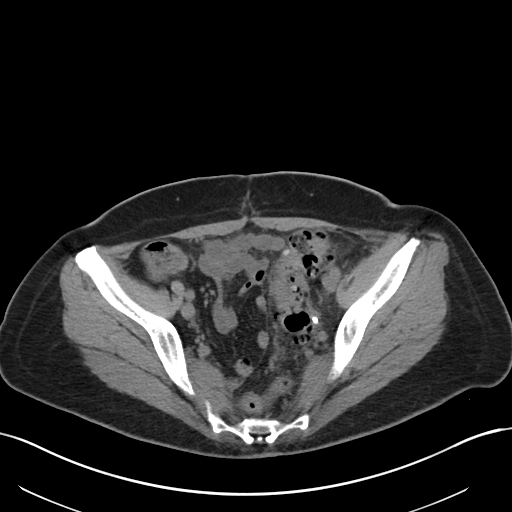
[im 36/89  soft-tissue]
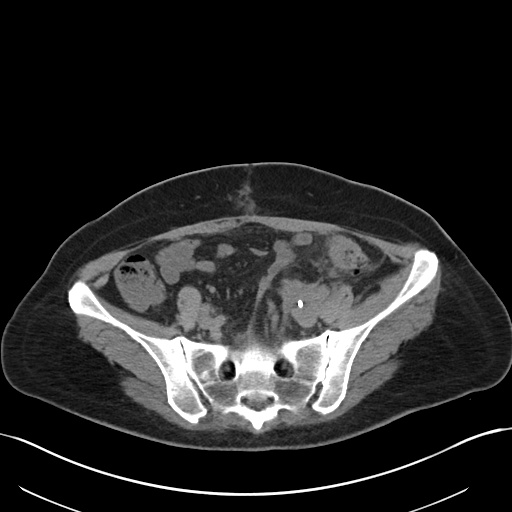
[im 43/89  soft-tissue]
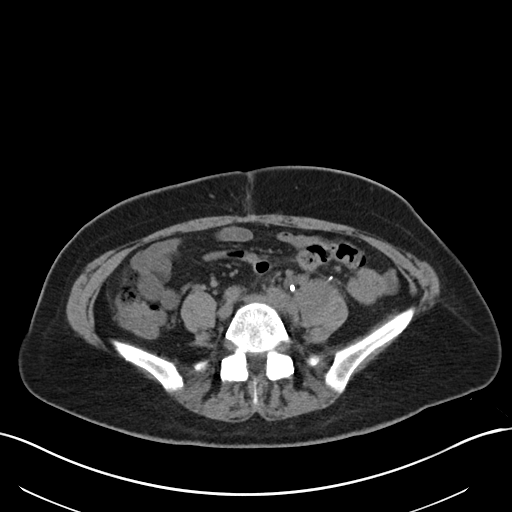
[im 50/89  soft-tissue]
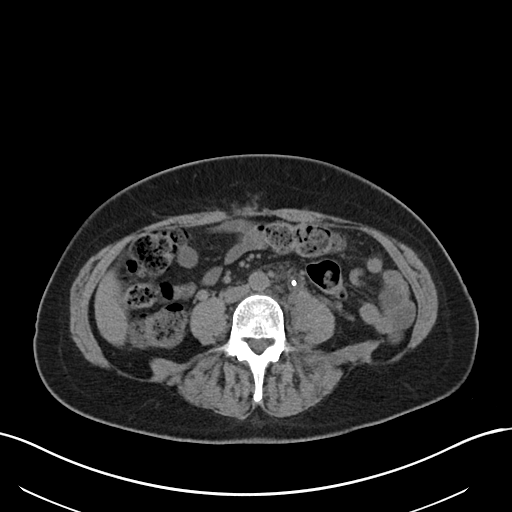
[im 57/89  soft-tissue]
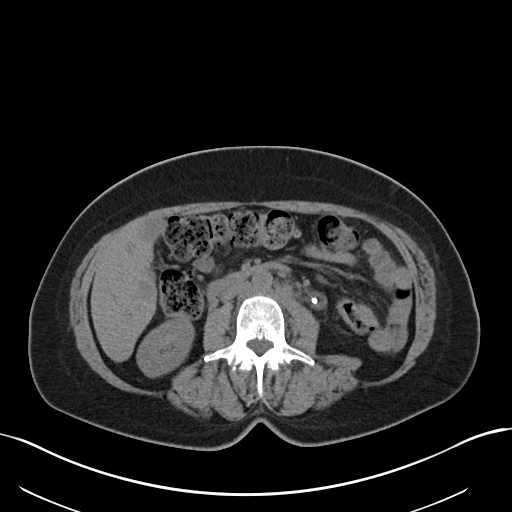
[im 64/89  soft-tissue]
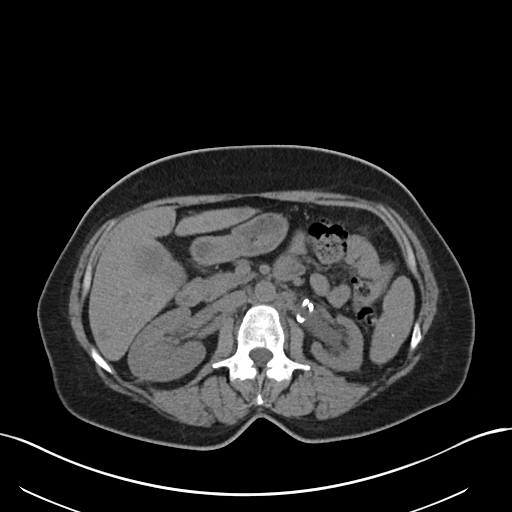
[im 64/89  bone]
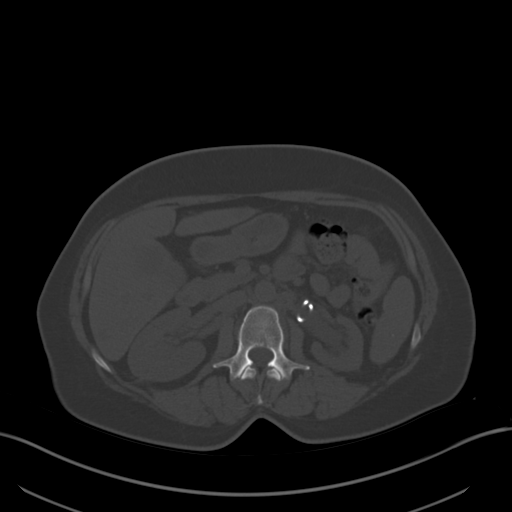
[im 71/89  soft-tissue]
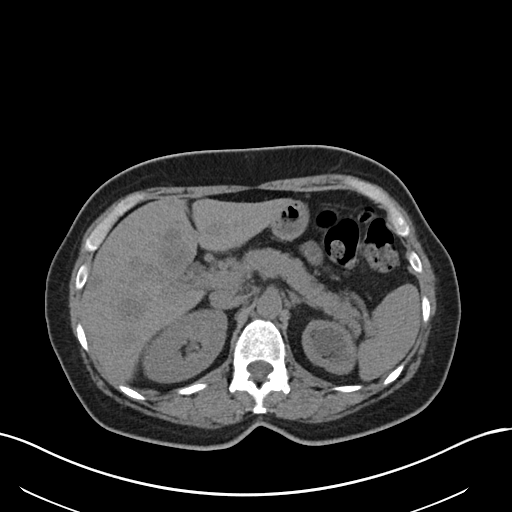
[im 78/89  soft-tissue]
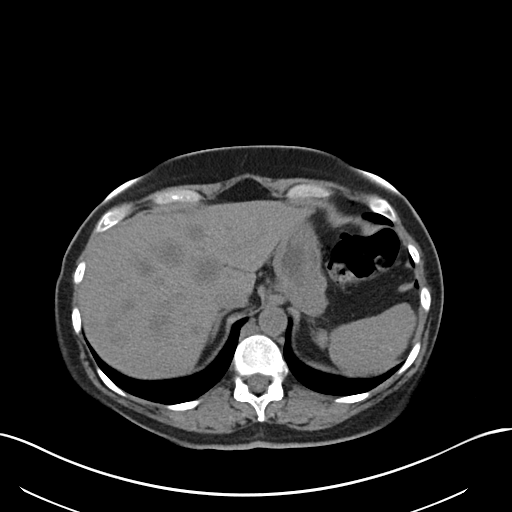
[im 85/89  soft-tissue]
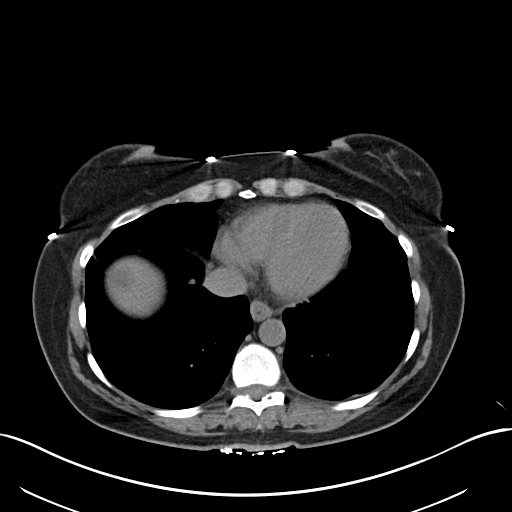

[Series 5: cor st · coronal · 0.92mm/px · 3 of 74 slices shown]
[im 25/74  soft-tissue]
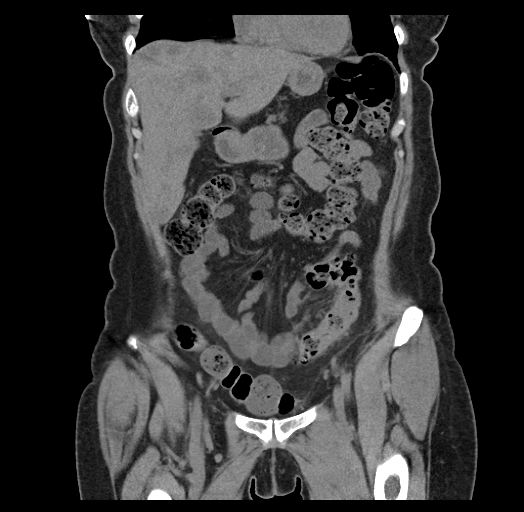
[im 33/74  soft-tissue]
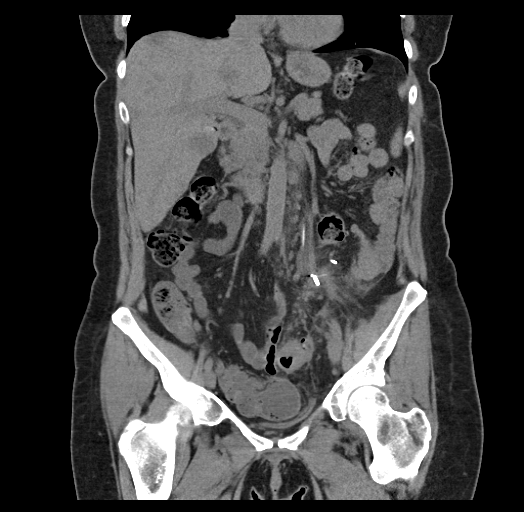
[im 41/74  soft-tissue]
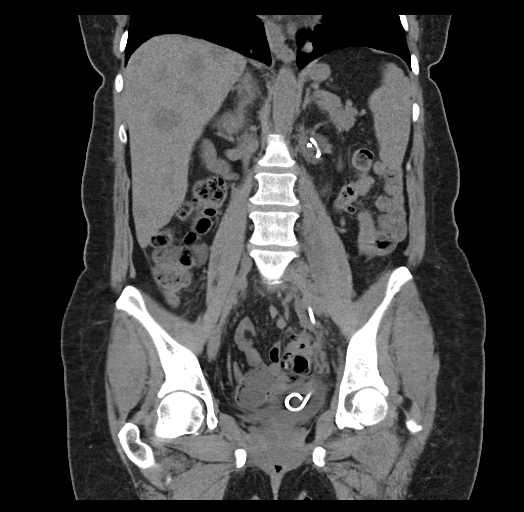

[15 of 46 positions shown; findings below may reference images not displayed]

FINDINGS: Lower chest: Pulmonary nodules which area decreased since
02/06/2015. For example, a lateral right lower lobe 5 mm nodule on
image/series [DATE] is decreased from 10 mm on the prior exam (when
remeasured). No lobar consolidation. Normal heart size without
pericardial or pleural effusion. Small hiatal hernia.

Hepatobiliary: Extensive hepatic metastasis. Suboptimally evaluated
on this noncontrast study. Index 2.2 cm medial segment left liver
lobe lesion on image/series [DATE] measured 2.2 cm on the prior PET.

A right hepatic lobe 2.4 cm lesion on image/series [DATE] is similar
to the prior exam (when remeasured). Cholecystectomy, without
biliary ductal dilatation.

Pancreas: Normal, without mass or ductal dilatation.

Spleen: Old granulomatous disease within the spleen.

Adrenals/Urinary Tract: Normal adrenal glands. Moderate left renal
cortical thinning with chronic left-sided hydroureteronephrosis. A
left-sided ureteric stent originates within the proximal left ureter
and terminates within a decompressed urinary bladder. No right-sided
hydronephrosis or hydroureter.

Stomach/Bowel: Normal remainder of the stomach. Extensive colonic
diverticulosis. Moderate pericolonic edema adjacent the sigmoid,
including on image/series [DATE]. This was similar on the prior
PET. No drainable abscess or free perforation. Colonic stool burden
suggests constipation. The cecum extends into the pelvis. The
appendix is likely identified within the central pelvis on
image/series [DATE].

Normal small bowel.

Vascular/Lymphatic: Retroperitoneal adenopathy. Index left
periaortic node measures 12 mm on image/series [DATE]. Compare 15 mm
on the prior PET. Soft tissue fullness within the high left
hemipelvis, posterior to a row of surgical clips could represent
adenopathy or residual adnexal mass. This is grossly similar to on
the prior exam. Measures on the order of 2.7 cm on image/series
[DATE]. Normal caliber of the aorta and branch vessels.

Reproductive: Hysterectomy.

Other: No significant free fluid.

Musculoskeletal: No acute osseous abnormality.
IMPRESSION: 1. No right-sided urinary tract calculi or hydronephrosis. Similar
appearance of the left-sided urinary tract with ureteric stent in
place, chronic hydronephrosis, and chronic cortical thinning.
2. Improvement in imaged pulmonary parenchymal and abdominal nodal
metastasis since 02/06/2015.
[DATE]. Similar hepatic metastasis since 02/06/2015
[DATE]. Edema adjacent the sigmoid colon, suspicious for diverticulitis.
Suboptimally evaluated secondary to stone study technique. This
edema was present on the prior exam, and could less likely be
postoperative.
5. Similar soft tissue fullness within the high left hemipelvis.
This could represent residual adnexal mass and/or adenopathy.

## 2015-11-08 DEATH — deceased

## 2016-04-18 IMAGING — CT NM PET TUM IMG RESTAG (PS) SKULL BASE T - THIGH
8 series · 25 of 25 positions shown · non-contrast
Comparison: PET-CT 02/06/2015

CLINICAL DATA: Subsequent treatment strategy for metastatic ovarian
cancer..

EXAM:
NUCLEAR MEDICINE PET SKULL BASE TO THIGH
TECHNIQUE: 6.27 mCi F-18 FDG was injected intravenously. Full-ring PET imaging
was performed from the skull base to thigh after the radiotracer. CT
data was obtained and used for attenuation correction and anatomic
localization.
FASTING BLOOD GLUCOSE:  Value: 100 mg/dl

[Series 3: pet sk_thigh ac · axial · 5.0mm · 4.07mm/px · z∈[-793,+79]mm · 4 of 219 slices shown]
[im 1/219]
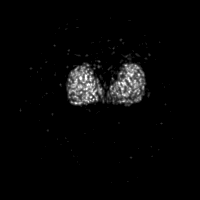
[im 73/219]
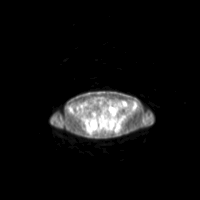
[im 146/219]
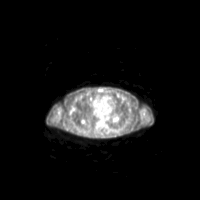
[im 219/219]
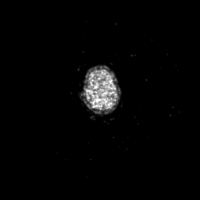

[Series 4: ct sk_thigh 5.0 b31f · axial · 5.0mm · 0.98mm/px · z∈[-793,+79]mm · 5 of 219 slices shown]
[im 1/219]
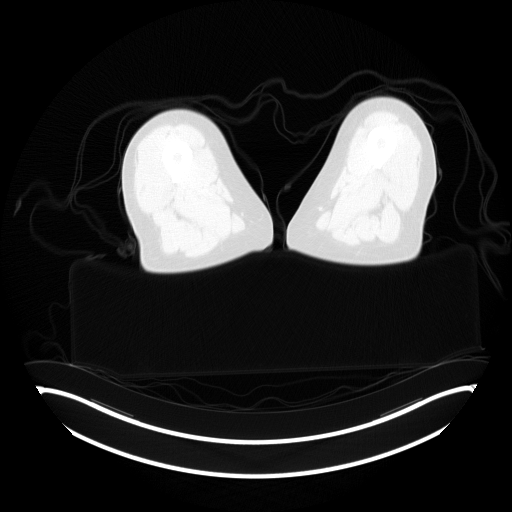
[im 55/219]
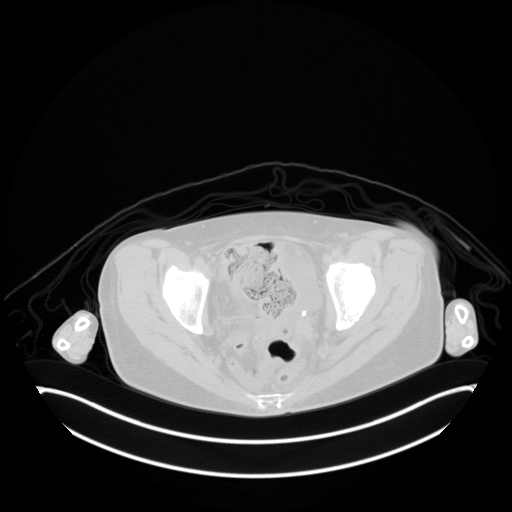
[im 110/219]
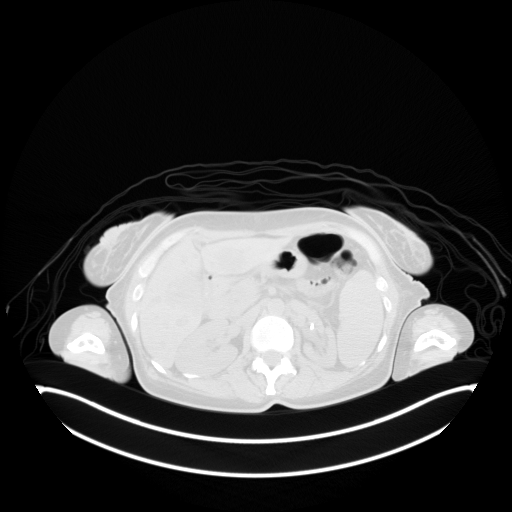
[im 164/219]
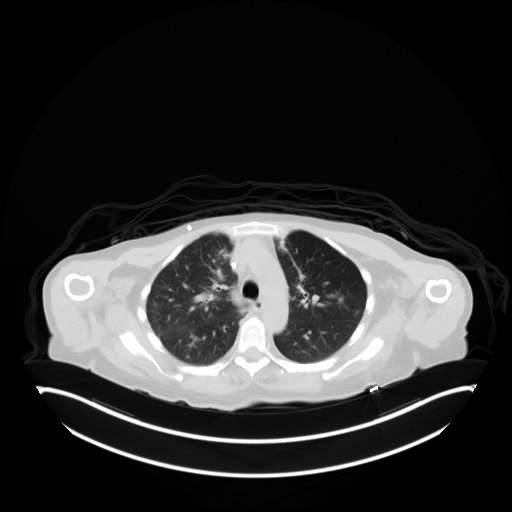
[im 219/219  brain]
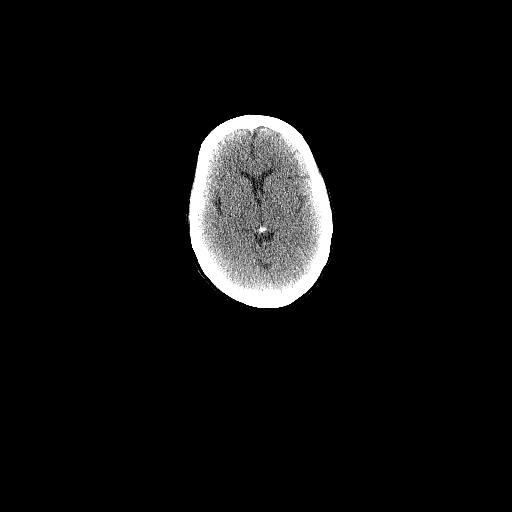

[Series 6: ct sk_thigh 5.0 b70f (id)_bone · axial · 5.0mm · 0.62mm/px · z∈[-315,-55]mm · 2 of 66 slices shown]
[im 1/66  bone]
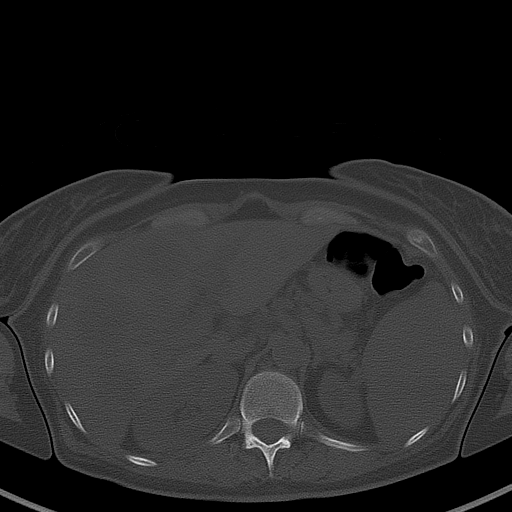
[im 66/66  bone]
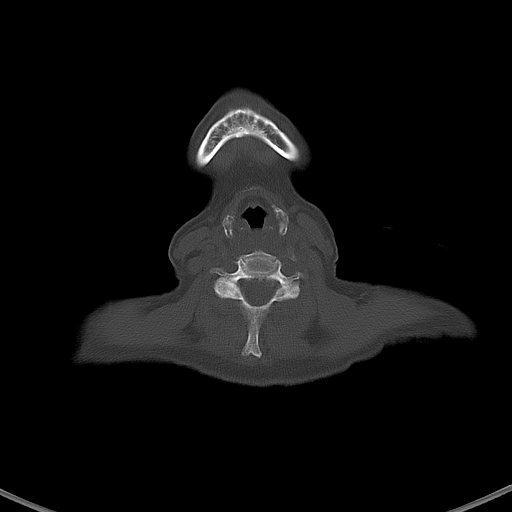

[Series 8: pet sk_thigh nac · axial · 5.0mm · 4.07mm/px · z∈[-793,+79]mm · 5 of 219 slices shown]
[im 1/219]
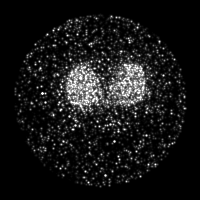
[im 55/219]
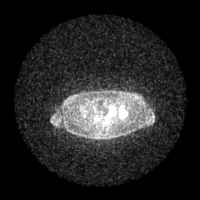
[im 110/219]
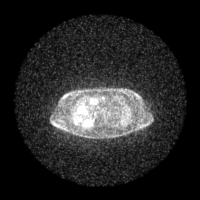
[im 164/219]
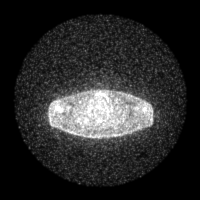
[im 219/219]
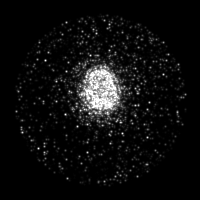

[Series 604: range-ct sk_thigh 5.0 (id)<alpha range> · 2 of 63 slices shown (1 of 2)]
[im 1/63]
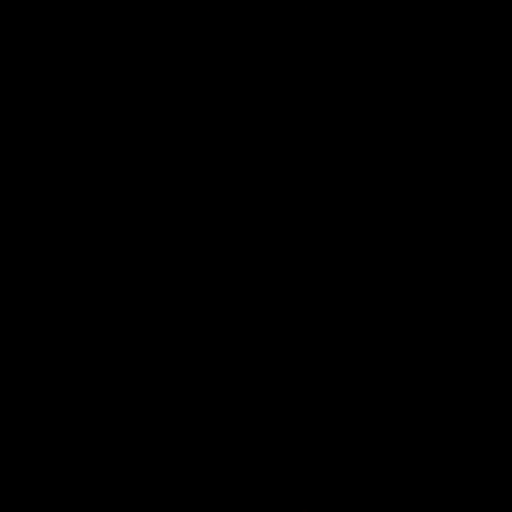
[im 63/63]
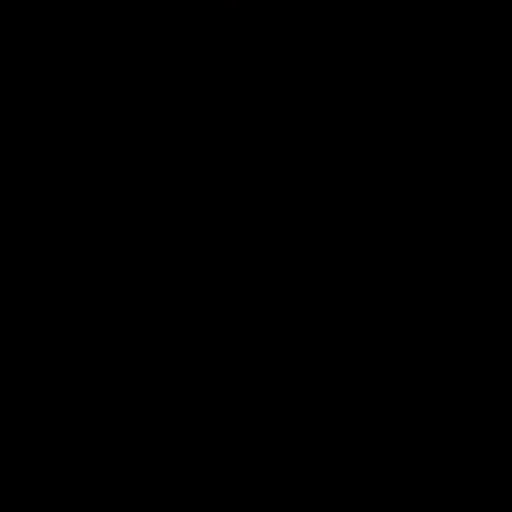

[Series 605: mip collection<mip range> · coronal · 1.81mm/px · 1 of 32 slices shown]
[im 1/32]
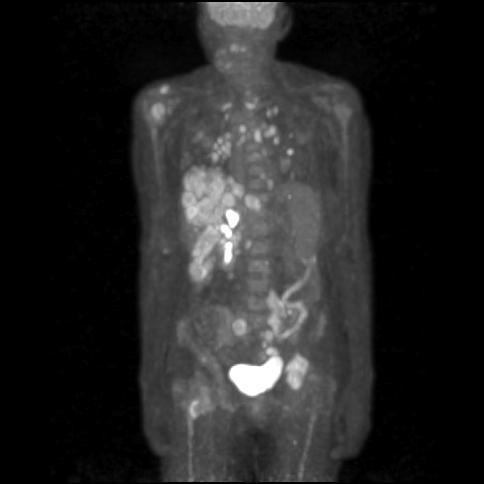

[Series 606: range-ct sk_thigh 5.0 (id)<alpha range> · 5 of 195 slices shown (2 of 2)]
[im 1/195]
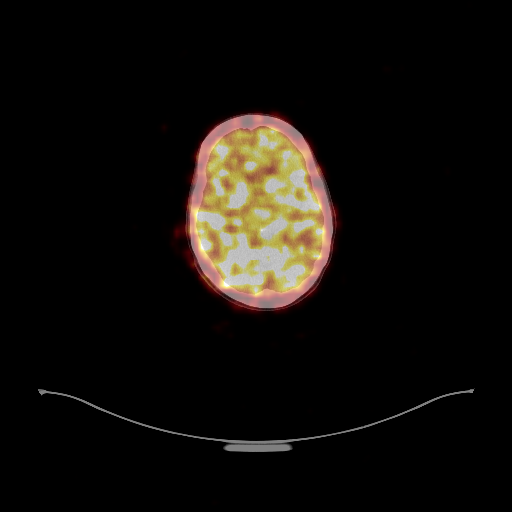
[im 49/195]
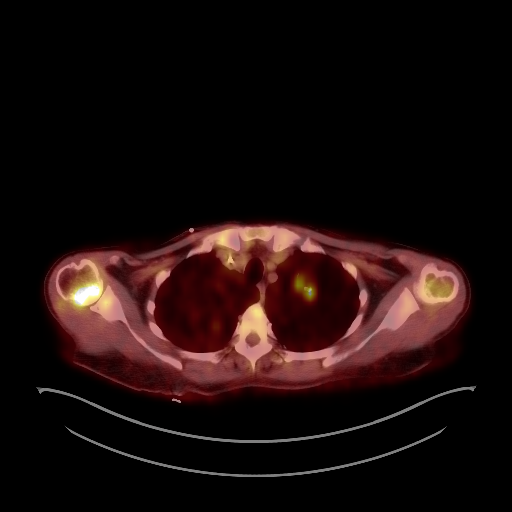
[im 98/195]
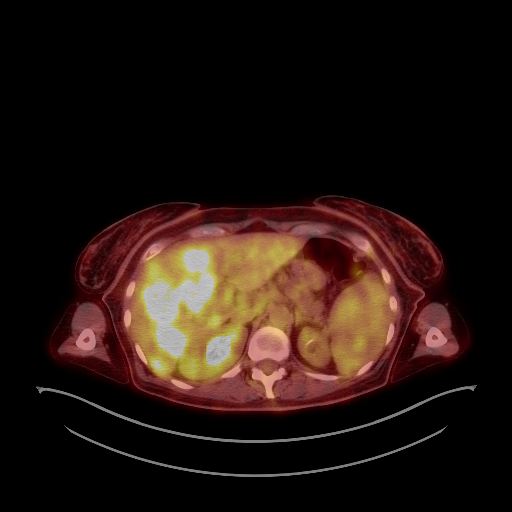
[im 146/195]
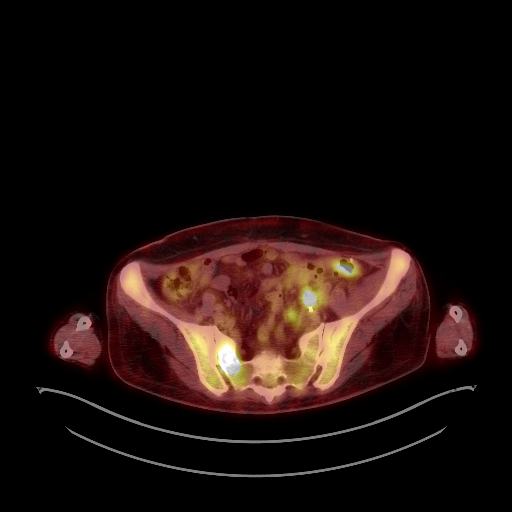
[im 195/195]
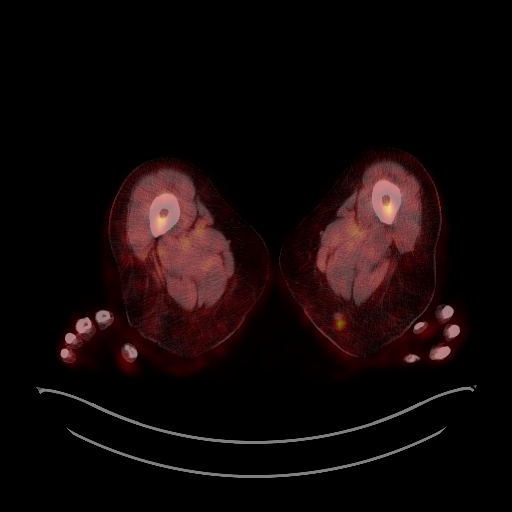

[Series 1060: results mm oncology reading · 1.06mm/px · 1 of 9 slices shown]
[im 1/9]
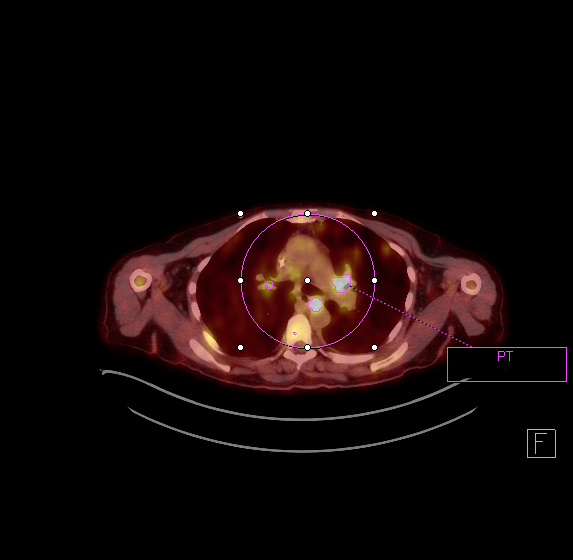

[25 of 25 positions shown; findings below may reference images not displayed]

FINDINGS: NECK

No hypermetabolic lymph nodes in the neck.

CHEST

Progressive metastatic disease involving the chest with diffuse
hypermetabolic mediastinal and hilar lymph nodes. SUV max is
approximately 9.7. New subcutaneous chest wall lesion on left side
measures 11 mm and SUV max is 11.8.

Progressive pulmonary metastatic disease. Right lower lobe pulmonary
nodule on image number 30 measures 17 mm and previously measured 14
mm. SUV max is 7.9 and was previously 5.0.

18 mm lung nodule medially at the left lung base previously measured
15 mm. SUV max is 5.5 and was previously 9.4.

17 mm right middle lobe nodule on image number 32 previously
measured 8 mm. SUV max is 9.7 and was previously 4.6.

ABDOMEN/PELVIS

Progressive hepatic metastatic disease with enlarging lesions in
both lobes. 5 cm segment 4B lesion on image number 96 previously
measured 2 cm. SUV max is 12.75 and was previously 10.6.

Confluent tumor along the left psoas muscle appears relatively
stable in size. Persistent hypermetabolism.

SKELETON

Diffuse osseous metastatic disease progressive since prior study.
IMPRESSION: Progressive metastatic disease involving the mediastinum, lungs,
liver and bones.

## 2016-07-24 ENCOUNTER — Encounter (HOSPITAL_COMMUNITY): Payer: Self-pay

## 2018-07-24 ENCOUNTER — Other Ambulatory Visit: Payer: Self-pay | Admitting: Nurse Practitioner
